# Patient Record
Sex: Female | Born: 1935 | Race: White | Hispanic: No | Marital: Married | Smoking: Never smoker
Health system: Southern US, Community
[De-identification: ages and names within clinical notes are randomized; demographics above are authoritative.]

## PROBLEM LIST (undated history)

## (undated) DIAGNOSIS — F32A Depression, unspecified: Secondary | ICD-10-CM

## (undated) DIAGNOSIS — F329 Major depressive disorder, single episode, unspecified: Secondary | ICD-10-CM

## (undated) DIAGNOSIS — G709 Myoneural disorder, unspecified: Secondary | ICD-10-CM

## (undated) DIAGNOSIS — R0602 Shortness of breath: Secondary | ICD-10-CM

## (undated) DIAGNOSIS — J45909 Unspecified asthma, uncomplicated: Secondary | ICD-10-CM

## (undated) DIAGNOSIS — M1990 Unspecified osteoarthritis, unspecified site: Secondary | ICD-10-CM

## (undated) DIAGNOSIS — D649 Anemia, unspecified: Secondary | ICD-10-CM

## (undated) DIAGNOSIS — C801 Malignant (primary) neoplasm, unspecified: Secondary | ICD-10-CM

## (undated) DIAGNOSIS — I10 Essential (primary) hypertension: Secondary | ICD-10-CM

## (undated) DIAGNOSIS — I272 Pulmonary hypertension, unspecified: Secondary | ICD-10-CM

## (undated) DIAGNOSIS — F458 Other somatoform disorders: Secondary | ICD-10-CM

## (undated) DIAGNOSIS — G4730 Sleep apnea, unspecified: Secondary | ICD-10-CM

## (undated) HISTORY — PX: BLEPHAROPLASTY: SUR158

## (undated) HISTORY — PX: JOINT REPLACEMENT: SHX530

## (undated) HISTORY — PX: ABDOMINAL HYSTERECTOMY: SHX81

## (undated) HISTORY — PX: TONSILLECTOMY: SUR1361

---

## 2001-11-11 HISTORY — PX: BACK SURGERY: SHX140

## 2002-01-20 ENCOUNTER — Encounter: Admission: RE | Admit: 2002-01-20 | Discharge: 2002-01-20 | Payer: Self-pay | Admitting: Neurosurgery

## 2002-01-20 ENCOUNTER — Encounter: Payer: Self-pay | Admitting: Neurosurgery

## 2002-02-01 ENCOUNTER — Encounter: Payer: Self-pay | Admitting: Neurosurgery

## 2002-02-01 ENCOUNTER — Encounter: Admission: RE | Admit: 2002-02-01 | Discharge: 2002-02-01 | Payer: Self-pay | Admitting: Neurosurgery

## 2002-02-23 ENCOUNTER — Encounter: Admission: RE | Admit: 2002-02-23 | Discharge: 2002-02-23 | Payer: Self-pay | Admitting: Neurosurgery

## 2002-02-23 ENCOUNTER — Encounter: Payer: Self-pay | Admitting: Neurosurgery

## 2002-04-20 ENCOUNTER — Encounter: Payer: Self-pay | Admitting: Neurosurgery

## 2002-04-26 ENCOUNTER — Encounter: Payer: Self-pay | Admitting: Neurosurgery

## 2002-04-26 ENCOUNTER — Inpatient Hospital Stay (HOSPITAL_COMMUNITY): Admission: RE | Admit: 2002-04-26 | Discharge: 2002-04-27 | Payer: Self-pay | Admitting: Neurosurgery

## 2002-07-30 ENCOUNTER — Encounter: Admission: RE | Admit: 2002-07-30 | Discharge: 2002-07-30 | Payer: Self-pay | Admitting: Neurosurgery

## 2002-07-30 ENCOUNTER — Encounter: Admission: RE | Admit: 2002-07-30 | Discharge: 2002-07-30 | Payer: Self-pay | Admitting: Orthopedic Surgery

## 2002-07-30 ENCOUNTER — Encounter: Payer: Self-pay | Admitting: Neurosurgery

## 2002-08-12 ENCOUNTER — Encounter: Admission: RE | Admit: 2002-08-12 | Discharge: 2002-08-12 | Payer: Self-pay | Admitting: Neurosurgery

## 2002-08-12 ENCOUNTER — Encounter: Payer: Self-pay | Admitting: Neurosurgery

## 2002-11-22 ENCOUNTER — Encounter: Payer: Self-pay | Admitting: Neurosurgery

## 2002-11-22 ENCOUNTER — Encounter: Admission: RE | Admit: 2002-11-22 | Discharge: 2002-11-22 | Payer: Self-pay | Admitting: Neurosurgery

## 2004-11-11 HISTORY — PX: KNEE SURGERY: SHX244

## 2005-03-12 ENCOUNTER — Ambulatory Visit: Payer: Self-pay | Admitting: Internal Medicine

## 2005-08-26 ENCOUNTER — Inpatient Hospital Stay: Payer: Self-pay | Admitting: General Practice

## 2005-10-16 ENCOUNTER — Ambulatory Visit: Payer: Self-pay | Admitting: General Practice

## 2006-03-24 ENCOUNTER — Ambulatory Visit: Payer: Self-pay | Admitting: Internal Medicine

## 2006-07-29 ENCOUNTER — Ambulatory Visit: Payer: Self-pay

## 2006-09-04 ENCOUNTER — Ambulatory Visit: Payer: Self-pay | Admitting: Pain Medicine

## 2006-09-10 ENCOUNTER — Ambulatory Visit: Payer: Self-pay | Admitting: Pain Medicine

## 2006-10-16 ENCOUNTER — Ambulatory Visit: Payer: Self-pay | Admitting: Pain Medicine

## 2006-10-22 ENCOUNTER — Ambulatory Visit: Payer: Self-pay | Admitting: Pain Medicine

## 2007-01-08 ENCOUNTER — Ambulatory Visit: Payer: Self-pay | Admitting: Pain Medicine

## 2007-05-07 ENCOUNTER — Ambulatory Visit: Payer: Self-pay | Admitting: Internal Medicine

## 2008-05-10 ENCOUNTER — Ambulatory Visit: Payer: Self-pay | Admitting: Internal Medicine

## 2008-05-23 ENCOUNTER — Ambulatory Visit: Payer: Self-pay | Admitting: Internal Medicine

## 2008-11-11 DIAGNOSIS — C801 Malignant (primary) neoplasm, unspecified: Secondary | ICD-10-CM

## 2008-11-11 HISTORY — DX: Malignant (primary) neoplasm, unspecified: C80.1

## 2008-11-25 ENCOUNTER — Ambulatory Visit: Payer: Self-pay | Admitting: Surgery

## 2009-01-30 ENCOUNTER — Ambulatory Visit: Payer: Self-pay | Admitting: Internal Medicine

## 2009-01-31 ENCOUNTER — Ambulatory Visit: Payer: Self-pay | Admitting: Internal Medicine

## 2009-02-09 ENCOUNTER — Ambulatory Visit: Payer: Self-pay | Admitting: Gynecologic Oncology

## 2009-02-14 ENCOUNTER — Ambulatory Visit: Payer: Self-pay | Admitting: Gynecologic Oncology

## 2009-02-20 ENCOUNTER — Inpatient Hospital Stay: Payer: Self-pay | Admitting: Internal Medicine

## 2009-03-11 ENCOUNTER — Ambulatory Visit: Payer: Self-pay | Admitting: Gynecologic Oncology

## 2009-03-13 ENCOUNTER — Ambulatory Visit: Payer: Self-pay | Admitting: Surgery

## 2009-03-15 ENCOUNTER — Ambulatory Visit: Payer: Self-pay | Admitting: Oncology

## 2009-04-11 ENCOUNTER — Ambulatory Visit: Payer: Self-pay | Admitting: Oncology

## 2009-04-11 ENCOUNTER — Ambulatory Visit: Payer: Self-pay | Admitting: Gynecologic Oncology

## 2009-05-11 ENCOUNTER — Ambulatory Visit: Payer: Self-pay | Admitting: Gynecologic Oncology

## 2009-05-11 ENCOUNTER — Ambulatory Visit: Payer: Self-pay | Admitting: Oncology

## 2009-05-22 ENCOUNTER — Ambulatory Visit: Payer: Self-pay | Admitting: Internal Medicine

## 2009-06-11 ENCOUNTER — Ambulatory Visit: Payer: Self-pay | Admitting: Oncology

## 2009-06-11 ENCOUNTER — Ambulatory Visit: Payer: Self-pay | Admitting: Gynecologic Oncology

## 2009-07-12 ENCOUNTER — Ambulatory Visit: Payer: Self-pay | Admitting: Oncology

## 2009-07-12 ENCOUNTER — Ambulatory Visit: Payer: Self-pay | Admitting: Gynecologic Oncology

## 2009-08-11 ENCOUNTER — Ambulatory Visit: Payer: Self-pay | Admitting: Gynecologic Oncology

## 2009-08-11 ENCOUNTER — Ambulatory Visit: Payer: Self-pay | Admitting: Oncology

## 2009-09-11 ENCOUNTER — Ambulatory Visit: Payer: Self-pay | Admitting: Gynecologic Oncology

## 2009-09-11 ENCOUNTER — Ambulatory Visit: Payer: Self-pay | Admitting: Oncology

## 2009-10-11 ENCOUNTER — Ambulatory Visit: Payer: Self-pay | Admitting: Oncology

## 2009-11-11 ENCOUNTER — Ambulatory Visit: Payer: Self-pay | Admitting: Oncology

## 2009-12-12 ENCOUNTER — Ambulatory Visit: Payer: Self-pay | Admitting: Oncology

## 2009-12-25 ENCOUNTER — Ambulatory Visit: Payer: Self-pay | Admitting: Oncology

## 2010-01-09 ENCOUNTER — Ambulatory Visit: Payer: Self-pay | Admitting: Oncology

## 2010-02-09 ENCOUNTER — Ambulatory Visit: Payer: Self-pay | Admitting: Oncology

## 2010-03-11 ENCOUNTER — Ambulatory Visit: Payer: Self-pay | Admitting: Oncology

## 2010-03-19 ENCOUNTER — Ambulatory Visit: Payer: Self-pay | Admitting: Oncology

## 2010-03-26 ENCOUNTER — Ambulatory Visit: Payer: Self-pay | Admitting: Oncology

## 2010-04-11 ENCOUNTER — Ambulatory Visit: Payer: Self-pay | Admitting: Oncology

## 2010-04-25 ENCOUNTER — Ambulatory Visit: Payer: Self-pay | Admitting: Internal Medicine

## 2010-05-16 ENCOUNTER — Ambulatory Visit: Payer: Self-pay | Admitting: Pain Medicine

## 2010-05-29 ENCOUNTER — Ambulatory Visit: Payer: Self-pay | Admitting: Pain Medicine

## 2010-06-05 ENCOUNTER — Ambulatory Visit: Payer: Self-pay | Admitting: Internal Medicine

## 2010-06-11 ENCOUNTER — Ambulatory Visit: Payer: Self-pay | Admitting: Pain Medicine

## 2010-06-26 ENCOUNTER — Ambulatory Visit: Payer: Self-pay | Admitting: Oncology

## 2010-06-26 ENCOUNTER — Ambulatory Visit: Payer: Self-pay | Admitting: Gynecologic Oncology

## 2010-06-28 LAB — CA 125: CA 125: 13.5 U/mL (ref 0.0–34.0)

## 2010-07-10 ENCOUNTER — Ambulatory Visit: Payer: Self-pay | Admitting: Pain Medicine

## 2010-07-12 ENCOUNTER — Ambulatory Visit: Payer: Self-pay | Admitting: Gynecologic Oncology

## 2010-07-24 ENCOUNTER — Ambulatory Visit: Payer: Self-pay | Admitting: Pain Medicine

## 2010-08-06 ENCOUNTER — Ambulatory Visit: Payer: Self-pay | Admitting: Pain Medicine

## 2010-09-27 ENCOUNTER — Ambulatory Visit: Payer: Self-pay | Admitting: Gynecologic Oncology

## 2010-10-02 ENCOUNTER — Ambulatory Visit: Payer: Self-pay | Admitting: Oncology

## 2010-10-07 ENCOUNTER — Observation Stay: Payer: Self-pay | Admitting: General Surgery

## 2010-10-11 ENCOUNTER — Ambulatory Visit: Payer: Self-pay | Admitting: Oncology

## 2010-12-21 ENCOUNTER — Inpatient Hospital Stay: Payer: Self-pay | Admitting: Internal Medicine

## 2011-01-03 ENCOUNTER — Ambulatory Visit: Payer: Self-pay | Admitting: Oncology

## 2011-01-10 ENCOUNTER — Ambulatory Visit: Payer: Self-pay | Admitting: Oncology

## 2011-03-19 ENCOUNTER — Ambulatory Visit: Payer: Self-pay | Admitting: Gastroenterology

## 2011-03-21 ENCOUNTER — Ambulatory Visit: Payer: Self-pay | Admitting: Pain Medicine

## 2011-04-01 ENCOUNTER — Other Ambulatory Visit: Payer: Self-pay | Admitting: Pain Medicine

## 2011-04-02 ENCOUNTER — Ambulatory Visit: Payer: Self-pay | Admitting: Oncology

## 2011-04-10 ENCOUNTER — Ambulatory Visit: Payer: Self-pay | Admitting: Pain Medicine

## 2011-04-12 ENCOUNTER — Ambulatory Visit: Payer: Self-pay | Admitting: Oncology

## 2011-05-13 ENCOUNTER — Encounter: Payer: Self-pay | Admitting: Internal Medicine

## 2011-06-05 ENCOUNTER — Other Ambulatory Visit: Payer: Self-pay | Admitting: Pain Medicine

## 2011-06-10 ENCOUNTER — Ambulatory Visit: Payer: Self-pay | Admitting: Pain Medicine

## 2011-06-19 ENCOUNTER — Ambulatory Visit: Payer: Self-pay | Admitting: Pain Medicine

## 2011-06-20 ENCOUNTER — Ambulatory Visit: Payer: Self-pay | Admitting: Pain Medicine

## 2011-07-04 ENCOUNTER — Ambulatory Visit: Payer: Self-pay | Admitting: Oncology

## 2011-07-05 LAB — CA 125: CA 125: 13.6 U/mL (ref 0.0–34.0)

## 2011-07-08 ENCOUNTER — Ambulatory Visit: Payer: Self-pay | Admitting: Pain Medicine

## 2011-07-09 ENCOUNTER — Ambulatory Visit: Payer: Self-pay | Admitting: Pain Medicine

## 2011-07-13 ENCOUNTER — Ambulatory Visit: Payer: Self-pay | Admitting: Oncology

## 2011-07-24 ENCOUNTER — Ambulatory Visit: Payer: Self-pay | Admitting: Pain Medicine

## 2011-09-10 ENCOUNTER — Ambulatory Visit: Payer: Self-pay | Admitting: Internal Medicine

## 2011-10-08 ENCOUNTER — Ambulatory Visit: Payer: Self-pay | Admitting: Oncology

## 2011-10-12 ENCOUNTER — Ambulatory Visit: Payer: Self-pay | Admitting: Oncology

## 2011-11-12 ENCOUNTER — Ambulatory Visit: Payer: Self-pay | Admitting: Oncology

## 2011-11-12 HISTORY — PX: TOTAL HIP ARTHROPLASTY: SHX124

## 2011-11-12 HISTORY — PX: LAPAROTOMY: SHX154

## 2011-12-12 ENCOUNTER — Ambulatory Visit: Payer: Self-pay | Admitting: Pain Medicine

## 2012-01-01 ENCOUNTER — Ambulatory Visit: Payer: Self-pay | Admitting: Pain Medicine

## 2012-01-02 ENCOUNTER — Ambulatory Visit: Payer: Self-pay | Admitting: Pain Medicine

## 2012-01-06 ENCOUNTER — Ambulatory Visit: Payer: Self-pay | Admitting: Oncology

## 2012-01-06 LAB — CBC CANCER CENTER
Basophil #: 0 x10 3/mm (ref 0.0–0.1)
Basophil %: 0.1 %
HCT: 37.9 % (ref 35.0–47.0)
HGB: 12.9 g/dL (ref 12.0–16.0)
Lymphocyte %: 12.6 %
MCHC: 33.9 g/dL (ref 32.0–36.0)
Monocyte %: 5.8 %
Neutrophil #: 7.1 x10 3/mm — ABNORMAL HIGH (ref 1.4–6.5)
Neutrophil %: 80.4 %
RDW: 13.8 % (ref 11.5–14.5)
WBC: 8.8 x10 3/mm (ref 3.6–11.0)

## 2012-01-06 LAB — COMPREHENSIVE METABOLIC PANEL
Albumin: 3.9 g/dL (ref 3.4–5.0)
BUN: 37 mg/dL — ABNORMAL HIGH (ref 7–18)
Bilirubin,Total: 0.6 mg/dL (ref 0.2–1.0)
Calcium, Total: 9.1 mg/dL (ref 8.5–10.1)
Chloride: 98 mmol/L (ref 98–107)
Co2: 33 mmol/L — ABNORMAL HIGH (ref 21–32)
EGFR (African American): 59 — ABNORMAL LOW
Glucose: 113 mg/dL — ABNORMAL HIGH (ref 65–99)
SGOT(AST): 19 U/L (ref 15–37)
SGPT (ALT): 32 U/L
Total Protein: 8.1 g/dL (ref 6.4–8.2)

## 2012-01-07 LAB — CA 125: CA 125: 15.3 U/mL (ref 0.0–34.0)

## 2012-01-10 ENCOUNTER — Ambulatory Visit: Payer: Self-pay | Admitting: Oncology

## 2012-02-20 ENCOUNTER — Ambulatory Visit: Payer: Self-pay | Admitting: Pain Medicine

## 2012-04-07 ENCOUNTER — Ambulatory Visit: Payer: Self-pay | Admitting: Oncology

## 2012-04-08 LAB — CA 125: CA 125: 14.9 U/mL (ref 0.0–34.0)

## 2012-04-11 ENCOUNTER — Ambulatory Visit: Payer: Self-pay | Admitting: Oncology

## 2012-04-20 ENCOUNTER — Ambulatory Visit: Payer: Self-pay | Admitting: General Practice

## 2012-04-20 LAB — BASIC METABOLIC PANEL
BUN: 27 mg/dL — ABNORMAL HIGH (ref 7–18)
Calcium, Total: 9.5 mg/dL (ref 8.5–10.1)
Chloride: 97 mmol/L — ABNORMAL LOW (ref 98–107)
Co2: 34 mmol/L — ABNORMAL HIGH (ref 21–32)
Creatinine: 1.05 mg/dL (ref 0.60–1.30)
EGFR (Non-African Amer.): 52 — ABNORMAL LOW
Glucose: 117 mg/dL — ABNORMAL HIGH (ref 65–99)
Osmolality: 282 (ref 275–301)
Potassium: 3.3 mmol/L — ABNORMAL LOW (ref 3.5–5.1)

## 2012-04-20 LAB — URINALYSIS, COMPLETE
Bilirubin,UR: NEGATIVE
Blood: NEGATIVE
Glucose,UR: NEGATIVE mg/dL (ref 0–75)
Leukocyte Esterase: NEGATIVE
Nitrite: NEGATIVE
Ph: 5 (ref 4.5–8.0)
Protein: NEGATIVE
Squamous Epithelial: <1
WBC UR: 1 /HPF (ref 0–5)

## 2012-04-20 LAB — CBC
HCT: 38.8 % (ref 35.0–47.0)
MCH: 29.9 pg (ref 26.0–34.0)
MCV: 91 fL (ref 80–100)
Platelet: 228 10*3/uL (ref 150–440)
RDW: 13.3 % (ref 11.5–14.5)

## 2012-04-20 LAB — MRSA PCR SCREENING

## 2012-04-20 LAB — APTT: Activated PTT: 34.6 secs (ref 23.6–35.9)

## 2012-05-04 ENCOUNTER — Inpatient Hospital Stay: Payer: Self-pay | Admitting: General Practice

## 2012-05-04 LAB — POTASSIUM: Potassium: 3.7 mmol/L (ref 3.5–5.1)

## 2012-05-05 LAB — HEMOGLOBIN: HGB: 9.3 g/dL — ABNORMAL LOW (ref 12.0–16.0)

## 2012-05-05 LAB — BASIC METABOLIC PANEL
Calcium, Total: 8.3 mg/dL — ABNORMAL LOW (ref 8.5–10.1)
Creatinine: 1.14 mg/dL (ref 0.60–1.30)
EGFR (African American): 54 — ABNORMAL LOW
EGFR (Non-African Amer.): 47 — ABNORMAL LOW
Osmolality: 275 (ref 275–301)
Potassium: 4.1 mmol/L (ref 3.5–5.1)
Sodium: 136 mmol/L (ref 136–145)

## 2012-05-06 LAB — BASIC METABOLIC PANEL
BUN: 21 mg/dL — ABNORMAL HIGH (ref 7–18)
Calcium, Total: 7.8 mg/dL — ABNORMAL LOW (ref 8.5–10.1)
Chloride: 100 mmol/L (ref 98–107)
Creatinine: 0.99 mg/dL (ref 0.60–1.30)
EGFR (Non-African Amer.): 56 — ABNORMAL LOW
Glucose: 135 mg/dL — ABNORMAL HIGH (ref 65–99)

## 2012-05-06 LAB — HEMOGLOBIN: HGB: 8.7 g/dL — ABNORMAL LOW (ref 12.0–16.0)

## 2012-05-06 LAB — PLATELET COUNT: Platelet: 174 10*3/uL (ref 150–440)

## 2012-05-06 LAB — PATHOLOGY REPORT

## 2012-05-07 LAB — URINALYSIS, COMPLETE
Glucose,UR: NEGATIVE mg/dL (ref 0–75)
Ketone: NEGATIVE
Nitrite: NEGATIVE
Ph: 7 (ref 4.5–8.0)
Protein: NEGATIVE
RBC,UR: 1 /HPF (ref 0–5)
Specific Gravity: 1.012 (ref 1.003–1.030)
Transitional Epi: 1
WBC UR: 9 /HPF (ref 0–5)

## 2012-05-08 ENCOUNTER — Encounter: Payer: Self-pay | Admitting: Internal Medicine

## 2012-05-11 ENCOUNTER — Encounter: Payer: Self-pay | Admitting: Internal Medicine

## 2012-05-12 LAB — BASIC METABOLIC PANEL
Anion Gap: 6 — ABNORMAL LOW (ref 7–16)
Calcium, Total: 8.6 mg/dL (ref 8.5–10.1)
Chloride: 96 mmol/L — ABNORMAL LOW (ref 98–107)
Co2: 36 mmol/L — ABNORMAL HIGH (ref 21–32)
Creatinine: 1.18 mg/dL (ref 0.60–1.30)
EGFR (African American): 52 — ABNORMAL LOW
Potassium: 3.7 mmol/L (ref 3.5–5.1)
Sodium: 138 mmol/L (ref 136–145)

## 2012-07-03 ENCOUNTER — Ambulatory Visit: Payer: Self-pay | Admitting: Oncology

## 2012-07-03 LAB — COMPREHENSIVE METABOLIC PANEL
Bilirubin,Total: 0.2 mg/dL (ref 0.2–1.0)
Chloride: 102 mmol/L (ref 98–107)
Co2: 34 mmol/L — ABNORMAL HIGH (ref 21–32)
Creatinine: 1.07 mg/dL (ref 0.60–1.30)
EGFR (African American): 58 — ABNORMAL LOW
EGFR (Non-African Amer.): 50 — ABNORMAL LOW
Glucose: 87 mg/dL (ref 65–99)
Osmolality: 283 (ref 275–301)
Potassium: 3.7 mmol/L (ref 3.5–5.1)
Sodium: 140 mmol/L (ref 136–145)

## 2012-07-03 LAB — CBC CANCER CENTER
Basophil %: 0.7 %
HCT: 34.4 % — ABNORMAL LOW (ref 35.0–47.0)
HGB: 11.2 g/dL — ABNORMAL LOW (ref 12.0–16.0)
Lymphocyte %: 21.9 %
MCV: 89 fL (ref 80–100)
Monocyte %: 8.1 %
Neutrophil #: 3.4 x10 3/mm (ref 1.4–6.5)
WBC: 5.4 x10 3/mm (ref 3.6–11.0)

## 2012-07-12 ENCOUNTER — Ambulatory Visit: Payer: Self-pay | Admitting: Oncology

## 2012-07-14 LAB — CBC CANCER CENTER
Basophil #: 0 x10 3/mm (ref 0.0–0.1)
Basophil %: 0.5 %
Eosinophil #: 0.3 x10 3/mm (ref 0.0–0.7)
Eosinophil %: 5.4 %
HGB: 10.8 g/dL — ABNORMAL LOW (ref 12.0–16.0)
Lymphocyte %: 19.4 %
MCHC: 31.8 g/dL — ABNORMAL LOW (ref 32.0–36.0)
Monocyte %: 6.5 %
Neutrophil #: 4.4 x10 3/mm (ref 1.4–6.5)
Neutrophil %: 68.2 %
RDW: 14.4 % (ref 11.5–14.5)
WBC: 6.4 x10 3/mm (ref 3.6–11.0)

## 2012-07-14 LAB — COMPREHENSIVE METABOLIC PANEL
Albumin: 3.2 g/dL — ABNORMAL LOW (ref 3.4–5.0)
Alkaline Phosphatase: 82 U/L (ref 50–136)
Anion Gap: 6 — ABNORMAL LOW (ref 7–16)
BUN: 23 mg/dL — ABNORMAL HIGH (ref 7–18)
Bilirubin,Total: 0.2 mg/dL (ref 0.2–1.0)
Creatinine: 0.94 mg/dL (ref 0.60–1.30)
EGFR (African American): >60
Glucose: 103 mg/dL — ABNORMAL HIGH (ref 65–99)
Potassium: 4.1 mmol/L (ref 3.5–5.1)
SGOT(AST): 17 U/L (ref 15–37)
SGPT (ALT): 21 U/L (ref 12–78)
Total Protein: 7.3 g/dL (ref 6.4–8.2)

## 2012-07-14 LAB — PROTIME-INR: INR: 1

## 2012-07-17 LAB — CA 125: CA 125: 15.4 U/mL (ref 0.0–34.0)

## 2012-07-20 ENCOUNTER — Other Ambulatory Visit: Payer: Self-pay

## 2012-07-20 ENCOUNTER — Telehealth: Payer: Self-pay

## 2012-07-20 DIAGNOSIS — R590 Localized enlarged lymph nodes: Secondary | ICD-10-CM

## 2012-07-20 NOTE — Telephone Encounter (Signed)
Pt has been scheduled for 08/06/12 1030 am need to review meds and instruct pt

## 2012-07-20 NOTE — Telephone Encounter (Signed)
Pt has been instructed and meds reviewed.  She will call with any questions or concerns 

## 2012-07-29 ENCOUNTER — Telehealth: Payer: Self-pay

## 2012-07-29 NOTE — Telephone Encounter (Signed)
eus appt change to 08/20/12 1130 am

## 2012-07-29 NOTE — Telephone Encounter (Signed)
Pt has been re instructed with new appt date and time.

## 2012-07-30 ENCOUNTER — Telehealth: Payer: Self-pay | Admitting: Gastroenterology

## 2012-07-30 NOTE — Telephone Encounter (Signed)
Pt had questions about her procedure at the hospital all questions were answered and she will call with any concerns

## 2012-08-11 ENCOUNTER — Ambulatory Visit: Payer: Self-pay | Admitting: Oncology

## 2012-08-20 ENCOUNTER — Encounter (HOSPITAL_COMMUNITY): Payer: Self-pay | Admitting: Anesthesiology

## 2012-08-20 ENCOUNTER — Ambulatory Visit (HOSPITAL_COMMUNITY)
Admission: RE | Admit: 2012-08-20 | Discharge: 2012-08-20 | Disposition: A | Discharge status: Home / Self Care | Payer: Medicare Other | Source: Ambulatory Visit | Attending: Gastroenterology | Admitting: Gastroenterology

## 2012-08-20 ENCOUNTER — Encounter (HOSPITAL_COMMUNITY): Payer: Self-pay

## 2012-08-20 ENCOUNTER — Ambulatory Visit (HOSPITAL_COMMUNITY): Payer: Medicare Other | Admitting: Anesthesiology

## 2012-08-20 ENCOUNTER — Encounter (HOSPITAL_COMMUNITY)
Admission: RE | Disposition: A | Discharge status: Home / Self Care | Payer: Self-pay | Source: Ambulatory Visit | Attending: Gastroenterology

## 2012-08-20 DIAGNOSIS — R599 Enlarged lymph nodes, unspecified: Secondary | ICD-10-CM

## 2012-08-20 DIAGNOSIS — R590 Localized enlarged lymph nodes: Secondary | ICD-10-CM

## 2012-08-20 DIAGNOSIS — C569 Malignant neoplasm of unspecified ovary: Secondary | ICD-10-CM | POA: Insufficient documentation

## 2012-08-20 DIAGNOSIS — D649 Anemia, unspecified: Secondary | ICD-10-CM | POA: Insufficient documentation

## 2012-08-20 DIAGNOSIS — I10 Essential (primary) hypertension: Secondary | ICD-10-CM | POA: Insufficient documentation

## 2012-08-20 HISTORY — DX: Essential (primary) hypertension: I10

## 2012-08-20 HISTORY — DX: Anemia, unspecified: D64.9

## 2012-08-20 HISTORY — DX: Malignant (primary) neoplasm, unspecified: C80.1

## 2012-08-20 HISTORY — PX: EUS: SHX5427

## 2012-08-20 SURGERY — UPPER ENDOSCOPIC ULTRASOUND (EUS) LINEAR
Anesthesia: Monitor Anesthesia Care

## 2012-08-20 MED ORDER — LACTATED RINGERS IV SOLN
INTRAVENOUS | Status: DC | PRN
  Administered 2012-08-20: 12:00:00 via INTRAVENOUS

## 2012-08-20 MED ORDER — SODIUM CHLORIDE 0.9 % IV SOLN: INTRAVENOUS | Status: DC

## 2012-08-20 MED ORDER — PROPOFOL INFUSION 10 MG/ML OPTIME
INTRAVENOUS | Status: DC | PRN
  Administered 2012-08-20: 75 ug/kg/min via INTRAVENOUS

## 2012-08-20 MED ORDER — MIDAZOLAM HCL 5 MG/5ML IJ SOLN
INTRAMUSCULAR | Status: DC | PRN
  Administered 2012-08-20 (×2): 1 mg via INTRAVENOUS

## 2012-08-20 MED ORDER — KETAMINE HCL 10 MG/ML IJ SOLN
INTRAMUSCULAR | Status: DC | PRN
  Administered 2012-08-20: 20 mg via INTRAVENOUS

## 2012-08-20 MED ORDER — ONDANSETRON HCL 4 MG/2ML IJ SOLN
INTRAMUSCULAR | Status: DC | PRN
  Administered 2012-08-20: 4 mg via INTRAVENOUS

## 2012-08-20 MED ORDER — FENTANYL CITRATE 0.05 MG/ML IJ SOLN
INTRAMUSCULAR | Status: DC | PRN
  Administered 2012-08-20 (×2): 50 ug via INTRAVENOUS

## 2012-08-20 MED ORDER — BUTAMBEN-TETRACAINE-BENZOCAINE 2-2-14 % EX AERO
INHALATION_SPRAY | CUTANEOUS | Status: DC | PRN
  Administered 2012-08-20: 2 via TOPICAL

## 2012-08-20 MED ORDER — LACTATED RINGERS IV SOLN: INTRAVENOUS | Status: DC

## 2012-08-20 NOTE — Anesthesia Preprocedure Evaluation (Addendum)
Anesthesia Evaluation  Patient identified by MRN, date of birth, ID band Patient awake    Reviewed: Allergy & Precautions, H&P , NPO status , Patient's Chart, lab work & pertinent test results  Airway       Dental  (+) Dental Advisory Given   Pulmonary neg pulmonary ROS,          Cardiovascular hypertension, Pt. on medications     Neuro/Psych negative neurological ROS  negative psych ROS   GI/Hepatic Neg liver ROS,   Endo/Other  negative endocrine ROS  Renal/GU negative Renal ROS     Musculoskeletal negative musculoskeletal ROS (+)   Abdominal   Peds  Hematology  (+) Blood dyscrasia, anemia ,   Anesthesia Other Findings   Reproductive/Obstetrics                          Anesthesia Physical Anesthesia Plan  ASA: II  Anesthesia Plan: MAC   Post-op Pain Management:    Induction: Intravenous  Airway Management Planned: Simple Face Mask  Additional Equipment:   Intra-op Plan:   Post-operative Plan:   Informed Consent: I have reviewed the patients History and Physical, chart, labs and discussed the procedure including the risks, benefits and alternatives for the proposed anesthesia with the patient or authorized representative who has indicated his/her understanding and acceptance.   Dental advisory given  Plan Discussed with: CRNA  Anesthesia Plan Comments:        Anesthesia Quick Evaluation

## 2012-08-20 NOTE — Op Note (Signed)
[facility] [address] [place], [address]   ENDOSCOPIC ULTRASOUND PROCEDURE REPORT  PATIENT: Lisa Becker, Lisa Becker  MR#: [id number] BIRTHDATE: 1934-09-28  GENDER: Female ENDOSCOPIST: Rachael Fee, MD REFERRED BY:  Johney Maine, MD  [facility] PROCEDURE DATE:  08/20/2012 PROCEDURE:   Upper EUS ASA CLASS:      Class III INDICATIONS:   ovarian cancer diagnosed 2010; recent imaging suggesting increasing size of retroperitoneal lymphnodes (AortoCaval node). MEDICATIONS: MAC sedation, administered by CRNA  DESCRIPTION OF PROCEDURE:   After the risks benefits and alternatives of the procedure were  explained, informed consent was obtained. The patient was then placed in the left, lateral, decubitus postion and IV sedation was administered. Throughout the procedure, the patients blood pressure, pulse and oxygen saturations were monitored continuously.  Under direct visualization, the Pentax Radial EUS [id number]  endoscope was introduced through the mouth  and advanced to the second portion of the duodenum .  Water was used as necessary to provide an acoustic interface.  Upon completion of the imaging, water was removed and the patient was sent to the recovery room in satisfactory condition.   Endoscopic findings (with radial and linear echoendoscopes): 1. Normal UGI tract  EUS findings: 1. I could not visualize the enlarged Aorto Caval lymphnodes with either radial or linear echoendoscopes. 2. Limited views of liver, spleen, pancreas were all normal   Impression: The Aorto Caval lymphnodes noted to be increasing in size on recent imaging were not visible on this examination.  They are likely too deep relative to the UGI tract lumen.  I will communicate this with Dr. Doylene Canning.    _______________________________ eSigned:  Rachael Fee, MD 08/20/2012 12:41 PM

## 2012-08-20 NOTE — Transfer of Care (Signed)
.  Immediate Anesthesia Transfer of Care Note  Patient: Lisa Becker  Procedure(s) Performed: Procedure(s) (LRB): UPPER ENDOSCOPIC ULTRASOUND (EUS) LINEAR (N/A)  Patient Location: PACU  Anesthesia Type: MAC  Level of Consciousness: sedated, patient cooperative and responds to stimulaton  Airway & Oxygen Therapy: Patient Spontanous Breathing and Patient connected to face mask oxgen  Post-op Assessment: Report given to PACU RN and Post -op Vital signs reviewed and stable  Post vital signs: Reviewed and stable  Complications: No apparent anesthesia complications

## 2012-08-20 NOTE — Anesthesia Postprocedure Evaluation (Signed)
Anesthesia Post Note  Patient: Lisa Becker  Procedure(s) Performed: Procedure(s) (LRB): UPPER ENDOSCOPIC ULTRASOUND (EUS) LINEAR (N/A)  Anesthesia type: MAC  Patient location: PACU  Post pain: Pain level controlled  Post assessment: Post-op Vital signs reviewed  Last Vitals: BP 137/63  Pulse 70  Temp 37.1 C (Oral)  Resp 25  Ht 5' 1.5" (1.562 m)  Wt 200 lb (90.719 kg)  BMI 37.18 kg/m2  SpO2 98%  Post vital signs: Reviewed  Level of consciousness: awake  Complications: No apparent anesthesia complications

## 2012-08-20 NOTE — H&P (Signed)
  HPI: This is a woman with known metastatic ovarian cancer, enlarging retorperitoneal LNs, sent by oncologist for biopsy of LNs    No past medical history on file.  No past surgical history on file.  Current Facility-Administered Medications  Medication Dose Route Frequency Provider Last Rate Last Dose  . 0.9 %  sodium chloride infusion   Intravenous Continuous Rachael Fee, MD        Allergies as of 07/20/2012  . (No Known Allergies)    No family history on file.  History   Social History  . Marital Status: Married    Spouse Name: N/A    Number of Children: N/A  . Years of Education: N/A   Occupational History  . Not on file.   Social History Main Topics  . Smoking status: Not on file  . Smokeless tobacco: Not on file  . Alcohol Use: Not on file  . Drug Use: Not on file  . Sexually Active: Not on file   Other Topics Concern  . Not on file   Social History Narrative  . No narrative on file      Physical Exam: BP 133/64  Pulse 66  Temp 98.7 F (37.1 C) (Oral)  Resp 11  Ht 5' 1.5" (1.562 m)  Wt 200 lb (90.719 kg)  BMI 37.18 kg/m2  SpO2 99% Constitutional: generally well-appearing Psychiatric: alert and oriented x3 Abdomen: soft, nontender, nondistended, no obvious ascites, no peritoneal signs, normal bowel sounds     Assessment and plan: 78 y.o. female with ovarian cancer, metastatic  For EUS today, attempt retorperitoneal LN FNA

## 2012-08-24 ENCOUNTER — Encounter (HOSPITAL_COMMUNITY): Payer: Self-pay | Admitting: Gastroenterology

## 2012-09-02 LAB — APTT: Activated PTT: 30.3 secs (ref 23.6–35.9)

## 2012-09-03 ENCOUNTER — Ambulatory Visit: Payer: Self-pay | Admitting: Oncology

## 2012-09-11 ENCOUNTER — Ambulatory Visit: Payer: Self-pay | Admitting: Oncology

## 2012-09-17 ENCOUNTER — Ambulatory Visit: Payer: Self-pay | Admitting: Surgery

## 2012-09-24 ENCOUNTER — Inpatient Hospital Stay: Payer: Self-pay | Admitting: Surgery

## 2012-09-25 LAB — PATHOLOGY REPORT

## 2012-09-26 LAB — PLATELET COUNT: Platelet: 177 10*3/uL (ref 150–440)

## 2012-10-03 ENCOUNTER — Emergency Department: Payer: Self-pay | Admitting: Emergency Medicine

## 2012-10-03 LAB — CBC WITH DIFFERENTIAL/PLATELET
Eosinophil #: 0.3 10*3/uL (ref 0.0–0.7)
Eosinophil %: 3 %
HCT: 31.5 % — ABNORMAL LOW (ref 35.0–47.0)
HGB: 10.1 g/dL — ABNORMAL LOW (ref 12.0–16.0)
Lymphocyte #: 1 10*3/uL (ref 1.0–3.6)
MCHC: 32 g/dL (ref 32.0–36.0)
MCV: 87 fL (ref 80–100)
Monocyte #: 0.4 x10 3/mm (ref 0.2–0.9)
Monocyte %: 4.8 %
Platelet: 317 10*3/uL (ref 150–440)
RBC: 3.62 10*6/uL — ABNORMAL LOW (ref 3.80–5.20)
WBC: 8.6 10*3/uL (ref 3.6–11.0)

## 2012-10-03 LAB — URINALYSIS, COMPLETE
Ketone: NEGATIVE
Nitrite: NEGATIVE
Ph: 7 (ref 4.5–8.0)
Protein: NEGATIVE
Specific Gravity: 1.017 (ref 1.003–1.030)
Squamous Epithelial: 10
WBC UR: 34 /HPF (ref 0–5)

## 2012-10-03 LAB — COMPREHENSIVE METABOLIC PANEL
Albumin: 3.2 g/dL — ABNORMAL LOW (ref 3.4–5.0)
Anion Gap: 5 — ABNORMAL LOW (ref 7–16)
BUN: 22 mg/dL — ABNORMAL HIGH (ref 7–18)
Calcium, Total: 8.8 mg/dL (ref 8.5–10.1)
Co2: 32 mmol/L (ref 21–32)
EGFR (African American): >60
EGFR (Non-African Amer.): 57 — ABNORMAL LOW
Glucose: 117 mg/dL — ABNORMAL HIGH (ref 65–99)
Osmolality: 282 (ref 275–301)
Potassium: 4 mmol/L (ref 3.5–5.1)
SGOT(AST): 20 U/L (ref 15–37)

## 2012-10-11 ENCOUNTER — Ambulatory Visit: Payer: Self-pay | Admitting: Gynecologic Oncology

## 2012-10-12 ENCOUNTER — Ambulatory Visit: Payer: Self-pay | Admitting: Gynecologic Oncology

## 2012-10-15 ENCOUNTER — Ambulatory Visit: Payer: Self-pay | Admitting: Surgery

## 2012-10-19 LAB — CBC CANCER CENTER
Basophil #: 0 x10 3/mm (ref 0.0–0.1)
Basophil %: 0.5 %
Eosinophil #: 0.4 x10 3/mm (ref 0.0–0.7)
Eosinophil %: 8 %
HCT: 31.5 % — ABNORMAL LOW (ref 35.0–47.0)
HGB: 10.7 g/dL — ABNORMAL LOW (ref 12.0–16.0)
MCHC: 33.9 g/dL (ref 32.0–36.0)
Monocyte #: 0.4 x10 3/mm (ref 0.2–0.9)
Monocyte %: 8.7 %
Neutrophil #: 2.8 x10 3/mm (ref 1.4–6.5)
Platelet: 213 x10 3/mm (ref 150–440)
RBC: 3.68 10*6/uL — ABNORMAL LOW (ref 3.80–5.20)
RDW: 13.2 % (ref 11.5–14.5)

## 2012-10-19 LAB — COMPREHENSIVE METABOLIC PANEL
Albumin: 3.2 g/dL — ABNORMAL LOW (ref 3.4–5.0)
Alkaline Phosphatase: 74 U/L (ref 50–136)
BUN: 23 mg/dL — ABNORMAL HIGH (ref 7–18)
Bilirubin,Total: 0.2 mg/dL (ref 0.2–1.0)
Co2: 32 mmol/L (ref 21–32)
Creatinine: 1.14 mg/dL (ref 0.60–1.30)
EGFR (African American): 54 — ABNORMAL LOW
EGFR (Non-African Amer.): 47 — ABNORMAL LOW
Glucose: 99 mg/dL (ref 65–99)
SGOT(AST): 17 U/L (ref 15–37)
SGPT (ALT): 20 U/L (ref 12–78)
Sodium: 143 mmol/L (ref 136–145)
Total Protein: 6.9 g/dL (ref 6.4–8.2)

## 2012-10-26 LAB — CBC CANCER CENTER
Eosinophil %: 1.2 %
Lymphocyte #: 1.6 x10 3/mm (ref 1.0–3.6)
Lymphocyte %: 46.2 %
MCH: 28.6 pg (ref 26.0–34.0)
MCV: 85 fL (ref 80–100)
Monocyte #: 0.2 x10 3/mm (ref 0.2–0.9)
Neutrophil %: 46.6 %
Platelet: 149 x10 3/mm — ABNORMAL LOW (ref 150–440)
RBC: 4.15 10*6/uL (ref 3.80–5.20)

## 2012-11-02 LAB — CBC CANCER CENTER
Basophil #: 0 x10 3/mm (ref 0.0–0.1)
Basophil %: 0.7 %
Eosinophil %: 2 %
HCT: 32.5 % — ABNORMAL LOW (ref 35.0–47.0)
HGB: 10.9 g/dL — ABNORMAL LOW (ref 12.0–16.0)
Lymphocyte #: 1.3 x10 3/mm (ref 1.0–3.6)
Lymphocyte %: 69.2 %
MCH: 28.4 pg (ref 26.0–34.0)
MCV: 85 fL (ref 80–100)
Monocyte %: 1.1 %
Neutrophil #: 0.5 x10 3/mm — ABNORMAL LOW (ref 1.4–6.5)
Platelet: 40 x10 3/mm — ABNORMAL LOW (ref 150–440)
RBC: 3.85 10*6/uL (ref 3.80–5.20)
WBC: 1.8 x10 3/mm — CL (ref 3.6–11.0)

## 2012-11-09 LAB — CBC CANCER CENTER
Basophil #: 0 x10 3/mm (ref 0.0–0.1)
Eosinophil #: 0.1 x10 3/mm (ref 0.0–0.7)
HCT: 31 % — ABNORMAL LOW (ref 35.0–47.0)
MCV: 86 fL (ref 80–100)
Monocyte #: 0.3 x10 3/mm (ref 0.2–0.9)
Neutrophil %: 19.6 %
RDW: 13.7 % (ref 11.5–14.5)
WBC: 2.4 x10 3/mm — ABNORMAL LOW (ref 3.6–11.0)

## 2012-11-11 ENCOUNTER — Ambulatory Visit: Payer: Self-pay | Admitting: Gynecologic Oncology

## 2012-11-16 LAB — CBC CANCER CENTER
Basophil %: 0.7 %
HCT: 30.7 % — ABNORMAL LOW (ref 35.0–47.0)
HGB: 10.3 g/dL — ABNORMAL LOW (ref 12.0–16.0)
Lymphocyte #: 1.4 x10 3/mm (ref 1.0–3.6)
MCHC: 33.7 g/dL (ref 32.0–36.0)
Monocyte #: 0.6 x10 3/mm (ref 0.2–0.9)
Monocyte %: 16.8 %
Neutrophil #: 1.3 x10 3/mm — ABNORMAL LOW (ref 1.4–6.5)
Neutrophil %: 39.8 %
RBC: 3.51 10*6/uL — ABNORMAL LOW (ref 3.80–5.20)
RDW: 15.2 % — ABNORMAL HIGH (ref 11.5–14.5)
WBC: 3.3 x10 3/mm — ABNORMAL LOW (ref 3.6–11.0)

## 2012-11-16 LAB — COMPREHENSIVE METABOLIC PANEL
BUN: 19 mg/dL — ABNORMAL HIGH (ref 7–18)
Bilirubin,Total: 0.2 mg/dL (ref 0.2–1.0)
Calcium, Total: 8.4 mg/dL — ABNORMAL LOW (ref 8.5–10.1)
Chloride: 99 mmol/L (ref 98–107)
Co2: 33 mmol/L — ABNORMAL HIGH (ref 21–32)
Creatinine: 0.99 mg/dL (ref 0.60–1.30)
EGFR (African American): >60
EGFR (Non-African Amer.): 55 — ABNORMAL LOW
Osmolality: 285 (ref 275–301)
Potassium: 3.3 mmol/L — ABNORMAL LOW (ref 3.5–5.1)
SGOT(AST): 20 U/L (ref 15–37)
SGPT (ALT): 23 U/L (ref 12–78)
Total Protein: 6.9 g/dL (ref 6.4–8.2)

## 2012-11-23 LAB — CBC CANCER CENTER
Basophil #: 0 x10 3/mm (ref 0.0–0.1)
Eosinophil #: 0 x10 3/mm (ref 0.0–0.7)
HCT: 32.6 % — ABNORMAL LOW (ref 35.0–47.0)
HGB: 11.2 g/dL — ABNORMAL LOW (ref 12.0–16.0)
Lymphocyte #: 1.2 x10 3/mm (ref 1.0–3.6)
Lymphocyte %: 35 %
MCHC: 34.4 g/dL (ref 32.0–36.0)
MCV: 86 fL (ref 80–100)
Monocyte #: 0.2 x10 3/mm (ref 0.2–0.9)
Neutrophil #: 2 x10 3/mm (ref 1.4–6.5)
Neutrophil %: 57.7 %
Platelet: 161 x10 3/mm (ref 150–440)
RDW: 15.8 % — ABNORMAL HIGH (ref 11.5–14.5)
WBC: 3.5 x10 3/mm — ABNORMAL LOW (ref 3.6–11.0)

## 2012-11-30 LAB — CBC CANCER CENTER
Basophil #: 0 x10 3/mm (ref 0.0–0.1)
Eosinophil %: 2.7 %
HCT: 31.5 % — ABNORMAL LOW (ref 35.0–47.0)
HGB: 10.7 g/dL — ABNORMAL LOW (ref 12.0–16.0)
MCH: 29.7 pg (ref 26.0–34.0)
Monocyte #: 0.1 x10 3/mm — ABNORMAL LOW (ref 0.2–0.9)
Monocyte %: 3.5 %
Neutrophil %: 57.7 %
Platelet: 55 x10 3/mm — ABNORMAL LOW (ref 150–440)
RBC: 3.61 10*6/uL — ABNORMAL LOW (ref 3.80–5.20)
WBC: 2.9 x10 3/mm — ABNORMAL LOW (ref 3.6–11.0)

## 2012-12-07 LAB — CBC CANCER CENTER
Basophil #: 0 x10 3/mm (ref 0.0–0.1)
Basophil %: 0.4 %
Eosinophil %: 4.9 %
HCT: 30.1 % — ABNORMAL LOW (ref 35.0–47.0)
HGB: 10.2 g/dL — ABNORMAL LOW (ref 12.0–16.0)
Lymphocyte %: 34.7 %
MCH: 29.9 pg (ref 26.0–34.0)
MCV: 89 fL (ref 80–100)
Monocyte #: 0.5 x10 3/mm (ref 0.2–0.9)
Monocyte %: 13.7 %
Platelet: 236 x10 3/mm (ref 150–440)
RDW: 19.2 % — ABNORMAL HIGH (ref 11.5–14.5)

## 2012-12-12 ENCOUNTER — Ambulatory Visit: Payer: Self-pay | Admitting: Gynecologic Oncology

## 2012-12-14 LAB — CBC CANCER CENTER
Basophil #: 0 x10 3/mm (ref 0.0–0.1)
Basophil %: 0.7 %
Eosinophil #: 0.2 x10 3/mm (ref 0.0–0.7)
Eosinophil %: 3.1 %
HCT: 30.9 % — ABNORMAL LOW (ref 35.0–47.0)
Lymphocyte #: 1.5 x10 3/mm (ref 1.0–3.6)
Lymphocyte %: 30.7 %
MCH: 30.7 pg (ref 26.0–34.0)
MCHC: 33.9 g/dL (ref 32.0–36.0)
MCV: 91 fL (ref 80–100)
Monocyte #: 0.8 x10 3/mm (ref 0.2–0.9)
Neutrophil #: 2.4 x10 3/mm (ref 1.4–6.5)
Neutrophil %: 49.4 %
Platelet: 275 x10 3/mm (ref 150–440)
RDW: 21.3 % — ABNORMAL HIGH (ref 11.5–14.5)
WBC: 4.9 x10 3/mm (ref 3.6–11.0)

## 2012-12-14 LAB — COMPREHENSIVE METABOLIC PANEL
Albumin: 3.1 g/dL — ABNORMAL LOW (ref 3.4–5.0)
Alkaline Phosphatase: 67 U/L (ref 50–136)
BUN: 14 mg/dL (ref 7–18)
Chloride: 98 mmol/L (ref 98–107)
Creatinine: 0.91 mg/dL (ref 0.60–1.30)
EGFR (African American): >60
EGFR (Non-African Amer.): >60
Osmolality: 281 (ref 275–301)
Potassium: 3.6 mmol/L (ref 3.5–5.1)
SGPT (ALT): 23 U/L (ref 12–78)
Sodium: 140 mmol/L (ref 136–145)
Total Protein: 7.1 g/dL (ref 6.4–8.2)

## 2012-12-21 LAB — CBC CANCER CENTER
Basophil #: 0 x10 3/mm (ref 0.0–0.1)
Basophil %: 0.6 %
Eosinophil %: 1.1 %
HCT: 31.8 % — ABNORMAL LOW (ref 35.0–47.0)
HGB: 11 g/dL — ABNORMAL LOW (ref 12.0–16.0)
Lymphocyte #: 1.3 x10 3/mm (ref 1.0–3.6)
Lymphocyte %: 46.5 %
MCHC: 34.5 g/dL (ref 32.0–36.0)
MCV: 89 fL (ref 80–100)
Monocyte #: 0.2 x10 3/mm (ref 0.2–0.9)
Neutrophil #: 1.3 x10 3/mm — ABNORMAL LOW (ref 1.4–6.5)
Neutrophil %: 45 %

## 2012-12-21 LAB — COMPREHENSIVE METABOLIC PANEL
BUN: 25 mg/dL — ABNORMAL HIGH (ref 7–18)
Bilirubin,Total: 0.3 mg/dL (ref 0.2–1.0)
Calcium, Total: 8.3 mg/dL — ABNORMAL LOW (ref 8.5–10.1)
Chloride: 94 mmol/L — ABNORMAL LOW (ref 98–107)
Co2: 35 mmol/L — ABNORMAL HIGH (ref 21–32)
Creatinine: 1.29 mg/dL (ref 0.60–1.30)
EGFR (Non-African Amer.): 40 — ABNORMAL LOW
Glucose: 144 mg/dL — ABNORMAL HIGH (ref 65–99)
Osmolality: 283 (ref 275–301)
Potassium: 3.2 mmol/L — ABNORMAL LOW (ref 3.5–5.1)
SGOT(AST): 19 U/L (ref 15–37)
Sodium: 138 mmol/L (ref 136–145)
Total Protein: 7.4 g/dL (ref 6.4–8.2)

## 2012-12-28 LAB — CBC CANCER CENTER
Basophil #: 0 x10 3/mm (ref 0.0–0.1)
Eosinophil #: 0.1 x10 3/mm (ref 0.0–0.7)
HCT: 31.5 % — ABNORMAL LOW (ref 35.0–47.0)
Lymphocyte %: 29.6 %
MCH: 31.1 pg (ref 26.0–34.0)
MCHC: 33.8 g/dL (ref 32.0–36.0)
Monocyte #: 0.4 x10 3/mm (ref 0.2–0.9)
Monocyte %: 9 %
RBC: 3.42 10*6/uL — ABNORMAL LOW (ref 3.80–5.20)
RDW: 22.7 % — ABNORMAL HIGH (ref 11.5–14.5)

## 2013-01-04 LAB — CBC CANCER CENTER
Basophil #: 0 x10 3/mm (ref 0.0–0.1)
Eosinophil #: 0 x10 3/mm (ref 0.0–0.7)
Lymphocyte %: 42.5 %
MCHC: 33.8 g/dL (ref 32.0–36.0)
Monocyte #: 0.2 x10 3/mm (ref 0.2–0.9)
Monocyte %: 6.8 %
Neutrophil #: 1.4 x10 3/mm (ref 1.4–6.5)
Neutrophil %: 49.2 %
Platelet: 177 x10 3/mm (ref 150–440)
RBC: 3.24 10*6/uL — ABNORMAL LOW (ref 3.80–5.20)
RDW: 22.7 % — ABNORMAL HIGH (ref 11.5–14.5)

## 2013-01-09 ENCOUNTER — Ambulatory Visit: Payer: Self-pay | Admitting: Gynecologic Oncology

## 2013-01-11 LAB — CBC CANCER CENTER
Basophil %: 0.3 %
Eosinophil #: 0.1 x10 3/mm (ref 0.0–0.7)
Eosinophil %: 1.6 %
HCT: 29.7 % — ABNORMAL LOW (ref 35.0–47.0)
Lymphocyte #: 1 x10 3/mm (ref 1.0–3.6)
Lymphocyte %: 20.2 %
MCHC: 33.8 g/dL (ref 32.0–36.0)
Monocyte %: 8.6 %
Neutrophil %: 69.3 %
RBC: 3.12 10*6/uL — ABNORMAL LOW (ref 3.80–5.20)
WBC: 5.1 x10 3/mm (ref 3.6–11.0)

## 2013-01-11 LAB — BASIC METABOLIC PANEL
Anion Gap: 16 (ref 7–16)
Calcium, Total: 8.3 mg/dL — ABNORMAL LOW (ref 8.5–10.1)
Creatinine: 1.15 mg/dL (ref 0.60–1.30)
EGFR (African American): 54 — ABNORMAL LOW
EGFR (Non-African Amer.): 46 — ABNORMAL LOW
Glucose: 158 mg/dL — ABNORMAL HIGH (ref 65–99)
Osmolality: 292 (ref 275–301)
Potassium: 3.7 mmol/L (ref 3.5–5.1)
Sodium: 144 mmol/L (ref 136–145)

## 2013-01-11 LAB — MAGNESIUM: Magnesium: 1.4 mg/dL — ABNORMAL LOW

## 2013-01-18 LAB — CBC CANCER CENTER
Basophil #: 0 x10 3/mm (ref 0.0–0.1)
Basophil %: 1 %
Eosinophil #: 0 x10 3/mm (ref 0.0–0.7)
Eosinophil %: 0.8 %
HGB: 9.3 g/dL — ABNORMAL LOW (ref 12.0–16.0)
Lymphocyte #: 0.9 x10 3/mm — ABNORMAL LOW (ref 1.0–3.6)
Lymphocyte %: 52.1 %
Platelet: 104 x10 3/mm — ABNORMAL LOW (ref 150–440)
RBC: 2.89 10*6/uL — ABNORMAL LOW (ref 3.80–5.20)
WBC: 1.8 x10 3/mm — CL (ref 3.6–11.0)

## 2013-01-25 LAB — CBC CANCER CENTER
Basophil #: 0 x10 3/mm (ref 0.0–0.1)
Basophil %: 0.3 %
Eosinophil #: 0.1 x10 3/mm (ref 0.0–0.7)
HCT: 28 % — ABNORMAL LOW (ref 35.0–47.0)
HGB: 9.5 g/dL — ABNORMAL LOW (ref 12.0–16.0)
Lymphocyte %: 25.2 %
MCH: 33.5 pg (ref 26.0–34.0)
MCHC: 34 g/dL (ref 32.0–36.0)
Monocyte #: 0.4 x10 3/mm (ref 0.2–0.9)
Neutrophil %: 63.4 %
Platelet: 50 x10 3/mm — ABNORMAL LOW (ref 150–440)
RBC: 2.84 10*6/uL — ABNORMAL LOW (ref 3.80–5.20)
WBC: 4.1 x10 3/mm (ref 3.6–11.0)

## 2013-01-25 LAB — COMPREHENSIVE METABOLIC PANEL
Albumin: 3.3 g/dL — ABNORMAL LOW (ref 3.4–5.0)
Alkaline Phosphatase: 69 U/L (ref 50–136)
Anion Gap: 10 (ref 7–16)
BUN: 17 mg/dL (ref 7–18)
Calcium, Total: 7.9 mg/dL — ABNORMAL LOW (ref 8.5–10.1)
Co2: 32 mmol/L (ref 21–32)
Creatinine: 1.24 mg/dL (ref 0.60–1.30)
EGFR (African American): 49 — ABNORMAL LOW
Glucose: 129 mg/dL — ABNORMAL HIGH (ref 65–99)
Osmolality: 284 (ref 275–301)
Potassium: 3.3 mmol/L — ABNORMAL LOW (ref 3.5–5.1)
Sodium: 141 mmol/L (ref 136–145)
Total Protein: 7 g/dL (ref 6.4–8.2)

## 2013-02-01 LAB — CBC CANCER CENTER
Basophil #: 0 x10 3/mm (ref 0.0–0.1)
Basophil %: 0.5 %
Eosinophil %: 2.8 %
HCT: 29.6 % — ABNORMAL LOW (ref 35.0–47.0)
HGB: 10.1 g/dL — ABNORMAL LOW (ref 12.0–16.0)
MCH: 34.1 pg — ABNORMAL HIGH (ref 26.0–34.0)
MCHC: 34.2 g/dL (ref 32.0–36.0)
Monocyte #: 0.5 x10 3/mm (ref 0.2–0.9)
Neutrophil #: 1.4 x10 3/mm (ref 1.4–6.5)
Neutrophil %: 45.6 %

## 2013-02-01 LAB — COMPREHENSIVE METABOLIC PANEL
Albumin: 3.3 g/dL — ABNORMAL LOW (ref 3.4–5.0)
Alkaline Phosphatase: 73 U/L (ref 50–136)
Anion Gap: 8 (ref 7–16)
Bilirubin,Total: 0.3 mg/dL (ref 0.2–1.0)
Chloride: 99 mmol/L (ref 98–107)
Creatinine: 1.11 mg/dL (ref 0.60–1.30)
EGFR (African American): 56 — ABNORMAL LOW
EGFR (Non-African Amer.): 48 — ABNORMAL LOW
Glucose: 131 mg/dL — ABNORMAL HIGH (ref 65–99)
Potassium: 3.7 mmol/L (ref 3.5–5.1)
SGOT(AST): 15 U/L (ref 15–37)
SGPT (ALT): 20 U/L (ref 12–78)
Sodium: 139 mmol/L (ref 136–145)
Total Protein: 6.9 g/dL (ref 6.4–8.2)

## 2013-02-08 LAB — CBC CANCER CENTER
Basophil %: 0.5 %
Eosinophil #: 0 x10 3/mm (ref 0.0–0.7)
Eosinophil %: 1 %
HCT: 28.3 % — ABNORMAL LOW (ref 35.0–47.0)
HGB: 9.7 g/dL — ABNORMAL LOW (ref 12.0–16.0)
Lymphocyte %: 36.8 %
MCHC: 34.2 g/dL (ref 32.0–36.0)
MCV: 101 fL — ABNORMAL HIGH (ref 80–100)
Monocyte %: 8.2 %
Neutrophil %: 53.5 %
RDW: 19.2 % — ABNORMAL HIGH (ref 11.5–14.5)
WBC: 3.3 x10 3/mm — ABNORMAL LOW (ref 3.6–11.0)

## 2013-02-08 LAB — BASIC METABOLIC PANEL
Anion Gap: 12 (ref 7–16)
Calcium, Total: 8.5 mg/dL (ref 8.5–10.1)
Chloride: 97 mmol/L — ABNORMAL LOW (ref 98–107)
Co2: 33 mmol/L — ABNORMAL HIGH (ref 21–32)
EGFR (African American): 52 — ABNORMAL LOW
EGFR (Non-African Amer.): 45 — ABNORMAL LOW
Glucose: 148 mg/dL — ABNORMAL HIGH (ref 65–99)
Osmolality: 289 (ref 275–301)
Potassium: 4.1 mmol/L (ref 3.5–5.1)
Sodium: 142 mmol/L (ref 136–145)

## 2013-02-08 LAB — MAGNESIUM: Magnesium: 1.6 mg/dL — ABNORMAL LOW

## 2013-02-09 ENCOUNTER — Ambulatory Visit: Payer: Self-pay | Admitting: Gynecologic Oncology

## 2013-02-17 LAB — COMPREHENSIVE METABOLIC PANEL
Albumin: 3.3 g/dL — ABNORMAL LOW (ref 3.4–5.0)
Alkaline Phosphatase: 71 U/L (ref 50–136)
BUN: 23 mg/dL — ABNORMAL HIGH (ref 7–18)
Calcium, Total: 8.1 mg/dL — ABNORMAL LOW (ref 8.5–10.1)
Co2: 35 mmol/L — ABNORMAL HIGH (ref 21–32)
EGFR (African American): 36 — ABNORMAL LOW
Glucose: 142 mg/dL — ABNORMAL HIGH (ref 65–99)
Osmolality: 284 (ref 275–301)
Potassium: 3.5 mmol/L (ref 3.5–5.1)
SGOT(AST): 15 U/L (ref 15–37)
SGPT (ALT): 30 U/L (ref 12–78)
Total Protein: 7 g/dL (ref 6.4–8.2)

## 2013-02-17 LAB — CBC CANCER CENTER
Basophil #: 0 x10 3/mm (ref 0.0–0.1)
Eosinophil #: 0 x10 3/mm (ref 0.0–0.7)
HGB: 9.5 g/dL — ABNORMAL LOW (ref 12.0–16.0)
Lymphocyte #: 1.1 x10 3/mm (ref 1.0–3.6)
MCH: 34.1 pg — ABNORMAL HIGH (ref 26.0–34.0)
MCHC: 34.1 g/dL (ref 32.0–36.0)
MCV: 100 fL (ref 80–100)
Monocyte %: 2.5 %
Neutrophil #: 1.1 x10 3/mm — ABNORMAL LOW (ref 1.4–6.5)
Platelet: 18 x10 3/mm — CL (ref 150–440)
RBC: 2.79 10*6/uL — ABNORMAL LOW (ref 3.80–5.20)
RDW: 17.1 % — ABNORMAL HIGH (ref 11.5–14.5)
WBC: 2.2 x10 3/mm — ABNORMAL LOW (ref 3.6–11.0)

## 2013-02-22 LAB — COMPREHENSIVE METABOLIC PANEL
Alkaline Phosphatase: 70 U/L (ref 50–136)
Anion Gap: 9 (ref 7–16)
BUN: 16 mg/dL (ref 7–18)
EGFR (African American): 47 — ABNORMAL LOW
EGFR (Non-African Amer.): 40 — ABNORMAL LOW
Glucose: 97 mg/dL (ref 65–99)
Osmolality: 277 (ref 275–301)
SGPT (ALT): 25 U/L (ref 12–78)
Sodium: 138 mmol/L (ref 136–145)

## 2013-02-22 LAB — CBC CANCER CENTER
Basophil #: 0 x10 3/mm (ref 0.0–0.1)
Basophil %: 0.2 %
Eosinophil #: 0 x10 3/mm (ref 0.0–0.7)
Eosinophil %: 2 %
HCT: 25.4 % — ABNORMAL LOW (ref 35.0–47.0)
MCV: 101 fL — ABNORMAL HIGH (ref 80–100)
Neutrophil #: 0.6 x10 3/mm — ABNORMAL LOW (ref 1.4–6.5)
Neutrophil %: 34.7 %
Platelet: 36 x10 3/mm — ABNORMAL LOW (ref 150–440)
WBC: 1.6 x10 3/mm — CL (ref 3.6–11.0)

## 2013-03-01 LAB — COMPREHENSIVE METABOLIC PANEL
Albumin: 3.3 g/dL — ABNORMAL LOW (ref 3.4–5.0)
Alkaline Phosphatase: 67 U/L (ref 50–136)
BUN: 20 mg/dL — ABNORMAL HIGH (ref 7–18)
Bilirubin,Total: 0.2 mg/dL (ref 0.2–1.0)
Chloride: 101 mmol/L (ref 98–107)
Co2: 33 mmol/L — ABNORMAL HIGH (ref 21–32)
Creatinine: 1.15 mg/dL (ref 0.60–1.30)
EGFR (African American): 54 — ABNORMAL LOW
EGFR (Non-African Amer.): 46 — ABNORMAL LOW
Glucose: 112 mg/dL — ABNORMAL HIGH (ref 65–99)
SGOT(AST): 17 U/L (ref 15–37)
SGPT (ALT): 19 U/L (ref 12–78)
Total Protein: 6.9 g/dL (ref 6.4–8.2)

## 2013-03-01 LAB — CBC CANCER CENTER
Eosinophil %: 3.6 %
HCT: 28.1 % — ABNORMAL LOW (ref 35.0–47.0)
MCH: 34.5 pg — ABNORMAL HIGH (ref 26.0–34.0)
MCHC: 33.3 g/dL (ref 32.0–36.0)
MCV: 104 fL — ABNORMAL HIGH (ref 80–100)
Monocyte #: 0.7 x10 3/mm (ref 0.2–0.9)
Monocyte %: 27.1 %
Neutrophil %: 21.7 %
Platelet: 143 x10 3/mm — ABNORMAL LOW (ref 150–440)
RBC: 2.71 10*6/uL — ABNORMAL LOW (ref 3.80–5.20)

## 2013-03-11 ENCOUNTER — Ambulatory Visit: Payer: Self-pay | Admitting: Gynecologic Oncology

## 2013-03-29 ENCOUNTER — Ambulatory Visit: Payer: Self-pay | Admitting: Gynecologic Oncology

## 2013-03-31 ENCOUNTER — Ambulatory Visit: Payer: Self-pay | Admitting: Pain Medicine

## 2013-04-08 ENCOUNTER — Ambulatory Visit: Payer: Self-pay | Admitting: Pain Medicine

## 2013-04-11 ENCOUNTER — Ambulatory Visit: Payer: Self-pay | Admitting: Gynecologic Oncology

## 2013-04-12 LAB — COMPREHENSIVE METABOLIC PANEL
Albumin: 4 g/dL (ref 3.4–5.0)
Alkaline Phosphatase: 70 U/L (ref 50–136)
Anion Gap: 8 (ref 7–16)
Calcium, Total: 9.4 mg/dL (ref 8.5–10.1)
Chloride: 99 mmol/L (ref 98–107)
Co2: 34 mmol/L — ABNORMAL HIGH (ref 21–32)
EGFR (African American): 50 — ABNORMAL LOW
EGFR (Non-African Amer.): 43 — ABNORMAL LOW
Glucose: 108 mg/dL — ABNORMAL HIGH (ref 65–99)
Potassium: 4.9 mmol/L (ref 3.5–5.1)
SGOT(AST): 15 U/L (ref 15–37)
SGPT (ALT): 23 U/L (ref 12–78)
Sodium: 141 mmol/L (ref 136–145)
Total Protein: 7.9 g/dL (ref 6.4–8.2)

## 2013-04-12 LAB — CBC CANCER CENTER
Basophil #: 0.1 x10 3/mm (ref 0.0–0.1)
Basophil %: 0.4 %
Lymphocyte %: 13.3 %
MCH: 32.6 pg (ref 26.0–34.0)
MCHC: 32.8 g/dL (ref 32.0–36.0)
MCV: 99 fL (ref 80–100)
Monocyte #: 0.8 x10 3/mm (ref 0.2–0.9)
Neutrophil %: 77.1 %
WBC: 11.8 x10 3/mm — ABNORMAL HIGH (ref 3.6–11.0)

## 2013-05-11 ENCOUNTER — Ambulatory Visit: Payer: Self-pay | Admitting: Gynecologic Oncology

## 2013-06-08 ENCOUNTER — Ambulatory Visit: Payer: Self-pay | Admitting: General Practice

## 2013-06-08 LAB — BASIC METABOLIC PANEL
Anion Gap: 7 (ref 7–16)
Calcium, Total: 9.1 mg/dL (ref 8.5–10.1)
Co2: 32 mmol/L (ref 21–32)
Creatinine: 1.35 mg/dL — ABNORMAL HIGH (ref 0.60–1.30)
Glucose: 96 mg/dL (ref 65–99)
Potassium: 3.8 mmol/L (ref 3.5–5.1)

## 2013-06-08 LAB — URINALYSIS, COMPLETE
Bacteria: NONE SEEN
Bilirubin,UR: NEGATIVE
Blood: NEGATIVE
Glucose,UR: NEGATIVE mg/dL (ref 0–75)
Hyaline Cast: 8
Ketone: NEGATIVE
Nitrite: NEGATIVE
Ph: 5 (ref 4.5–8.0)
Protein: NEGATIVE
RBC,UR: 1 /HPF (ref 0–5)
Specific Gravity: 1.011 (ref 1.003–1.030)
Squamous Epithelial: 2

## 2013-06-08 LAB — CBC
HGB: 10.8 g/dL — ABNORMAL LOW (ref 12.0–16.0)
MCH: 30.5 pg (ref 26.0–34.0)
MCV: 92 fL (ref 80–100)
RBC: 3.54 10*6/uL — ABNORMAL LOW (ref 3.80–5.20)
RDW: 14.5 % (ref 11.5–14.5)
WBC: 8 10*3/uL (ref 3.6–11.0)

## 2013-06-08 LAB — MRSA PCR SCREENING

## 2013-06-09 ENCOUNTER — Ambulatory Visit: Payer: Self-pay | Admitting: Gynecologic Oncology

## 2013-06-10 LAB — COMPREHENSIVE METABOLIC PANEL
Anion Gap: 8 (ref 7–16)
Bilirubin,Total: 0.3 mg/dL (ref 0.2–1.0)
Chloride: 98 mmol/L (ref 98–107)
Creatinine: 1.23 mg/dL (ref 0.60–1.30)
EGFR (African American): 49 — ABNORMAL LOW
EGFR (Non-African Amer.): 43 — ABNORMAL LOW
Potassium: 4.1 mmol/L (ref 3.5–5.1)
SGOT(AST): 19 U/L (ref 15–37)
SGPT (ALT): 23 U/L (ref 12–78)
Sodium: 137 mmol/L (ref 136–145)

## 2013-06-10 LAB — CBC CANCER CENTER
Basophil #: 0 x10 3/mm (ref 0.0–0.1)
Basophil %: 0.5 %
Eosinophil #: 0.3 x10 3/mm (ref 0.0–0.7)
Eosinophil %: 4.5 %
HGB: 10.6 g/dL — ABNORMAL LOW (ref 12.0–16.0)
Lymphocyte %: 18.6 %
MCH: 31 pg (ref 26.0–34.0)
MCHC: 33.9 g/dL (ref 32.0–36.0)
MCV: 92 fL (ref 80–100)
Monocyte #: 0.4 x10 3/mm (ref 0.2–0.9)
Monocyte %: 7.4 %
Neutrophil #: 3.9 x10 3/mm (ref 1.4–6.5)
Platelet: 187 x10 3/mm (ref 150–440)
RBC: 3.43 10*6/uL — ABNORMAL LOW (ref 3.80–5.20)
RDW: 14.4 % (ref 11.5–14.5)
WBC: 5.6 x10 3/mm (ref 3.6–11.0)

## 2013-06-10 LAB — URINE CULTURE

## 2013-06-11 ENCOUNTER — Ambulatory Visit: Payer: Self-pay | Admitting: Gynecologic Oncology

## 2013-06-11 LAB — CA 125: CA 125: 11.2 U/mL (ref 0.0–34.0)

## 2013-06-21 ENCOUNTER — Inpatient Hospital Stay: Payer: Self-pay | Admitting: General Practice

## 2013-06-22 LAB — BASIC METABOLIC PANEL
BUN: 15 mg/dL (ref 7–18)
Calcium, Total: 8.1 mg/dL — ABNORMAL LOW (ref 8.5–10.1)
Chloride: 102 mmol/L (ref 98–107)
Co2: 30 mmol/L (ref 21–32)
Glucose: 153 mg/dL — ABNORMAL HIGH (ref 65–99)
Osmolality: 280 (ref 275–301)
Potassium: 4 mmol/L (ref 3.5–5.1)
Sodium: 138 mmol/L (ref 136–145)

## 2013-06-22 LAB — PLATELET COUNT: Platelet: 148 10*3/uL — ABNORMAL LOW (ref 150–440)

## 2013-06-23 LAB — BASIC METABOLIC PANEL
Anion Gap: 5 — ABNORMAL LOW (ref 7–16)
BUN: 13 mg/dL (ref 7–18)
Chloride: 99 mmol/L (ref 98–107)
Co2: 29 mmol/L (ref 21–32)
EGFR (African American): >60
EGFR (Non-African Amer.): >60
Osmolality: 269 (ref 275–301)
Sodium: 133 mmol/L — ABNORMAL LOW (ref 136–145)

## 2013-07-03 ENCOUNTER — Emergency Department: Payer: Self-pay | Admitting: Emergency Medicine

## 2013-07-05 ENCOUNTER — Encounter: Payer: Self-pay | Admitting: Internal Medicine

## 2013-07-06 LAB — COMPREHENSIVE METABOLIC PANEL WITH GFR
Albumin: 3 g/dL — ABNORMAL LOW
Alkaline Phosphatase: 67 U/L
Anion Gap: 7
BUN: 23 mg/dL — ABNORMAL HIGH
Bilirubin,Total: 0.4 mg/dL
Calcium, Total: 9 mg/dL
Chloride: 94 mmol/L — ABNORMAL LOW
Co2: 32 mmol/L
Creatinine: 1.04 mg/dL
EGFR (African American): >60
EGFR (Non-African Amer.): 52 — ABNORMAL LOW
Glucose: 102 mg/dL — ABNORMAL HIGH
Osmolality: 270
Potassium: 3.5 mmol/L
SGOT(AST): 26 U/L
SGPT (ALT): 24 U/L
Sodium: 133 mmol/L — ABNORMAL LOW
Total Protein: 6.7 g/dL

## 2013-07-06 LAB — CBC WITH DIFFERENTIAL/PLATELET
Basophil #: 0 x10 3/mm 3
Basophil %: 0.4 %
Eosinophil #: 0.3 x10 3/mm 3
Eosinophil %: 5 %
HCT: 26.7 % — ABNORMAL LOW
HGB: 8.9 g/dL — ABNORMAL LOW
Lymphocyte %: 18.4 %
Lymphs Abs: 1.1 x10 3/mm 3
MCH: 29.7 pg
MCHC: 33.4 g/dL
MCV: 89 fL
Monocyte #: 0.5 "x10 3/mm "
Monocyte %: 7.8 %
Neutrophil #: 4.1 x10 3/mm 3
Neutrophil %: 68.4 %
Platelet: 182 x10 3/mm 3
RBC: 3 X10 6/mm 3 — ABNORMAL LOW
RDW: 14.9 % — ABNORMAL HIGH
WBC: 6 x10 3/mm 3

## 2013-07-12 ENCOUNTER — Encounter: Payer: Self-pay | Admitting: Internal Medicine

## 2013-08-02 ENCOUNTER — Ambulatory Visit: Payer: Self-pay | Admitting: Oncology

## 2013-08-10 ENCOUNTER — Ambulatory Visit: Payer: Self-pay | Admitting: Pain Medicine

## 2013-08-11 ENCOUNTER — Ambulatory Visit: Payer: Self-pay | Admitting: Oncology

## 2013-08-23 ENCOUNTER — Ambulatory Visit: Payer: Self-pay | Admitting: Pain Medicine

## 2013-08-30 ENCOUNTER — Ambulatory Visit: Payer: Self-pay | Admitting: Pain Medicine

## 2013-08-31 ENCOUNTER — Ambulatory Visit: Payer: Self-pay | Admitting: Pain Medicine

## 2013-09-13 ENCOUNTER — Ambulatory Visit: Payer: Self-pay | Admitting: Oncology

## 2013-09-15 ENCOUNTER — Ambulatory Visit: Payer: Self-pay | Admitting: Pain Medicine

## 2013-10-11 ENCOUNTER — Ambulatory Visit: Payer: Self-pay | Admitting: Oncology

## 2013-10-25 ENCOUNTER — Ambulatory Visit: Payer: Self-pay | Admitting: Oncology

## 2013-10-26 ENCOUNTER — Ambulatory Visit: Payer: Self-pay | Admitting: Pain Medicine

## 2013-11-01 ENCOUNTER — Inpatient Hospital Stay: Payer: Self-pay | Admitting: Surgery

## 2013-11-01 LAB — URINALYSIS, COMPLETE
Nitrite: NEGATIVE
RBC,UR: <1 /HPF (ref 0–5)
Specific Gravity: 1.008 (ref 1.003–1.030)
Squamous Epithelial: 1

## 2013-11-01 LAB — COMPREHENSIVE METABOLIC PANEL
Albumin: 3.7 g/dL (ref 3.4–5.0)
Alkaline Phosphatase: 63 U/L
Anion Gap: 8 (ref 7–16)
BUN: 37 mg/dL — ABNORMAL HIGH (ref 7–18)
Chloride: 100 mmol/L (ref 98–107)
Co2: 27 mmol/L (ref 21–32)
EGFR (Non-African Amer.): 49 — ABNORMAL LOW
Osmolality: 281 (ref 275–301)
Potassium: 4.5 mmol/L (ref 3.5–5.1)
SGPT (ALT): 26 U/L (ref 12–78)
Sodium: 135 mmol/L — ABNORMAL LOW (ref 136–145)

## 2013-11-01 LAB — CBC
HCT: 41.2 % (ref 35.0–47.0)
HGB: 12.9 g/dL (ref 12.0–16.0)
MCH: 27.5 pg (ref 26.0–34.0)
MCHC: 31.3 g/dL — ABNORMAL LOW (ref 32.0–36.0)
MCV: 88 fL (ref 80–100)
Platelet: 189 10*3/uL (ref 150–440)
RBC: 4.7 10*6/uL (ref 3.80–5.20)
RDW: 16.6 % — ABNORMAL HIGH (ref 11.5–14.5)
WBC: 18.9 10*3/uL — ABNORMAL HIGH (ref 3.6–11.0)

## 2013-11-01 LAB — LIPASE, BLOOD: Lipase: 115 U/L (ref 73–393)

## 2013-11-02 LAB — CBC WITH DIFFERENTIAL/PLATELET
Basophil #: 0 10*3/uL (ref 0.0–0.1)
Eosinophil #: 0.1 10*3/uL (ref 0.0–0.7)
Eosinophil %: 1.1 %
HCT: 34.1 % — ABNORMAL LOW (ref 35.0–47.0)
HGB: 11.3 g/dL — ABNORMAL LOW (ref 12.0–16.0)
Lymphocyte %: 14.1 %
MCV: 89 fL (ref 80–100)
Monocyte #: 0.6 x10 3/mm (ref 0.2–0.9)
Neutrophil #: 7.1 10*3/uL — ABNORMAL HIGH (ref 1.4–6.5)
WBC: 9.1 10*3/uL (ref 3.6–11.0)

## 2013-11-02 LAB — BASIC METABOLIC PANEL
Anion Gap: 3 — ABNORMAL LOW (ref 7–16)
Chloride: 105 mmol/L (ref 98–107)
EGFR (Non-African Amer.): >60
Glucose: 115 mg/dL — ABNORMAL HIGH (ref 65–99)
Potassium: 5 mmol/L (ref 3.5–5.1)
Sodium: 138 mmol/L (ref 136–145)

## 2013-11-10 ENCOUNTER — Ambulatory Visit: Payer: Self-pay | Admitting: Surgery

## 2013-11-11 ENCOUNTER — Ambulatory Visit: Payer: Self-pay | Admitting: Oncology

## 2013-12-09 ENCOUNTER — Ambulatory Visit: Payer: Self-pay | Admitting: Oncology

## 2013-12-09 LAB — COMPREHENSIVE METABOLIC PANEL
AST: 20 U/L (ref 15–37)
Albumin: 3.5 g/dL (ref 3.4–5.0)
Alkaline Phosphatase: 65 U/L
Anion Gap: 10 (ref 7–16)
BUN: 20 mg/dL — ABNORMAL HIGH (ref 7–18)
Bilirubin,Total: 0.4 mg/dL (ref 0.2–1.0)
CALCIUM: 8.9 mg/dL (ref 8.5–10.1)
CREATININE: 1.08 mg/dL (ref 0.60–1.30)
Chloride: 96 mmol/L — ABNORMAL LOW (ref 98–107)
Co2: 32 mmol/L (ref 21–32)
EGFR (African American): 57 — ABNORMAL LOW
GFR CALC NON AF AMER: 49 — AB
GLUCOSE: 114 mg/dL — AB (ref 65–99)
OSMOLALITY: 279 (ref 275–301)
POTASSIUM: 3 mmol/L — AB (ref 3.5–5.1)
SGPT (ALT): 23 U/L (ref 12–78)
Sodium: 138 mmol/L (ref 136–145)
TOTAL PROTEIN: 7.2 g/dL (ref 6.4–8.2)

## 2013-12-09 LAB — CBC CANCER CENTER
Basophil #: 0 x10 3/mm (ref 0.0–0.1)
Basophil %: 0.3 %
Eosinophil #: 0.2 x10 3/mm (ref 0.0–0.7)
Eosinophil %: 3.3 %
HCT: 36 % (ref 35.0–47.0)
HGB: 11.8 g/dL — AB (ref 12.0–16.0)
LYMPHS ABS: 1.7 x10 3/mm (ref 1.0–3.6)
Lymphocyte %: 23.8 %
MCH: 29.5 pg (ref 26.0–34.0)
MCHC: 32.8 g/dL (ref 32.0–36.0)
MCV: 90 fL (ref 80–100)
MONO ABS: 0.5 x10 3/mm (ref 0.2–0.9)
Monocyte %: 6.6 %
NEUTROS PCT: 66 %
Neutrophil #: 4.7 x10 3/mm (ref 1.4–6.5)
Platelet: 194 x10 3/mm (ref 150–440)
RBC: 4.01 10*6/uL (ref 3.80–5.20)
RDW: 15.1 % — ABNORMAL HIGH (ref 11.5–14.5)
WBC: 7.1 x10 3/mm (ref 3.6–11.0)

## 2013-12-10 LAB — CEA: CEA: 1.8 ng/mL (ref 0.0–4.7)

## 2013-12-12 ENCOUNTER — Ambulatory Visit: Payer: Self-pay | Admitting: Oncology

## 2013-12-30 ENCOUNTER — Ambulatory Visit: Payer: Self-pay | Admitting: Pain Medicine

## 2014-01-17 ENCOUNTER — Ambulatory Visit: Payer: Self-pay | Admitting: Pain Medicine

## 2014-01-20 ENCOUNTER — Ambulatory Visit: Payer: Self-pay | Admitting: Oncology

## 2014-01-25 LAB — CBC CANCER CENTER
BASOS PCT: 0.5 %
Basophil #: 0 x10 3/mm (ref 0.0–0.1)
EOS ABS: 0.4 x10 3/mm (ref 0.0–0.7)
Eosinophil %: 4.7 %
HCT: 38.4 % (ref 35.0–47.0)
HGB: 12.5 g/dL (ref 12.0–16.0)
LYMPHS ABS: 2 x10 3/mm (ref 1.0–3.6)
LYMPHS PCT: 21.9 %
MCH: 29.6 pg (ref 26.0–34.0)
MCHC: 32.7 g/dL (ref 32.0–36.0)
MCV: 91 fL (ref 80–100)
Monocyte #: 0.6 x10 3/mm (ref 0.2–0.9)
Monocyte %: 6.6 %
NEUTROS ABS: 6 x10 3/mm (ref 1.4–6.5)
Neutrophil %: 66.3 %
Platelet: 212 x10 3/mm (ref 150–440)
RBC: 4.24 10*6/uL (ref 3.80–5.20)
RDW: 14.4 % (ref 11.5–14.5)
WBC: 9 x10 3/mm (ref 3.6–11.0)

## 2014-01-27 LAB — CA 125: CA 125: 15.3 U/mL (ref 0.0–34.0)

## 2014-02-08 ENCOUNTER — Ambulatory Visit: Payer: Self-pay | Admitting: Internal Medicine

## 2014-02-09 ENCOUNTER — Ambulatory Visit: Payer: Self-pay | Admitting: Oncology

## 2014-03-08 LAB — COMPREHENSIVE METABOLIC PANEL
ALBUMIN: 3.6 g/dL (ref 3.4–5.0)
ALK PHOS: 65 U/L
AST: 17 U/L (ref 15–37)
Anion Gap: 7 (ref 7–16)
BUN: 27 mg/dL — AB (ref 7–18)
Bilirubin,Total: 0.4 mg/dL (ref 0.2–1.0)
CHLORIDE: 100 mmol/L (ref 98–107)
CREATININE: 1.29 mg/dL (ref 0.60–1.30)
Calcium, Total: 9 mg/dL (ref 8.5–10.1)
Co2: 34 mmol/L — ABNORMAL HIGH (ref 21–32)
EGFR (African American): 46 — ABNORMAL LOW
EGFR (Non-African Amer.): 40 — ABNORMAL LOW
GLUCOSE: 103 mg/dL — AB (ref 65–99)
Osmolality: 287 (ref 275–301)
Potassium: 3.8 mmol/L (ref 3.5–5.1)
SGPT (ALT): 21 U/L (ref 12–78)
SODIUM: 141 mmol/L (ref 136–145)
Total Protein: 7.3 g/dL (ref 6.4–8.2)

## 2014-03-08 LAB — CBC CANCER CENTER
Basophil #: 0 x10 3/mm (ref 0.0–0.1)
Basophil %: 0.4 %
Eosinophil #: 0.8 x10 3/mm — ABNORMAL HIGH (ref 0.0–0.7)
Eosinophil %: 10 %
HCT: 35.8 % (ref 35.0–47.0)
HGB: 11.7 g/dL — ABNORMAL LOW (ref 12.0–16.0)
Lymphocyte #: 2 x10 3/mm (ref 1.0–3.6)
Lymphocyte %: 25.1 %
MCH: 29.4 pg (ref 26.0–34.0)
MCHC: 32.7 g/dL (ref 32.0–36.0)
MCV: 90 fL (ref 80–100)
MONO ABS: 0.6 x10 3/mm (ref 0.2–0.9)
MONOS PCT: 7.9 %
Neutrophil #: 4.4 x10 3/mm (ref 1.4–6.5)
Neutrophil %: 56.6 %
PLATELETS: 197 x10 3/mm (ref 150–440)
RBC: 3.98 10*6/uL (ref 3.80–5.20)
RDW: 14 % (ref 11.5–14.5)
WBC: 7.8 x10 3/mm (ref 3.6–11.0)

## 2014-03-09 LAB — CA 125: CA 125: 14.9 U/mL (ref 0.0–34.0)

## 2014-03-11 ENCOUNTER — Ambulatory Visit: Payer: Self-pay | Admitting: Oncology

## 2014-03-18 DIAGNOSIS — I272 Pulmonary hypertension, unspecified: Secondary | ICD-10-CM | POA: Insufficient documentation

## 2014-03-18 DIAGNOSIS — G4733 Obstructive sleep apnea (adult) (pediatric): Secondary | ICD-10-CM | POA: Insufficient documentation

## 2014-03-21 ENCOUNTER — Ambulatory Visit: Payer: Self-pay | Admitting: Oncology

## 2014-03-21 ENCOUNTER — Ambulatory Visit: Payer: Self-pay | Admitting: Pain Medicine

## 2014-03-25 ENCOUNTER — Other Ambulatory Visit: Payer: Self-pay | Admitting: Neurosurgery

## 2014-03-25 DIAGNOSIS — M549 Dorsalgia, unspecified: Secondary | ICD-10-CM

## 2014-04-05 ENCOUNTER — Ambulatory Visit
Admission: RE | Admit: 2014-04-05 | Discharge: 2014-04-05 | Disposition: A | Discharge status: Home / Self Care | Payer: Medicare Other | Source: Ambulatory Visit | Attending: Neurosurgery | Admitting: Neurosurgery

## 2014-04-05 VITALS — BP 107/44 | HR 74

## 2014-04-05 DIAGNOSIS — M549 Dorsalgia, unspecified: Secondary | ICD-10-CM

## 2014-04-05 MED ORDER — DIAZEPAM 5 MG PO TABS
5.0000 mg | ORAL_TABLET | Freq: Once | ORAL | Status: AC
  Administered 2014-04-05: 5 mg via ORAL

## 2014-04-05 MED ORDER — MEPERIDINE HCL 100 MG/ML IJ SOLN
75.0000 mg | Freq: Once | INTRAMUSCULAR | Status: AC
  Administered 2014-04-05: 75 mg via INTRAMUSCULAR

## 2014-04-05 MED ORDER — ONDANSETRON HCL 4 MG/2ML IJ SOLN
4.0000 mg | Freq: Once | INTRAMUSCULAR | Status: AC
  Administered 2014-04-05: 4 mg via INTRAMUSCULAR

## 2014-04-05 MED ORDER — IOHEXOL 180 MG/ML  SOLN
15.0000 mL | Freq: Once | INTRAMUSCULAR | Status: AC | PRN
  Administered 2014-04-05: 15 mL via INTRATHECAL

## 2014-04-05 NOTE — Discharge Instructions (Signed)
Myelogram Discharge Instructions  1. Go home and rest quietly for the next 24 hours.  It is important to lie flat for the next 24 hours.  Get up only to go to the restroom.  You may lie in the bed or on a couch on your back, your stomach, your left side or your right side.  You may have one pillow under your head.  You may have pillows between your knees while you are on your side or under your knees while you are on your back.  2. DO NOT drive today.  Recline the seat as far back as it will go, while still wearing your seat belt, on the way home.  3. You may get up to go to the bathroom as needed.  You may sit up for 10 minutes to eat.  You may resume your normal diet and medications unless otherwise indicated.  Drink plenty of extra fluids today and tomorrow.  4. The incidence of a spinal headache with nausea and/or vomiting is about 5% (one in 20 patients).  If you develop a headache, lie flat and drink plenty of fluids until the headache goes away.  Caffeinated beverages may be helpful.  If you develop severe nausea and vomiting or a headache that does not go away with flat bed rest, call [phone].  5. You may resume normal activities after your 24 hours of bed rest is over; however, do not exert yourself strongly or do any heavy lifting tomorrow.  6. Call your physician for a follow-up appointment.   You may resume Citalopram on Wednesday, Apr 06, 2014 after 9:30a.m.

## 2014-04-11 ENCOUNTER — Ambulatory Visit: Payer: Self-pay | Admitting: Oncology

## 2014-04-26 ENCOUNTER — Other Ambulatory Visit: Payer: Self-pay | Admitting: Neurosurgery

## 2014-04-26 ENCOUNTER — Ambulatory Visit: Payer: Self-pay | Admitting: Oncology

## 2014-05-02 ENCOUNTER — Encounter (HOSPITAL_COMMUNITY): Payer: Self-pay | Admitting: Pharmacy Technician

## 2014-05-03 ENCOUNTER — Encounter (HOSPITAL_COMMUNITY)
Admission: RE | Admit: 2014-05-03 | Discharge: 2014-05-03 | Disposition: A | Discharge status: Home / Self Care | Payer: Medicare Other | Source: Ambulatory Visit | Attending: Neurosurgery | Admitting: Neurosurgery

## 2014-05-03 ENCOUNTER — Encounter (HOSPITAL_COMMUNITY)
Admission: RE | Admit: 2014-05-03 | Discharge: 2014-05-03 | Disposition: A | Discharge status: Home / Self Care | Payer: Medicare Other | Source: Ambulatory Visit | Attending: Anesthesiology | Admitting: Anesthesiology

## 2014-05-03 ENCOUNTER — Encounter (HOSPITAL_COMMUNITY): Payer: Self-pay

## 2014-05-03 HISTORY — DX: Major depressive disorder, single episode, unspecified: F32.9

## 2014-05-03 HISTORY — DX: Shortness of breath: R06.02

## 2014-05-03 HISTORY — DX: Myoneural disorder, unspecified: G70.9

## 2014-05-03 HISTORY — DX: Unspecified osteoarthritis, unspecified site: M19.90

## 2014-05-03 HISTORY — DX: Depression, unspecified: F32.A

## 2014-05-03 HISTORY — DX: Other somatoform disorders: F45.8

## 2014-05-03 HISTORY — DX: Sleep apnea, unspecified: G47.30

## 2014-05-03 HISTORY — DX: Pulmonary hypertension, unspecified: I27.20

## 2014-05-03 LAB — CBC
HCT: 35.2 % — ABNORMAL LOW (ref 36.0–46.0)
HEMOGLOBIN: 11.3 g/dL — AB (ref 12.0–15.0)
MCH: 29.4 pg (ref 26.0–34.0)
MCHC: 32.1 g/dL (ref 30.0–36.0)
MCV: 91.7 fL (ref 78.0–100.0)
PLATELETS: 121 10*3/uL — AB (ref 150–400)
RBC: 3.84 MIL/uL — AB (ref 3.87–5.11)
RDW: 14 % (ref 11.5–15.5)
WBC: 8 10*3/uL (ref 4.0–10.5)

## 2014-05-03 LAB — BASIC METABOLIC PANEL
BUN: 26 mg/dL — ABNORMAL HIGH (ref 6–23)
CALCIUM: 9.3 mg/dL (ref 8.4–10.5)
CO2: 31 meq/L (ref 19–32)
Chloride: 100 mEq/L (ref 96–112)
Creatinine, Ser: 1.15 mg/dL — ABNORMAL HIGH (ref 0.50–1.10)
GFR calc Af Amer: 52 mL/min — ABNORMAL LOW (ref >90)
GFR calc non Af Amer: 45 mL/min — ABNORMAL LOW (ref >90)
GLUCOSE: 107 mg/dL — AB (ref 70–99)
Potassium: 4.4 mEq/L (ref 3.7–5.3)
Sodium: 142 mEq/L (ref 137–147)

## 2014-05-03 LAB — TYPE AND SCREEN
ABO/RH(D): B POS
ANTIBODY SCREEN: NEGATIVE

## 2014-05-03 LAB — SURGICAL PCR SCREEN
MRSA, PCR: NEGATIVE
STAPHYLOCOCCUS AUREUS: NEGATIVE

## 2014-05-03 LAB — ABO/RH: ABO/RH(D): B POS

## 2014-05-03 NOTE — Progress Notes (Signed)
Call to [facility] [phone], requested sleep study results be faxed to [phone]

## 2014-05-03 NOTE — Progress Notes (Signed)
Call to [facility] clinic, spoke with Lelan Pons in cardiology, requesting name of sleep center .  Call to Dr. Gust Brooms office, , spoke with Sharyn Lull & requested PFT's & last OV note.

## 2014-05-03 NOTE — Pre-Procedure Instructions (Signed)
JAMECA CHUMLEY  05/03/2014   Your procedure is scheduled on:  Thursday, June 25.  Report to [facility] Admitting at 5:30AM.  Call this number if you have problems the morning of surgery: [phone]   Remember:   Do not eat food or drink liquids after midnight Wednesday.   Take these medicines the morning of surgery with A SIP OF WATER: citalopram (CELEXA), gabapentin (NEURONTIN).  Use inhalers if needed, bring  Albuterol inhaler to the hospital with you.              Take if needed:HYDROcodone-acetaminophen (NORCO/VICODIN).                Stop taking all vitamins and herbal medications.     Do not wear jewelry, make-up or nail polish.  Do not wear lotions, powders, or perfumes.   Do not shave 48 hours prior to surgery.   Do not bring valuables to the hospital.              [facility] is not responsible for any belongings or valuables.               Contacts, dentures or bridgework may not be worn into surgery.  Leave suitcase in the car. After surgery it may be brought to your room.  For patients admitted to the hospital, discharge time is determined by your  treatment team.           Special Instructions: Bring Mask for CPAP.     Please read over the following fact sheets that you were given: Pain Booklet, Coughing and Deep Breathing, Blood Transfusion Information and Surgical Site Infection Prevention

## 2014-05-03 NOTE — Progress Notes (Signed)
Call to A. Zelenak, PA-C for pt. Review due to pt. Consults with pulmonary & cardiology

## 2014-05-04 MED ORDER — CEFAZOLIN SODIUM-DEXTROSE 2-3 GM-% IV SOLR
2.0000 g | INTRAVENOUS | Status: AC
  Administered 2014-05-05: 2 g via INTRAVENOUS
  Filled 2014-05-04: qty 50

## 2014-05-04 MED ORDER — DEXAMETHASONE SODIUM PHOSPHATE 10 MG/ML IJ SOLN
10.0000 mg | INTRAMUSCULAR | Status: AC
  Administered 2014-05-05: 10 mg via INTRAVENOUS
  Filled 2014-05-04: qty 1

## 2014-05-04 NOTE — Progress Notes (Signed)
Anesthesia PAT Evaluation:  Patient is a 80 year old female scheduled for L3-4, L4-5 PLIF on 05/05/14 by Dr. Hal Neer.  History includes non-smoker, HTN, pulmonary HTN, OSA with CPAP use, depression, ovarian cancer with recurrence in 2013, Port-a-cath, neuropahty, right THA, bilateral TKA, hysterectomy. BMI is 39.97 consistent with obesity/borderline morbid obesity.  PCP is Dr. Emily Filbert.  Pain MD is Dr. Dossie Arbour who recently referred her to pulmonology and cardiology due to patient reporting increased SOB and LE edema. Pulmonologist is Dr. Wallene Huh who cleared patient from his standpoint.  Cardiologist is Dr. Nehemiah Massed who cleared patient from his standpoint as well.  Oncologist is Dr. Oliva Bustard.    EKG on 01/31/14 [facility]) showed: Sinus rhythm, septal infarct, age undetermined, low QRS voltage in precordial leads, minor high lateral repolarization disturbance, consider ischemia (I think these changes appear non-specific).  Echocardiogram on 02/09/14 [facility] Gourd) showed normal LV systolic function. EF 60%. Moderate to severe mitral insufficiency (one place in echo report mentions only moderate MR, another moderate to severe MR). Mild tricuspid insufficiency. Moderate to severe pulmonary hypertension. RVSP 57 mmHg.   Nuclear stress test on 02/09/14 [facility]) showed: Normal Regadenoson ECG without evidence of ischemia or dysrhythmia. Normal left ventricular function with EF 68%. Normal myocardial perfusion images the rest and peak stress without evidence of ischemia.  Spirometry on 03/18/14 ([facility]) showed: FVC 12.5 (pre 52%, post 69%), FEV1 0.82 (pre 45%, post 56%), FEF 25-75% 0.49 (pre 30%, post 28%).  CXR on 05/03/14 showed: Left chest wall Port-a-cath. Stable enlarged cardiac and mediastinal contours. No consolidative pulmonary opacities, pleural effusion, or pneumothorax. No acute cardiopulmonary process.  Preoperative labs noted.   Patient denies prior complications with anesthesia.   She denies known CAD/MI history, no history of DVT, no DM. Her breathing has overall improved since she has had her cardiac and pulmonary evaluations and is now on Advair, CPAP, and diuretic therapy. Her heart is RRR on exam, lung clear.  No carotid bruits noted.  She does have large BLE, 1+ edema.  Her ASA is on hold for surgery.  If no acute changes then I would anticipate that she could proceed as planned.  [facility] Stay [facility]/Anesthesiology Phone [phone] 05/04/2014 10:02 AM

## 2014-05-05 ENCOUNTER — Encounter (HOSPITAL_COMMUNITY)
Admission: RE | Disposition: A | Discharge status: Home Health | Payer: Self-pay | Source: Ambulatory Visit | Attending: Neurosurgery

## 2014-05-05 ENCOUNTER — Encounter (HOSPITAL_COMMUNITY): Payer: Medicare Other | Admitting: Vascular Surgery

## 2014-05-05 ENCOUNTER — Inpatient Hospital Stay (HOSPITAL_COMMUNITY): Payer: Medicare Other | Admitting: Certified Registered"

## 2014-05-05 ENCOUNTER — Encounter (HOSPITAL_COMMUNITY): Payer: Self-pay | Admitting: *Deleted

## 2014-05-05 ENCOUNTER — Inpatient Hospital Stay (HOSPITAL_COMMUNITY): Payer: Medicare Other

## 2014-05-05 ENCOUNTER — Inpatient Hospital Stay (HOSPITAL_COMMUNITY)
Admission: RE | Admit: 2014-05-05 | Discharge: 2014-05-07 | DRG: 460 | Disposition: A | Discharge status: Home Health | Payer: Medicare Other | Source: Ambulatory Visit | Attending: Neurosurgery | Admitting: Neurosurgery

## 2014-05-05 DIAGNOSIS — M4316 Spondylolisthesis, lumbar region: Secondary | ICD-10-CM | POA: Diagnosis present

## 2014-05-05 DIAGNOSIS — I10 Essential (primary) hypertension: Secondary | ICD-10-CM | POA: Diagnosis present

## 2014-05-05 DIAGNOSIS — Z7982 Long term (current) use of aspirin: Secondary | ICD-10-CM

## 2014-05-05 DIAGNOSIS — Q762 Congenital spondylolisthesis: Secondary | ICD-10-CM

## 2014-05-05 DIAGNOSIS — M48061 Spinal stenosis, lumbar region without neurogenic claudication: Principal | ICD-10-CM | POA: Diagnosis present

## 2014-05-05 DIAGNOSIS — I2789 Other specified pulmonary heart diseases: Secondary | ICD-10-CM | POA: Diagnosis present

## 2014-05-05 DIAGNOSIS — Z888 Allergy status to other drugs, medicaments and biological substances status: Secondary | ICD-10-CM

## 2014-05-05 DIAGNOSIS — Z96649 Presence of unspecified artificial hip joint: Secondary | ICD-10-CM

## 2014-05-05 DIAGNOSIS — G4730 Sleep apnea, unspecified: Secondary | ICD-10-CM | POA: Diagnosis present

## 2014-05-05 SURGERY — POSTERIOR LUMBAR FUSION 2 LEVEL
Anesthesia: General | Site: Back

## 2014-05-05 MED ORDER — ARTIFICIAL TEARS OP OINT
TOPICAL_OINTMENT | OPHTHALMIC | Status: DC | PRN
  Administered 2014-05-05: 1 via OPHTHALMIC

## 2014-05-05 MED ORDER — VECURONIUM BROMIDE 10 MG IV SOLR
INTRAVENOUS | Status: AC
  Filled 2014-05-05: qty 10

## 2014-05-05 MED ORDER — PHENOL 1.4 % MT LIQD: 1.0000 | OROMUCOSAL | Status: DC | PRN

## 2014-05-05 MED ORDER — MOMETASONE FURO-FORMOTEROL FUM 100-5 MCG/ACT IN AERO
2.0000 | INHALATION_SPRAY | Freq: Two times a day (BID) | RESPIRATORY_TRACT | Status: DC
  Administered 2014-05-06 – 2014-05-07 (×3): 2 via RESPIRATORY_TRACT
  Filled 2014-05-05: qty 8.8

## 2014-05-05 MED ORDER — HYDROMORPHONE HCL PF 1 MG/ML IJ SOLN
0.2500 mg | INTRAMUSCULAR | Status: DC | PRN
  Administered 2014-05-05 (×4): 0.5 mg via INTRAVENOUS

## 2014-05-05 MED ORDER — BUPIVACAINE HCL (PF) 0.5 % IJ SOLN
INTRAMUSCULAR | Status: DC | PRN
  Administered 2014-05-05: 20 mL

## 2014-05-05 MED ORDER — ONDANSETRON HCL 4 MG/2ML IJ SOLN: 4.0000 mg | INTRAMUSCULAR | Status: DC | PRN

## 2014-05-05 MED ORDER — PROMETHAZINE HCL 25 MG/ML IJ SOLN: 6.2500 mg | INTRAMUSCULAR | Status: DC | PRN

## 2014-05-05 MED ORDER — ONDANSETRON HCL 4 MG/2ML IJ SOLN
INTRAMUSCULAR | Status: DC | PRN
Start: 2014-05-05 — End: 2014-05-05
  Administered 2014-05-05: 4 mg via INTRAVENOUS

## 2014-05-05 MED ORDER — ROCURONIUM BROMIDE 50 MG/5ML IV SOLN
INTRAVENOUS | Status: AC
  Filled 2014-05-05: qty 1

## 2014-05-05 MED ORDER — LIDOCAINE HCL (CARDIAC) 20 MG/ML IV SOLN
INTRAVENOUS | Status: AC
  Filled 2014-05-05: qty 5

## 2014-05-05 MED ORDER — PROPOFOL 10 MG/ML IV BOLUS
INTRAVENOUS | Status: DC | PRN
  Administered 2014-05-05: 120 mg via INTRAVENOUS

## 2014-05-05 MED ORDER — MENTHOL 3 MG MT LOZG: 1.0000 | LOZENGE | OROMUCOSAL | Status: DC | PRN

## 2014-05-05 MED ORDER — KCL IN DEXTROSE-NACL 20-5-0.45 MEQ/L-%-% IV SOLN
80.0000 mL/h | INTRAVENOUS | Status: DC
  Administered 2014-05-05: 80 mL/h via INTRAVENOUS
  Filled 2014-05-05: qty 1000

## 2014-05-05 MED ORDER — TORSEMIDE 20 MG PO TABS
20.0000 mg | ORAL_TABLET | Freq: Every day | ORAL | Status: DC
  Administered 2014-05-06 – 2014-05-07 (×2): 20 mg via ORAL
  Filled 2014-05-05 (×2): qty 1

## 2014-05-05 MED ORDER — OXYCODONE HCL 5 MG/5ML PO SOLN: 5.0000 mg | Freq: Once | ORAL | Status: AC | PRN

## 2014-05-05 MED ORDER — ALBUTEROL SULFATE HFA 108 (90 BASE) MCG/ACT IN AERS: 2.0000 | INHALATION_SPRAY | Freq: Four times a day (QID) | RESPIRATORY_TRACT | Status: DC | PRN

## 2014-05-05 MED ORDER — PROPOFOL 10 MG/ML IV BOLUS
INTRAVENOUS | Status: AC
  Filled 2014-05-05: qty 20

## 2014-05-05 MED ORDER — GABAPENTIN 100 MG PO CAPS
200.0000 mg | ORAL_CAPSULE | Freq: Three times a day (TID) | ORAL | Status: DC
  Administered 2014-05-05 – 2014-05-07 (×6): 200 mg via ORAL
  Filled 2014-05-05 (×6): qty 2

## 2014-05-05 MED ORDER — PHENYLEPHRINE 40 MCG/ML (10ML) SYRINGE FOR IV PUSH (FOR BLOOD PRESSURE SUPPORT)
PREFILLED_SYRINGE | INTRAVENOUS | Status: AC
  Filled 2014-05-05: qty 10

## 2014-05-05 MED ORDER — STERILE WATER FOR INJECTION IJ SOLN
INTRAMUSCULAR | Status: AC
  Filled 2014-05-05: qty 10

## 2014-05-05 MED ORDER — LACTATED RINGERS IV SOLN
INTRAVENOUS | Status: DC | PRN
  Administered 2014-05-05 (×4): via INTRAVENOUS

## 2014-05-05 MED ORDER — PRAMIPEXOLE DIHYDROCHLORIDE 0.125 MG PO TABS
0.2500 mg | ORAL_TABLET | Freq: Every evening | ORAL | Status: DC | PRN
  Administered 2014-05-06 – 2014-05-07 (×3): 0.25 mg via ORAL
  Filled 2014-05-05 (×3): qty 2

## 2014-05-05 MED ORDER — 0.9 % SODIUM CHLORIDE (POUR BTL) OPTIME
TOPICAL | Status: DC | PRN
  Administered 2014-05-05: 1000 mL

## 2014-05-05 MED ORDER — CITALOPRAM HYDROBROMIDE 20 MG PO TABS
20.0000 mg | ORAL_TABLET | Freq: Every day | ORAL | Status: DC
  Administered 2014-05-06 – 2014-05-07 (×2): 20 mg via ORAL
  Filled 2014-05-05 (×2): qty 1

## 2014-05-05 MED ORDER — MIDAZOLAM HCL 2 MG/2ML IJ SOLN: 0.5000 mg | Freq: Once | INTRAMUSCULAR | Status: DC | PRN

## 2014-05-05 MED ORDER — LIDOCAINE HCL (CARDIAC) 20 MG/ML IV SOLN
INTRAVENOUS | Status: DC | PRN
  Administered 2014-05-05: 40 mg via INTRAVENOUS

## 2014-05-05 MED ORDER — FENTANYL CITRATE 0.05 MG/ML IJ SOLN
INTRAMUSCULAR | Status: AC
  Filled 2014-05-05: qty 5

## 2014-05-05 MED ORDER — SODIUM CHLORIDE 0.9 % IJ SOLN: 3.0000 mL | INTRAMUSCULAR | Status: DC | PRN

## 2014-05-05 MED ORDER — DOCUSATE SODIUM 100 MG PO CAPS
100.0000 mg | ORAL_CAPSULE | Freq: Two times a day (BID) | ORAL | Status: DC
  Administered 2014-05-05 – 2014-05-07 (×4): 100 mg via ORAL
  Filled 2014-05-05 (×4): qty 1

## 2014-05-05 MED ORDER — DEXAMETHASONE SODIUM PHOSPHATE 4 MG/ML IJ SOLN: 4.0000 mg | Freq: Four times a day (QID) | INTRAMUSCULAR | Status: AC

## 2014-05-05 MED ORDER — VECURONIUM BROMIDE 10 MG IV SOLR
INTRAVENOUS | Status: DC | PRN
  Administered 2014-05-05 (×3): 2 mg via INTRAVENOUS

## 2014-05-05 MED ORDER — HYDROMORPHONE HCL PF 1 MG/ML IJ SOLN
1.0000 mg | INTRAMUSCULAR | Status: DC | PRN
  Administered 2014-05-05 – 2014-05-06 (×3): 1 mg via INTRAMUSCULAR
  Filled 2014-05-05 (×3): qty 1

## 2014-05-05 MED ORDER — METHOCARBAMOL 1000 MG/10ML IJ SOLN
500.0000 mg | Freq: Four times a day (QID) | INTRAMUSCULAR | Status: DC | PRN
  Filled 2014-05-05: qty 5

## 2014-05-05 MED ORDER — METHOCARBAMOL 500 MG PO TABS
500.0000 mg | ORAL_TABLET | Freq: Four times a day (QID) | ORAL | Status: DC | PRN
  Administered 2014-05-05 – 2014-05-07 (×3): 500 mg via ORAL
  Filled 2014-05-05 (×2): qty 1

## 2014-05-05 MED ORDER — BACITRACIN 50000 UNITS IM SOLR
INTRAMUSCULAR | Status: DC | PRN
  Administered 2014-05-05 (×2)

## 2014-05-05 MED ORDER — SUCCINYLCHOLINE CHLORIDE 20 MG/ML IJ SOLN
INTRAMUSCULAR | Status: DC | PRN
  Administered 2014-05-05: 100 mg via INTRAVENOUS

## 2014-05-05 MED ORDER — FENTANYL CITRATE 0.05 MG/ML IJ SOLN
INTRAMUSCULAR | Status: DC | PRN
  Administered 2014-05-05 (×2): 50 ug via INTRAVENOUS
  Administered 2014-05-05: 100 ug via INTRAVENOUS
  Administered 2014-05-05 (×2): 50 ug via INTRAVENOUS
  Administered 2014-05-05: 200 ug via INTRAVENOUS
  Administered 2014-05-05 (×2): 50 ug via INTRAVENOUS

## 2014-05-05 MED ORDER — SIMVASTATIN 20 MG PO TABS
20.0000 mg | ORAL_TABLET | Freq: Every evening | ORAL | Status: DC
  Administered 2014-05-05 – 2014-05-06 (×2): 20 mg via ORAL
  Filled 2014-05-05 (×2): qty 1

## 2014-05-05 MED ORDER — PHENYLEPHRINE HCL 10 MG/ML IJ SOLN
INTRAMUSCULAR | Status: DC | PRN
  Administered 2014-05-05: 80 ug via INTRAVENOUS

## 2014-05-05 MED ORDER — THROMBIN 20000 UNITS EX SOLR
CUTANEOUS | Status: DC | PRN
  Administered 2014-05-05 (×3): via TOPICAL

## 2014-05-05 MED ORDER — HYDROCODONE-ACETAMINOPHEN 5-325 MG PO TABS
1.0000 | ORAL_TABLET | ORAL | Status: DC | PRN
  Administered 2014-05-05 – 2014-05-07 (×10): 2 via ORAL
  Filled 2014-05-05 (×9): qty 2

## 2014-05-05 MED ORDER — METHOCARBAMOL 500 MG PO TABS
ORAL_TABLET | ORAL | Status: AC
  Filled 2014-05-05: qty 1

## 2014-05-05 MED ORDER — SODIUM CHLORIDE 0.9 % IJ SOLN
INTRAMUSCULAR | Status: AC
  Filled 2014-05-05: qty 10

## 2014-05-05 MED ORDER — SODIUM CHLORIDE 0.9 % IV SOLN: 250.0000 mL | INTRAVENOUS | Status: DC

## 2014-05-05 MED ORDER — EPHEDRINE SULFATE 50 MG/ML IJ SOLN
INTRAMUSCULAR | Status: DC | PRN
  Administered 2014-05-05: 15 mg via INTRAVENOUS
  Administered 2014-05-05: 10 mg via INTRAVENOUS

## 2014-05-05 MED ORDER — OXYCODONE HCL 5 MG PO TABS
5.0000 mg | ORAL_TABLET | Freq: Once | ORAL | Status: AC | PRN
  Administered 2014-05-05: 5 mg via ORAL

## 2014-05-05 MED ORDER — SODIUM CHLORIDE 0.9 % IJ SOLN
3.0000 mL | Freq: Two times a day (BID) | INTRAMUSCULAR | Status: DC
  Administered 2014-05-05 – 2014-05-07 (×4): 3 mL via INTRAVENOUS

## 2014-05-05 MED ORDER — OXYCODONE HCL 5 MG PO TABS
ORAL_TABLET | ORAL | Status: AC
  Filled 2014-05-05: qty 1

## 2014-05-05 MED ORDER — DEXAMETHASONE 4 MG PO TABS
4.0000 mg | ORAL_TABLET | Freq: Four times a day (QID) | ORAL | Status: AC
  Administered 2014-05-05 – 2014-05-06 (×2): 4 mg via ORAL
  Filled 2014-05-05 (×2): qty 1

## 2014-05-05 MED ORDER — MEPERIDINE HCL 25 MG/ML IJ SOLN: 6.2500 mg | INTRAMUSCULAR | Status: DC | PRN

## 2014-05-05 MED ORDER — HYDROMORPHONE HCL PF 1 MG/ML IJ SOLN
INTRAMUSCULAR | Status: AC
  Filled 2014-05-05: qty 1

## 2014-05-05 MED ORDER — ALBUTEROL SULFATE (2.5 MG/3ML) 0.083% IN NEBU: 2.5000 mg | INHALATION_SOLUTION | Freq: Four times a day (QID) | RESPIRATORY_TRACT | Status: DC | PRN

## 2014-05-05 MED ORDER — SUCCINYLCHOLINE CHLORIDE 20 MG/ML IJ SOLN
INTRAMUSCULAR | Status: AC
  Filled 2014-05-05: qty 1

## 2014-05-05 MED ORDER — ONDANSETRON HCL 4 MG/2ML IJ SOLN
INTRAMUSCULAR | Status: AC
  Filled 2014-05-05: qty 2

## 2014-05-05 MED ORDER — EPHEDRINE SULFATE 50 MG/ML IJ SOLN
INTRAMUSCULAR | Status: AC
  Filled 2014-05-05: qty 1

## 2014-05-05 MED ORDER — ACETAMINOPHEN 650 MG RE SUPP: 650.0000 mg | RECTAL | Status: DC | PRN

## 2014-05-05 MED ORDER — ACETAMINOPHEN 325 MG PO TABS: 650.0000 mg | ORAL_TABLET | ORAL | Status: DC | PRN

## 2014-05-05 MED ORDER — ARTIFICIAL TEARS OP OINT
TOPICAL_OINTMENT | OPHTHALMIC | Status: AC
  Filled 2014-05-05: qty 3.5

## 2014-05-05 MED ORDER — ROCURONIUM BROMIDE 100 MG/10ML IV SOLN
INTRAVENOUS | Status: DC | PRN
  Administered 2014-05-05: 10 mg via INTRAVENOUS
  Administered 2014-05-05: 40 mg via INTRAVENOUS

## 2014-05-05 SURGICAL SUPPLY — 73 items
ADH SKN CLS APL DERMABOND .7 (GAUZE/BANDAGES/DRESSINGS)
APL SKNCLS STERI-STRIP NONHPOA (GAUZE/BANDAGES/DRESSINGS) ×2
BAG DECANTER FOR FLEXI CONT (MISCELLANEOUS) ×3 IMPLANT
BENZOIN TINCTURE PRP APPL 2/3 (GAUZE/BANDAGES/DRESSINGS) ×6 IMPLANT
BLADE 10 SAFETY STRL DISP (BLADE) ×3 IMPLANT
BLADE SURG ROTATE 9660 (MISCELLANEOUS) ×3 IMPLANT
BONE EQUIVA 10CC (Bone Implant) ×2 IMPLANT
BRUSH SCRUB EZ PLAIN DRY (MISCELLANEOUS) ×3 IMPLANT
BUR CUTTER 7.0 ROUND (BURR) ×3 IMPLANT
BUR MATCHSTICK NEURO 3.0 LAGG (BURR) ×3 IMPLANT
CANISTER SUCT 3000ML (MISCELLANEOUS) ×3 IMPLANT
CLOSURE WOUND 1/2 X4 (GAUZE/BANDAGES/DRESSINGS) ×2
CONT SPEC 4OZ CLIKSEAL STRL BL (MISCELLANEOUS) ×6 IMPLANT
COVER BACK TABLE 24X17X13 BIG (DRAPES) IMPLANT
COVER TABLE BACK 60X90 (DRAPES) ×3 IMPLANT
DERMABOND ADVANCED (GAUZE/BANDAGES/DRESSINGS)
DERMABOND ADVANCED .7 DNX12 (GAUZE/BANDAGES/DRESSINGS) IMPLANT
DRAPE C-ARM 42X72 X-RAY (DRAPES) ×6 IMPLANT
DRAPE LAPAROTOMY 100X72X124 (DRAPES) ×3 IMPLANT
DRAPE SURG 17X23 STRL (DRAPES) ×6 IMPLANT
DRSG OPSITE POSTOP 4X6 (GAUZE/BANDAGES/DRESSINGS) ×3 IMPLANT
DRSG TELFA 3X8 NADH (GAUZE/BANDAGES/DRESSINGS) ×3 IMPLANT
DURAPREP 26ML APPLICATOR (WOUND CARE) ×3 IMPLANT
ELECT REM PT RETURN 9FT ADLT (ELECTROSURGICAL) ×3
ELECTRODE REM PT RTRN 9FT ADLT (ELECTROSURGICAL) ×1 IMPLANT
EVACUATOR 1/8 PVC DRAIN (DRAIN) ×5 IMPLANT
GAUZE SPONGE 4X4 16PLY XRAY LF (GAUZE/BANDAGES/DRESSINGS) ×4 IMPLANT
GLOVE BIOGEL PI IND STRL 7.5 (GLOVE) IMPLANT
GLOVE BIOGEL PI IND STRL 8 (GLOVE) IMPLANT
GLOVE BIOGEL PI INDICATOR 7.5 (GLOVE) ×4
GLOVE BIOGEL PI INDICATOR 8 (GLOVE) ×2
GLOVE ECLIPSE 7.5 STRL STRAW (GLOVE) ×4 IMPLANT
GLOVE ECLIPSE 8.0 STRL XLNG CF (GLOVE) ×6 IMPLANT
GLOVE SURG SS PI 7.0 STRL IVOR (GLOVE) ×12 IMPLANT
GOWN STRL REUS W/ TWL LRG LVL3 (GOWN DISPOSABLE) IMPLANT
GOWN STRL REUS W/ TWL XL LVL3 (GOWN DISPOSABLE) ×2 IMPLANT
GOWN STRL REUS W/TWL 2XL LVL3 (GOWN DISPOSABLE) IMPLANT
GOWN STRL REUS W/TWL LRG LVL3 (GOWN DISPOSABLE) ×9
GOWN STRL REUS W/TWL XL LVL3 (GOWN DISPOSABLE) ×6
IMPLANT PEEK ARDIS 8X11X30MM (Orthopedic Implant) ×2 IMPLANT
K-WIRE NITHNOL TROCAR TIP (WIRE) ×12 IMPLANT
KIT BASIN OR (CUSTOM PROCEDURE TRAY) ×3 IMPLANT
KIT ROOM TURNOVER OR (KITS) ×3 IMPLANT
NEEDLE HYPO 22GX1.5 SAFETY (NEEDLE) ×3 IMPLANT
NEEDLE TARGETING (NEEDLE) ×12 IMPLANT
NS IRRIG 1000ML POUR BTL (IV SOLUTION) ×3 IMPLANT
PACK LAMINECTOMY NEURO (CUSTOM PROCEDURE TRAY) ×3 IMPLANT
PAD ARMBOARD 7.5X6 YLW CONV (MISCELLANEOUS) ×9 IMPLANT
PAD DRESSING TELFA 3X8 NADH (GAUZE/BANDAGES/DRESSINGS) ×1 IMPLANT
PATTIES SURGICAL .75X.75 (GAUZE/BANDAGES/DRESSINGS) ×3 IMPLANT
PEDICLE ACCESS TOOL ×1 IMPLANT
PEEK OPTIMA 9X9X26MM (Peek) ×4 IMPLANT
ROD PRE BENT PERC 60MM (Rod) ×2 IMPLANT
ROD PREBENT PREC 65MM (Rod) ×2 IMPLANT
SCREW MIN INVASIVE 6.5X45 (Screw) ×8 IMPLANT
SCREW POLYAXIA MIS 6.5X40MM (Screw) ×4 IMPLANT
SHEATH PAT (SHEATH) ×2 IMPLANT
SPONGE GAUZE 4X4 12PLY (GAUZE/BANDAGES/DRESSINGS) ×3 IMPLANT
SPONGE LAP 4X18 X RAY DECT (DISPOSABLE) IMPLANT
SPONGE SURGIFOAM ABS GEL 100 (HEMOSTASIS) ×3 IMPLANT
STRIP CLOSURE SKIN 1/2X4 (GAUZE/BANDAGES/DRESSINGS) ×4 IMPLANT
SUT PROLENE 0 CT 1 30 (SUTURE) IMPLANT
SUT VIC AB 0 CT1 18XCR BRD8 (SUTURE) ×1 IMPLANT
SUT VIC AB 0 CT1 8-18 (SUTURE) ×6
SUT VIC AB 2-0 OS6 18 (SUTURE) ×9 IMPLANT
SUT VIC AB 3-0 CP2 18 (SUTURE) ×5 IMPLANT
SYR 20ML ECCENTRIC (SYRINGE) ×3 IMPLANT
TOP CLSR SEQUOIA (Orthopedic Implant) ×12 IMPLANT
TOWEL OR 17X24 6PK STRL BLUE (TOWEL DISPOSABLE) ×3 IMPLANT
TOWEL OR 17X26 10 PK STRL BLUE (TOWEL DISPOSABLE) ×3 IMPLANT
TRAP SPECIMEN MUCOUS 40CC (MISCELLANEOUS) ×2 IMPLANT
TRAY FOLEY CATH 14FRSI W/METER (CATHETERS) ×3 IMPLANT
WATER STERILE IRR 1000ML POUR (IV SOLUTION) ×3 IMPLANT

## 2014-05-05 NOTE — Op Note (Signed)
Preop diagnosis: Spinal stenosis L3-4 L4-5 with complete myelographic block and L3-4 spondylolisthesis Postop diagnosis: Same Procedure: L3-4 L4-5 bilateral decompressive laminectomy with relief of the central and lateral recess stenosis L3-4 L4-5 bilateral microdiscectomy L3-4 L4-5 posterior lumbar interbody fusion with peek interbody spacer Segmental instrumentation with Pathfinder percutaneous pedicle screw system Surgeon: Kritzer Assistant: Sherwood Gambler  After being placed the prone position the patient's back was prepped and draped in usual sterile fashion. Localizing processes used prior to incision to identify the appropriate level. Midline incision was made above the spinous processes of L3-L4 and L5. Incision was carried down to the dorsolumbar fascia. The plane between the fascia and subcutaneous fat was dissected free. We then did a subperiosteal dissection along the spinous processes of L3-L4 and L5. We chose the spinous processes lamina facet joints bilaterally at both levels. Self-retaining retractor was placed for exposure and x-ray showed approach the appropriate levels. Using the [place] rongeur spinous processes of L3-L4 and L5 were removed. Starting the patient's left side generous laminotomies were performed removing the inferior two thirds of the L3 lamina the medial two thirds the facet joint the superior one half of the L4 lamina. We then removed the superior one half of the L4 lamina the medial three quarters of the facet joint L4-5 and the superior one half of the L5 lamina. Residual bone and hypertrophic ligamentum flavum removed in a piecemeal fashion. Similar decompression was carried on the opposite side and residual midline structures were removed to complete the bilateral decompression. We did generous lateral recess stenosis as well as resolving the severe central stenosis. At this time we identified the disc spaces bilaterally thoroughly cleaned out with pituitary rongeurs and  curettes. Thorough displaced cleanout was carried on the same time great care was taken to injury to the garments this was successfully done. We then prepared the disc for interbody fusion. We distracted the L3-4 disc up to a 9 mm size. We dissected the L4-5 disc up to an 8 mm size. On the left side at L4-5 the nerve was extremely scarred and we felt we could not safely placed a cage we displaced a wider cage on the right side at L4-5. We prepared the disc for interbody fusion at L3-4. His right of instruments to clean off the endplates. We placed a 9 x 9 x 26 mm cage that level in excellent position. Removed the distractor in place a cage on the opposite side. Prior to placing the second cage we placed a mixture of autologous bone morselized allograft it within the interspace to help with interbody fusion. At L4-5 we placed a single cage from the right side which was an 8 x 11 I 30 mm cage. We cannot get to the lateral position to the marked collapse on the opposite side of the right, symmetric side was a markedly opened and nerve were decompressed well on the left side we could not put a cage. We irrigated copiously this time controlled any bleeding with upper coagulation Gelfoam. We closed the midline dorsal fascia with 0 Vicryl and then placed percutaneous pedicle screws in standard fashion. We placed Jamshidi needles to the pedicles from lateral to medial direction placed guidewire through them and then removed the needles. We connected the fascial openings and then tapped with the 6.0 mm tap. We placed 6.5 x 45 mm screws at L4 and L5 and 6.5 x 40 Miller screws at L3. We then chose appropriate length rods custom down through the [place]iner secured them  to the top of the screws with the top loading nuts. We then did tightening and final tightening with torque and counter torque. Fluoroscopy in AP lateral direction showed excellent position of the screws rods and interbody spacers was removed the [place]. We irrigated  this wound once more copiously metabolic irrigation. Left drain superficial to the lumbar dorsal fascia brought out through separate stab incision. We then closed the wound in multiple layers of Vicryl and a running locking Prolene on the skin. Shortness was then applied and the patient was extubated and taken to recovery in stable condition.

## 2014-05-05 NOTE — Anesthesia Preprocedure Evaluation (Addendum)
Anesthesia Evaluation  Patient identified by MRN, date of birth, ID band Patient awake    Reviewed: Allergy & Precautions, H&P , NPO status , Patient's Chart, lab work & pertinent test results  History of Anesthesia Complications Negative for: history of anesthetic complications  Airway Mallampati: III TM Distance: <3 FB Neck ROM: Full    Dental  (+) Teeth Intact, Dental Advisory Given   Pulmonary asthma , sleep apnea and Continuous Positive Airway Pressure Ventilation , COPD COPD inhaler,  breath sounds clear to auscultation  Pulmonary exam normal       Cardiovascular hypertension, Rhythm:Regular Rate:Normal  4/15 Stress test: no ischemia, normal perfusion, EF 68% 4/15 ECHO: normal LVF, EF 60%, mod MR   Neuro/Psych Chronic back pain: narcotics    GI/Hepatic negative GI ROS, Neg liver ROS,   Endo/Other  Morbid obesity  Renal/GU negative Renal ROS     Musculoskeletal   Abdominal (+) + obese,   Peds  Hematology  (+) Blood dyscrasia (Hb 11.3), anemia ,   Anesthesia Other Findings Ovarian cancer: chemo  Reproductive/Obstetrics                        Anesthesia Physical Anesthesia Plan  ASA: III  Anesthesia Plan: General   Post-op Pain Management:    Induction: Intravenous  Airway Management Planned: Oral ETT  Additional Equipment:   Intra-op Plan:   Post-operative Plan: Extubation in OR  Informed Consent: I have reviewed the patients History and Physical, chart, labs and discussed the procedure including the risks, benefits and alternatives for the proposed anesthesia with the patient or authorized representative who has indicated his/her understanding and acceptance.   Dental advisory given  Plan Discussed with: Surgeon and CRNA  Anesthesia Plan Comments: (Plan routine monitors, GETA)        Anesthesia Quick Evaluation

## 2014-05-05 NOTE — Transfer of Care (Signed)
Immediate Anesthesia Transfer of Care Note  Patient: Lisa Becker  Procedure(s) Performed: Procedure(s) with comments: POSTERIOR LUMBAR FUSION LUMBAR THREE-FOUR, FOUR-FIVE (N/A) - POSTERIOR LUMBAR FUSION LUMBAR THREE-FOUR, FOUR-FIVE  Patient Location: PACU  Anesthesia Type:General  Level of Consciousness: awake and oriented  Airway & Oxygen Therapy: Patient Spontanous Breathing and Patient connected to nasal cannula oxygen  Post-op Assessment: Report given to PACU RN and Patient moving all extremities X 4  Post vital signs: Reviewed and stable  Complications: No apparent anesthesia complications

## 2014-05-05 NOTE — Progress Notes (Signed)
SCD's on Cori Razor, RN 05/05/2014

## 2014-05-05 NOTE — Progress Notes (Signed)
Utilization review completed.  

## 2014-05-05 NOTE — H&P (Signed)
Lisa Becker is an 80 y.o. female.   Chief Complaint: Back and right leg pain HPI: The patient is a 80 year old female with a history of back surgery in 2003. She did well at that time but now complains of right leg pain which has been going on for about a year. She is in pain management and had injections without relief. An MRI scan was done many months ago she came for evaluation. When seen in the office the patient denied and left leg pain. The pain associated injection had use a walker. Her MRI scan was reviewed but because was a number of months old she underwent myelography. There was severe stenosis at L3-4 and L4-5 with a near complete block. There was also grade 1 spondylolisthesis. After discussing the options the patient requested surgery due to her marked debilitation and lack of improvement with conservative therapy. She now comes for a two-level posterior lumbar interbody fusion with pedicle screw fixation. I've had a long discussion with her regarding the risks and benefits of surgical intervention. The risks discussed include but are not limited to bleeding infection weakness numbness paralysis trouble with instrumentation nonunion coma and death. We have discussed alternative methods of therapy offered risks and benefits of nonintervention. She's had the opportunity to ask numerous questions. With this information in a well-appearing understand she has requested we proceed with surgery.  Past Medical History  Diagnosis Date  . Hypertension   . Anemia   . Sleep apnea     CPAP in use q night   . Pulmonary hypertension     followed by Dr. Raul Del, Jef[facility] Bryant, PFT's done at that last visit- mid April- 2015  . Shortness of breath   . Depression   . Nervous indigestion     uses one TUM, if this occurs (maybe once per month)    . Arthritis     lumbar stenosis  . Neuromuscular disorder     neuropathy hands, feet, legs   . Cancer 2010    ovarian, reoccurence - 2013, treatment  completed through porta cath. 2014     Past Surgical History  Procedure Laterality Date  . Total hip arthroplasty  2013  . Knee surgery Left 2006  . Eus  08/20/2012    Procedure: UPPER ENDOSCOPIC ULTRASOUND (EUS) LINEAR;  Surgeon: Milus Banister, MD;  Location: [facility];  Service: Endoscopy;  Laterality: N/A;  radial/ linear patti/ebp   . Joint replacement Right     both knees have been replaced    . Tonsillectomy    . Abdominal hysterectomy    . Back surgery  2003    prior to 2003 episode, had back surgery (lumbar)- 1989  . Laparotomy  2013    for mass in abdomen     History reviewed. No pertinent family history. Social History:  reports that she has never smoked. She does not have any smokeless tobacco history on file. She reports that she does not drink alcohol or use illicit drugs.  Allergies:  Allergies  Allergen Reactions  . Amlodipine Swelling    "leg swelling"  . Metoclopramide Other (See Comments)    "interferes with my pramipexole"  . Metronidazole Diarrhea    "upset stomach"  . Tramadol Hcl Er Other (See Comments)    "hold fluid"    Medications Prior to Admission  Medication Sig Dispense Refill  . albuterol (PROVENTIL HFA;VENTOLIN HFA) 108 (90 BASE) MCG/ACT inhaler Inhale 2 puffs into the lungs every 6 (six) hours as needed  for wheezing or shortness of breath.      . Alpha-Lipoic Acid (LIPOIC ACID PO) Take 300 mg by mouth every evening.       Marland Kitchen ascorbic acid (VITAMIN C) 1000 MG tablet Take 1,000 mg by mouth daily with breakfast.       . aspirin 81 MG tablet Take 81 mg by mouth every evening.       . Calcium-Magnesium 500-250 MG TABS Take 1 tablet by mouth every evening.       . Cholecalciferol (VITAMIN D3) 2000 UNITS TABS Take 2,000 Units by mouth daily with breakfast.       . citalopram (CELEXA) 20 MG tablet Take 20 mg by mouth daily with breakfast.      . Coenzyme Q10-Fish Oil-Vit E (CO-Q 10 OMEGA-3 FISH OIL) CAPS Take 1 capsule by mouth 2 (two) times  daily.       . fluticasone-salmeterol (ADVAIR HFA) 115-21 MCG/ACT inhaler Inhale 2 puffs into the lungs 2 (two) times daily.      Marland Kitchen gabapentin (NEURONTIN) 100 MG capsule Take 200 mg by mouth 3 (three) times daily.       Marland Kitchen HYDROcodone-acetaminophen (NORCO/VICODIN) 5-325 MG per tablet Take 1 tablet by mouth every 6 (six) hours as needed (pain).       . Iron-Vitamin C (VITRON-C PO) Take 1 tablet by mouth daily with breakfast.       . OVER THE COUNTER MEDICATION Place 1-2 drops into both eyes 2 (two) times daily. [place] Eye Drops      . potassium chloride (MICRO-K) 10 MEQ CR capsule Take 20 mEq by mouth daily with breakfast.       . pramipexole (MIRAPEX) 0.25 MG tablet Take 0.25 mg by mouth at bedtime as needed and may repeat dose one time if needed (restless leg).       . simvastatin (ZOCOR) 20 MG tablet Take 20 mg by mouth every evening.      . torsemide (DEMADEX) 20 MG tablet Take 20 mg by mouth daily with breakfast.       . vitamin B-12 (CYANOCOBALAMIN) 1000 MCG tablet Take 1,000 mcg by mouth daily with breakfast.         Results for orders placed during the hospital encounter of 05/05/14 (from the past 48 hour(s))  ABO/RH     Status: None   Collection Time    05/03/14  2:09 PM      Result Value Ref Range   ABO/RH(D) B POS     Dg Chest 2 View  05/03/2014   CLINICAL DATA:  Preoperative evaluation.  Shortness of breath.  EXAM: CHEST  2 VIEW  COMPARISON:  Chest radiograph 10/15/2012  FINDINGS: Left chest wall Port-A-Cath is present with tip projecting over the superior vena cava, stable. Stable enlarged cardiac and mediastinal contours. No consolidative pulmonary opacities. No pleural effusion or pneumothorax. Regional skeleton is unremarkable.  IMPRESSION: No acute cardiopulmonary process.   Electronically Signed   By: Lovey Newcomer M.D.   On: 05/03/2014 15:43    Positive for asthma shortness of breath she denies a problem status post chest pain gastrointestinal or genitourinary problem  Blood  pressure 140/52, pulse 72, temperature 98.2 F (36.8 C), temperature source Oral, resp. rate 18, weight 98.884 kg (218 lb), SpO2 98.00%.  The patient is awake alert and oriented. Her gait is normal. Strength is 5 over 5. Sensation is intact. Reflexes are decreased but equal. Assessment/Plan Impression is that of two-level lumbar stenosis with listhesis. The  plan is for a two-level decompression interbody fusion with pedicle screw fixation.  Faythe Ghee, MD 05/05/2014, 7:24 AM

## 2014-05-05 NOTE — Anesthesia Procedure Notes (Signed)
Procedure Name: Intubation Date/Time: 05/05/2014 7:42 AM Performed by: Barrington Ellison Pre-anesthesia Checklist: Patient identified, Emergency Drugs available, Suction available, Patient being monitored and Timeout performed Patient Re-evaluated:Patient Re-evaluated prior to inductionOxygen Delivery Method: Circle system utilized Preoxygenation: Pre-oxygenation with 100% oxygen Intubation Type: IV induction and Rapid sequence Ventilation: Mask ventilation with difficulty, Two handed mask ventilation required and Oral airway inserted - appropriate to patient size Laryngoscope Size: Mac Grade View: Grade I Tube type: Oral Tube size: 7.5 mm Number of attempts: 1 Airway Equipment and Method: Stylet and Video-laryngoscopy Placement Confirmation: ETT inserted through vocal cords under direct vision,  positive ETCO2 and breath sounds checked- equal and bilateral Secured at: 22 cm Tube secured with: Tape Dental Injury: Teeth and Oropharynx as per pre-operative assessment and Injury to lip  Difficulty Due To: Difficulty was anticipated and Difficult Airway- due to limited oral opening Future Recommendations: Recommend- induction with short-acting agent, and alternative techniques readily available Comments: Patient has large neck circumference and small oral opening. Difficult to mask even with oral airway. RSI recommended.

## 2014-05-05 NOTE — Progress Notes (Signed)
Pt arrived to unit from PACU.  Welcomed and oriented to unit.  Pt settled into bed.  Family at bedside.   Cori Razor, RN 05/05/2014

## 2014-05-05 NOTE — Anesthesia Postprocedure Evaluation (Signed)
  Anesthesia Post-op Note  Patient: Lisa Becker  Procedure(s) Performed: Procedure(s) with comments: POSTERIOR LUMBAR FUSION LUMBAR THREE-FOUR, FOUR-FIVE (N/A) - POSTERIOR LUMBAR FUSION LUMBAR THREE-FOUR, FOUR-FIVE  Patient Location: PACU  Anesthesia Type:General  Level of Consciousness: awake, alert , oriented and patient cooperative  Airway and Oxygen Therapy: Patient Spontanous Breathing and Patient connected to nasal cannula oxygen  Post-op Pain: 3 /10, mild  Post-op Assessment: Post-op Vital signs reviewed, Patient's Cardiovascular Status Stable, Respiratory Function Stable, Patent Airway, No signs of Nausea or vomiting and Pain level controlled  Post-op Vital Signs: Reviewed and stable  Last Vitals:  Filed Vitals:   05/05/14 1500  BP: 132/51  Pulse: 86  Temp:   Resp: 18    Complications: No apparent anesthesia complications

## 2014-05-06 NOTE — Progress Notes (Signed)
Patient ID: Lisa Becker, female   DOB: 01-15-1934, 80 y.o.   MRN: [id number] Afeb, vss No new neuro issues. Feels much better than before surgery. Walked a few times today. Will walk more tonight and in AM and then probable d/c tomorrow.

## 2014-05-06 NOTE — Evaluation (Signed)
Physical Therapy Evaluation Patient Details Name: Lisa Becker MRN: [id number] DOB: 10-10-1934 Today's Date: 05/06/2014   History of Present Illness  s/p posterior lumbar fusion L3-L5  Clinical Impression  Patient is s/p surgery listed above resulting in the deficits listed below (see PT Problem List).  Patient will benefit from skilled PT to increase their independence and safety with mobility (while adhering to their precautions) to allow discharge home. Pt has husband at home but he cannot provide physical (A), will have daughter for weekend and after work during weekdays.      Follow Up Recommendations Supervision/Assistance - 24 hour;No PT follow up    Equipment Recommendations  3in1 (PT)    Recommendations for Other Services OT consult     Precautions / Restrictions Precautions Precautions: Back;Fall Precaution Booklet Issued: Yes (comment) Precaution Comments: pt given handout and educated throughout session on precautions  Required Braces or Orthoses: Spinal Brace Spinal Brace: Applied in sitting position Restrictions Weight Bearing Restrictions: No      Mobility  Bed Mobility               General bed mobility comments: pt up in recliner; returned to recliner   Transfers Overall transfer level: Needs assistance Equipment used: Rolling walker (2 wheeled) Transfers: Sit to/from Stand Sit to Stand: Mod assist         General transfer comment: (A) to achieve standing position due to increased difficulty secondary to pain; cues for hand placement and safety with RW  Ambulation/Gait Ambulation/Gait assistance: Min guard Ambulation Distance (Feet): 60 Feet Assistive device: Rolling walker (2 wheeled) Gait Pattern/deviations: Step-through pattern;Decreased stride length;Wide base of support Gait velocity: decreaed; guarded due to pain  Gait velocity interpretation: Below normal speed for age/gender General Gait Details: min guard to steady with gt; pt  limited due to pain and generalized fatigue; no LOB; min cues for back precautions   Stairs            Wheelchair Mobility    Modified Rankin (Stroke Patients Only)       Balance Overall balance assessment: Needs assistance         Standing balance support: During functional activity;Bilateral upper extremity supported Standing balance-Leahy Scale: Poor Standing balance comment: relies heavily on RW for UE support                             Pertinent Vitals/Pain 4/10; premedicated     Home Living Family/patient expects to be discharged to:: Private residence Living Arrangements: Spouse/significant other Available Help at Discharge: Family;Available 24 hours/day Type of Home: House Home Access: Stairs to enter Entrance Stairs-Rails: Left Entrance Stairs-Number of Steps: 3 Home Layout: One level Home Equipment: Walker - 2 wheels;Walker - 4 wheels;Cane - single point;Shower seat;Hand held shower head Additional Comments: husband cannot provide physical (A)     Prior Function Level of Independence: Independent with assistive device(s)         Comments: ambulated with SPC in community and rollator in house      Hand Dominance        Extremity/Trunk Assessment   Upper Extremity Assessment: Defer to OT evaluation           Lower Extremity Assessment: Generalized weakness (from surgery)      Cervical / Trunk Assessment: Normal  Communication   Communication: No difficulties  Cognition Arousal/Alertness: Awake/alert Behavior During Therapy: WFL for tasks assessed/performed Overall Cognitive Status: Within Functional Limits for  tasks assessed                      General Comments      Exercises        Assessment/Plan    PT Assessment Patient needs continued PT services  PT Diagnosis Difficulty walking;Acute pain;Generalized weakness   PT Problem List Decreased strength;Decreased activity tolerance;Decreased  balance;Decreased mobility;Decreased knowledge of precautions;Decreased knowledge of use of DME;Pain  PT Treatment Interventions DME instruction;Gait training;Stair training;Therapeutic activities;Therapeutic exercise;Functional mobility training;Balance training;Neuromuscular re-education;Patient/family education   PT Goals (Current goals can be found in the Care Plan section) Acute Rehab PT Goals Patient Stated Goal: to be safe to go home PT Goal Formulation: With patient Time For Goal Achievement: 05/09/14 Potential to Achieve Goals: Good    Frequency Min 5X/week   Barriers to discharge Decreased caregiver support husband cannot provide physical (A); daughter does live next door but works full time during week     Co-evaluation               End of Session Equipment Utilized During Treatment: Gait belt;Back brace Activity Tolerance: Patient limited by fatigue;Patient limited by pain Patient left: in chair;with call bell/phone within reach;with family/visitor present Nurse Communication: Mobility status;Weight bearing status;Precautions         Time: 0750-0808 PT Time Calculation (min): 18 min   Charges:   PT Evaluation $Initial PT Evaluation Tier I: 1 Procedure PT Treatments $Gait Training: 8-22 mins   PT G CodesGustavus Bryant, [place] 05/06/2014, 8:19 AM

## 2014-05-06 NOTE — Progress Notes (Signed)
Found patient to be laying in bed with hemovac pulled out at bedside. Patient unaware of removal of hemovac. Measured 50 ml of drainage before discarding. Dressing appears clean, dry, and intact with old drainage. Will continue to monitor.

## 2014-05-06 NOTE — Progress Notes (Signed)
Patient ambulated in hall. Educated on use of back brace. Foley removed at [phone]. Waiting on patient to void later this AM or today.

## 2014-05-06 NOTE — Progress Notes (Signed)
Occupational Therapy Evaluation Patient Details Name: Lisa Becker MRN: [id number] DOB: May 22, 1934 Today's Date: 05/06/2014    History of Present Illness Pt s/p PLIF L3-L5   Clinical Impression   PTA pt was independent with ADLs and used [facility] and rollator for functional mobility. Pt reports that her husband will be home 24/7 however he is unable to provide physical assistance. Pt's daughter lives close, however she works during the day. Educated pt on use of AE with return demonstration by pt for LB ADLs. Pt would benefit from skilled OT to progress to Mod Independent level prior to d/c home.     Follow Up Recommendations  No OT follow up;Supervision - Intermittent    Equipment Recommendations  Other (comment) (AE hip kit- purchased by daughter during OT session)       Precautions / Restrictions Precautions Precautions: Back;Fall Precaution Booklet Issued: Yes (comment) Precaution Comments: Educated pt on 3/3 back precautions and incorporating into ADLs.  Required Braces or Orthoses: Spinal Brace Spinal Brace: Applied in sitting position;Lumbar corset Restrictions Weight Bearing Restrictions: No      Mobility Bed Mobility               General bed mobility comments: pt up in recliner; returned to recliner. Explained sequencing for log roll and bed mobility with pt; to be practiced at a later date  Transfers Overall transfer level: Needs assistance Equipment used: Rolling walker (2 wheeled) Transfers: Sit to/from [facility] Sit to Stand: Min assist Stand pivot transfers: Min assist       General transfer comment: Physical (A) to achieve standing position due to pain and weakness. VC's for sequencing and hand placement and safety with RW.    Balance Overall balance assessment: Needs assistance         Standing balance support: During functional activity;Bilateral upper extremity supported Standing balance-Leahy Scale: Poor Standing balance  comment: relies heavily on RW for UE support                            ADL Overall ADL's : Needs assistance/impaired Eating/Feeding: Independent;Sitting   Grooming: Set up;Sitting   Upper Body Bathing: Set up;Sitting   Lower Body Bathing: Minimal assistance;Sit to/from stand;With adaptive equipment   Upper Body Dressing : Set up;Sitting   Lower Body Dressing: Minimal assistance;Sit to/from stand;With adaptive equipment   Toilet Transfer: Minimal assistance;Stand-pivot;RW;BSC   Toileting- Clothing Manipulation and Hygiene: Maximal assistance;Sit to/from stand Toileting - Clothing Manipulation Details (indicate cue type and reason): pt min (A) to stand, however required max (A) for hygiene and pulling underwear up Tub/ Shower Transfer: Walk-in shower;Ambulation;Rolling walker;Shower seat;Min guard   Functional mobility during ADLs: Rolling walker;Min guard ((A) for RW management) General ADL Comments: Pt presents with generalized weakness following surgery, however has good functional mobility. Pt required min (A) to stand when given VC's for sequencing however pt is apt to rely on as much assistance as therapist will provide. Educated pt on incorporating back precautions into ADLs. Demonstrated use of AE with return demonstration for LB ADLs. Educated pt and daughter on environmental modifications, compensatory techniques, and fall prevention for safety with ADLs. Pt ambulated to [facility] x3 during OT session.      Vision  Pt reports no change from baseline.  No apparent visual deficits.                  Perception Perception Perception Tested?: No   Praxis Praxis Praxis  tested?: Within functional limits    Pertinent Vitals/Pain Pt reports soreness in surgical site.      Hand Dominance Right   Extremity/Trunk Assessment Upper Extremity Assessment Upper Extremity Assessment: Generalized weakness (surgical)   Lower Extremity Assessment Lower Extremity  Assessment: Generalized weakness (surgical)   Cervical / Trunk Assessment Cervical / Trunk Assessment: Normal   Communication Communication Communication: No difficulties   Cognition Arousal/Alertness: Awake/alert Behavior During Therapy: WFL for tasks assessed/performed Overall Cognitive Status: Within Functional Limits for tasks assessed                                Home Living Family/patient expects to be discharged to:: Private residence Living Arrangements: Spouse/significant other Available Help at Discharge: Family;Available 24 hours/day (Husband- but unable to help very much) Type of Home: House Home Access: Stairs to enter [employer] of Steps: 3 Entrance Stairs-Rails: Left Home Layout: One level     Bathroom Shower/Tub: Occupational psychologist: Standard     Home Equipment: Environmental consultant - 2 wheels;Walker - 4 wheels;Cane - single point;Shower seat;Hand held shower head   Additional Comments: husband cannot provide physical (A)       Prior Functioning/Environment Level of Independence: Independent with assistive device(s)        Comments: ambulated with SPC in community and rollator in house     OT Diagnosis: Generalized weakness;Acute pain   OT Problem List: Decreased strength;Decreased range of motion;Impaired balance (sitting and/or standing);Decreased activity tolerance;Decreased safety awareness;Decreased knowledge of use of DME or AE;Decreased knowledge of precautions;Pain   OT Treatment/Interventions: Self-care/ADL training;Energy conservation;DME and/or AE instruction;Therapeutic activities;Patient/family education;Balance training    OT Goals(Current goals can be found in the care plan section) Acute Rehab OT Goals Patient Stated Goal: to be safe to go home OT Goal Formulation: With patient Time For Goal Achievement: 05/13/14 Potential to Achieve Goals: Good ADL Goals Pt Will Perform Grooming: with modified  independence;with adaptive equipment;standing Pt Will Perform Lower Body Bathing: with modified independence;with adaptive equipment;sit to/from stand Pt Will Perform Lower Body Dressing: with modified independence;with adaptive equipment;sit to/from stand Pt Will Transfer to Toilet: with modified independence;ambulating;regular height toilet Pt Will Perform Toileting - Clothing Manipulation and hygiene: with modified independence;sit to/from stand;with adaptive equipment Pt Will Perform Tub/Shower Transfer: Shower transfer;with modified independence;ambulating;shower seat;rolling walker Additional ADL Goal #1: Pt will perform bed mobility using log roll technique at Mod Independent level to prepare for ADLs.   OT Frequency: Min 2X/week   Barriers to D/C: Decreased caregiver support  Pt's husband will be home, but unable to provide physical (A)          End of Session Equipment Utilized During Treatment: Gait belt;Rolling walker;Back brace  Activity Tolerance: Patient tolerated treatment well Patient left: in chair;with call bell/phone within reach;with chair alarm set;with family/visitor present   Time: 3903-0092 OT Time Calculation (min): 72 min Charges:  OT General Charges $OT Visit: 1 Procedure OT Evaluation $Initial OT Evaluation Tier I: 1 Procedure OT Treatments $Self Care/Home Management : 38-52 mins $Therapeutic Activity: 8-22 mins  Juluis Rainier [phone] 05/06/2014, 11:47 AM

## 2014-05-07 MED ORDER — OXYCODONE-ACETAMINOPHEN 10-325 MG PO TABS: 1.0000 | ORAL_TABLET | ORAL | Status: DC | PRN

## 2014-05-07 MED ORDER — DSS 100 MG PO CAPS: 100.0000 mg | ORAL_CAPSULE | Freq: Two times a day (BID) | ORAL | Status: DC

## 2014-05-07 NOTE — Progress Notes (Signed)
Physical Therapy Treatment Patient Details Name: Lisa Becker MRN: [id number] DOB: [birth year] Today's Date: 05/07/2014    History of Present Illness Pt s/p PLIF L3-L5    PT Comments    Pt making slow progress towards physical therapy goals. Pt struggling with stair training at beginning of session, stating its too soon for her to be discharged. Discussed safety at home and recommended HHPT to follow-up at d/c, and pt agreeable to try stair training again. Second attempt pt able to negotiate 3 steps ascending/descending utilizing backwards technique with RW. Afterwards, pt agreeable to home today.   Follow Up Recommendations  Home health PT;Supervision/Assistance - 24 hour     Equipment Recommendations  Rolling walker with 5" wheels;3in1 (PT)    Recommendations for Other Services       Precautions / Restrictions Precautions Precautions: Back;Fall Precaution Comments: Educated pt on 3/3 back precautions and incorporating into ADLs.  Required Braces or Orthoses: Spinal Brace Spinal Brace: Applied in sitting position;Lumbar corset Restrictions Weight Bearing Restrictions: No    Mobility  Bed Mobility Overal bed mobility: Needs Assistance Bed Mobility: Supine to Sit     Supine to sit: Min guard     General bed mobility comments: VC's for sequencing and technique for log roll and maintenance for   Transfers Overall transfer level: Needs assistance Equipment used: Rolling walker (2 wheeled) Transfers: Sit to/from [facility] Sit to Stand: Min assist Stand pivot transfers: Min guard       General transfer comment: Assist to power-up to full standing position. Increased time required due to pain. Pt sit<>stand x5 during session.   Ambulation/Gait Ambulation/Gait assistance: Min guard Ambulation Distance (Feet): 30 Feet Assistive device: Rolling walker (2 wheeled) Gait Pattern/deviations: Step-through pattern;Decreased stride length;Trunk flexed Gait  velocity: Decreased Gait velocity interpretation: Below normal speed for age/gender General Gait Details: VC's for improved posture and to maintain back precautions. Limited distance due to focus on stair training.    Stairs Stairs: Yes Stairs assistance: Min assist Stair Management: Backwards;With walker Number of Stairs: 3 General stair comments: Ascending and descending. VC's for sequencing and overall safety awareness. Steadying assist as pt very anxious and somewhat shaky while performing stair training.   Wheelchair Mobility    Modified Rankin (Stroke Patients Only)       Balance Overall balance assessment: Needs assistance Sitting-balance support: Feet supported;No upper extremity supported Sitting balance-Leahy Scale: Fair     Standing balance support: Bilateral upper extremity supported Standing balance-Leahy Scale: Poor                      Cognition Arousal/Alertness: Awake/alert Behavior During Therapy: WFL for tasks assessed/performed;Anxious Overall Cognitive Status: Within Functional Limits for tasks assessed                      Exercises      General Comments        Pertinent Vitals/Pain Vitals stable during session. BP somewhat low, and RN states this is her baseline.     Home Living                      Prior Function            PT Goals (current goals can now be found in the care plan section) Acute Rehab PT Goals Patient Stated Goal: to be safe to go home PT Goal Formulation: With patient Time For Goal Achievement: 05/09/14 Potential to Achieve Goals:  Good Progress towards PT goals: Progressing toward goals    Frequency  Min 5X/week    PT Plan Current plan remains appropriate    Co-evaluation             End of Session Equipment Utilized During Treatment: Gait belt;Back brace Activity Tolerance: Patient limited by fatigue;Patient limited by pain Patient left: in chair;with call bell/phone within  reach     Time: 1417-1520 PT Time Calculation (min): 63 min  Charges:  $Gait Training: 23-37 mins $Therapeutic Activity: 23-37 mins                    G Codes:      Jolyn Lent May 25, 2014, 3:45 PM  Jolyn Lent, PT, DPT Acute Rehabilitation Services Pager: [phone]

## 2014-05-07 NOTE — Progress Notes (Signed)
Picked up patient at 23:00 last night. Patient was complaining about pain,patient maybe discharged today. She is stating she don't think she is ready to go home today. Patient states that she needs another type of pain pill. Will report off to day shift nurse and continue to monitor.

## 2014-05-07 NOTE — Care Management Note (Signed)
    Page 1 of 1   05/07/2014     4:27:51 PM CARE MANAGEMENT NOTE 05/07/2014  Patient:  JLEIGH, STRIPLIN   Account Number:  [id number]  Date Initiated:  05/07/2014  Documentation initiated by:  [facility]  Subjective/Objective Assessment:   adm: L3-4 and L4-5 spinal stenosis, lumbago, lumbar radiculopathy, spondylolisthesis     Action/Plan:   discharge planning   Anticipated DC Date:  05/07/2014   Anticipated DC Plan:  [place]  CM consult      [facility] Choice  HOME HEALTH   Choice offered to / List presented to:  C-1 Patient        [place] arranged  HH-2 PT      [place]   Status of service:  Completed, signed off Medicare Important Message given?  YES (If response is "NO", the following Medicare IM given date fields will be blank) Date Medicare IM given:   Date Additional Medicare IM given:  05/07/2014  Discharge Disposition:  [place]  Per UR Regulation:    If discussed at Long Length of Stay Meetings, dates discussed:    Comments:  05/07/14 16:15 CM met with pt in room to offer choice for home health agency.  Pt chooses [facility] to render HHPT.  CM called Rolan Bucco with referral however, [facility] cannot accomodate because of staffing issues.  CM explained to pt who chooses [facility] for HHPT.  Address and contact information verified with pt. Referral for HHPT texted [facility] rep, Kristen.  Order request made and F2F request made.  No other CM needs were communicated. Signed IM from Medicare placed on shadow chart and copy given to pt.   Mariane Masters, [phone].

## 2014-05-07 NOTE — Discharge Summary (Signed)
Physician Discharge Summary  Patient ID: Lisa Becker MRN: [id number] DOB/AGE: 1934-01-03 80 y.o.  Admit date: 05/05/2014 Discharge date: 05/07/2014  Admission Diagnoses: L3-4 and L4-5 spinal stenosis, lumbago, lumbar radiculopathy, spondylolisthesis  Discharge Diagnoses: The same Active Problems:   Spondylolisthesis of lumbar region   Discharged Condition: good  Hospital Course: Dr. Hal Neer performed at L3-4 and L4-5 decompression, instrumentation, and fusion on the patient on 05/05/2014.  The patient's postoperative course was unremarkable. On postoperative day #2 she requested discharge to home. She was given oral and written discharge instructions. All her questions were answered. She requested something stronger than hydrocodone for pain.  Consults: None Significant Diagnostic Studies: None Treatments: L3-4 and L4-5 decompression, instrumentation, and fusion. Discharge Exam: Blood pressure 124/54, pulse 80, temperature 98.1 F (36.7 C), temperature source Oral, resp. rate 18, height 5' 1.81" (1.57 m), weight 98.9 kg (218 lb 0.6 oz), SpO2 98.00%. The patient is alert and pleasant. Her strength is grossly normal.  Disposition: Home  Discharge Instructions   Call MD for:  difficulty breathing, headache or visual disturbances    Complete by:  As directed      Call MD for:  extreme fatigue    Complete by:  As directed      Call MD for:  hives    Complete by:  As directed      Call MD for:  persistant dizziness or light-headedness    Complete by:  As directed      Call MD for:  persistant nausea and vomiting    Complete by:  As directed      Call MD for:  redness, tenderness, or signs of infection (pain, swelling, redness, odor or green/yellow discharge around incision site)    Complete by:  As directed      Call MD for:  severe uncontrolled pain    Complete by:  As directed      Call MD for:  temperature >100.4    Complete by:  As directed      Diet - low sodium heart  healthy    Complete by:  As directed      Discharge instructions    Complete by:  As directed   Call [phone] for a followup appointment. Take a stool softener while you are using pain medications.     Driving Restrictions    Complete by:  As directed   Do not drive for 2 weeks.     Increase activity slowly    Complete by:  As directed      Lifting restrictions    Complete by:  As directed   Do not lift more than 5 pounds. No excessive bending or twisting.     May shower / Bathe    Complete by:  As directed   He may shower after the pain she is removed 3 days after surgery. Leave the incision alone.     Remove dressing in 24 hours    Complete by:  As directed             Medication List    STOP taking these medications       HYDROcodone-acetaminophen 5-325 MG per tablet  Commonly known as:  NORCO/VICODIN      TAKE these medications       albuterol 108 (90 BASE) MCG/ACT inhaler  Commonly known as:  PROVENTIL HFA;VENTOLIN HFA  Inhale 2 puffs into the lungs every 6 (six) hours as needed for wheezing or shortness of breath.  ascorbic acid 1000 MG tablet  Commonly known as:  VITAMIN C  Take 1,000 mg by mouth daily with breakfast.     aspirin 81 MG tablet  Take 81 mg by mouth every evening.     Calcium-Magnesium 500-250 MG Tabs  Take 1 tablet by mouth every evening.     citalopram 20 MG tablet  Commonly known as:  CELEXA  Take 20 mg by mouth daily with breakfast.     CO-Q 10 Omega-3 Fish Oil Caps  Take 1 capsule by mouth 2 (two) times daily.     DSS 100 MG Caps  Take 100 mg by mouth 2 (two) times daily.     fluticasone-salmeterol 115-21 MCG/ACT inhaler  Commonly known as:  ADVAIR HFA  Inhale 2 puffs into the lungs 2 (two) times daily.     gabapentin 100 MG capsule  Commonly known as:  NEURONTIN  Take 200 mg by mouth 3 (three) times daily.     LIPOIC ACID PO  Take 300 mg by mouth every evening.     OVER THE COUNTER MEDICATION  Place 1-2 drops into  both eyes 2 (two) times daily. [place] Eye Drops     oxyCODONE-acetaminophen 10-325 MG per tablet  Commonly known as:  PERCOCET  Take 1 tablet by mouth every 4 (four) hours as needed for pain.     potassium chloride 10 MEQ CR capsule  Commonly known as:  MICRO-K  Take 20 mEq by mouth daily with breakfast.     pramipexole 0.25 MG tablet  Commonly known as:  MIRAPEX  Take 0.25 mg by mouth at bedtime as needed and may repeat dose one time if needed (restless leg).     simvastatin 20 MG tablet  Commonly known as:  ZOCOR  Take 20 mg by mouth every evening.     torsemide 20 MG tablet  Commonly known as:  DEMADEX  Take 20 mg by mouth daily with breakfast.     vitamin B-12 1000 MCG tablet  Commonly known as:  CYANOCOBALAMIN  Take 1,000 mcg by mouth daily with breakfast.     Vitamin D3 2000 UNITS Tabs  Take 2,000 Units by mouth daily with breakfast.     VITRON-C PO  Take 1 tablet by mouth daily with breakfast.         Signed: JENKINS,JEFFREY D 05/07/2014, 9:02 AM

## 2014-05-07 NOTE — Progress Notes (Signed)
Patient is d/c home, discharge instruction given and back precautions reviewed with the patient. Patient verbalized understanding.

## 2014-05-08 ENCOUNTER — Inpatient Hospital Stay (HOSPITAL_COMMUNITY)
Admission: EM | Admit: 2014-05-08 | Discharge: 2014-05-12 | DRG: 948 | Disposition: A | Discharge status: Skilled Nursing Facility | Payer: Medicare Other | Attending: Neurosurgery | Admitting: Neurosurgery

## 2014-05-08 ENCOUNTER — Encounter (HOSPITAL_COMMUNITY): Payer: Self-pay | Admitting: Emergency Medicine

## 2014-05-08 DIAGNOSIS — G8918 Other acute postprocedural pain: Principal | ICD-10-CM | POA: Diagnosis present

## 2014-05-08 DIAGNOSIS — M545 Low back pain, unspecified: Secondary | ICD-10-CM | POA: Diagnosis present

## 2014-05-08 DIAGNOSIS — Z981 Arthrodesis status: Secondary | ICD-10-CM

## 2014-05-08 DIAGNOSIS — Z4789 Encounter for other orthopedic aftercare: Secondary | ICD-10-CM

## 2014-05-08 MED ORDER — ACETAMINOPHEN 650 MG RE SUPP: 650.0000 mg | RECTAL | Status: DC | PRN

## 2014-05-08 MED ORDER — LACTATED RINGERS IV SOLN
INTRAVENOUS | Status: DC
  Administered 2014-05-08: 14:00:00 via INTRAVENOUS

## 2014-05-08 MED ORDER — CEFAZOLIN SODIUM-DEXTROSE 2-3 GM-% IV SOLR
2.0000 g | Freq: Three times a day (TID) | INTRAVENOUS | Status: AC
  Administered 2014-05-08 (×2): 2 g via INTRAVENOUS
  Filled 2014-05-08 (×2): qty 50

## 2014-05-08 MED ORDER — DOCUSATE SODIUM 100 MG PO CAPS
100.0000 mg | ORAL_CAPSULE | Freq: Two times a day (BID) | ORAL | Status: DC
  Administered 2014-05-08 – 2014-05-12 (×9): 100 mg via ORAL
  Filled 2014-05-08 (×9): qty 1

## 2014-05-08 MED ORDER — MORPHINE SULFATE 2 MG/ML IJ SOLN
1.0000 mg | INTRAMUSCULAR | Status: DC | PRN
  Administered 2014-05-08 – 2014-05-10 (×7): 2 mg via INTRAVENOUS
  Filled 2014-05-08 (×8): qty 1

## 2014-05-08 MED ORDER — OXYCODONE-ACETAMINOPHEN 5-325 MG PO TABS
1.0000 | ORAL_TABLET | ORAL | Status: DC | PRN
  Administered 2014-05-08 – 2014-05-09 (×5): 2 via ORAL
  Administered 2014-05-09: 1 via ORAL
  Administered 2014-05-10 – 2014-05-12 (×10): 2 via ORAL
  Filled 2014-05-08 (×16): qty 2

## 2014-05-08 MED ORDER — MENTHOL 3 MG MT LOZG
1.0000 | LOZENGE | OROMUCOSAL | Status: DC | PRN
  Filled 2014-05-08: qty 9

## 2014-05-08 MED ORDER — PHENOL 1.4 % MT LIQD
1.0000 | OROMUCOSAL | Status: DC | PRN
  Filled 2014-05-08: qty 177

## 2014-05-08 MED ORDER — DIAZEPAM 5 MG PO TABS
5.0000 mg | ORAL_TABLET | Freq: Four times a day (QID) | ORAL | Status: DC | PRN
  Administered 2014-05-08 – 2014-05-12 (×10): 5 mg via ORAL
  Filled 2014-05-08 (×10): qty 1

## 2014-05-08 MED ORDER — ONDANSETRON HCL 4 MG/2ML IJ SOLN: 4.0000 mg | INTRAMUSCULAR | Status: DC | PRN

## 2014-05-08 MED ORDER — HYDROCODONE-ACETAMINOPHEN 5-325 MG PO TABS
1.0000 | ORAL_TABLET | ORAL | Status: DC | PRN
  Administered 2014-05-08: 2 via ORAL
  Filled 2014-05-08: qty 2

## 2014-05-08 MED ORDER — POLYETHYLENE GLYCOL 3350 17 G PO PACK
17.0000 g | PACK | Freq: Every day | ORAL | Status: DC
  Administered 2014-05-08 – 2014-05-12 (×5): 17 g via ORAL
  Filled 2014-05-08 (×5): qty 1

## 2014-05-08 MED ORDER — ALUM & MAG HYDROXIDE-SIMETH 200-200-20 MG/5ML PO SUSP: 30.0000 mL | Freq: Four times a day (QID) | ORAL | Status: DC | PRN

## 2014-05-08 MED ORDER — ACETAMINOPHEN 325 MG PO TABS
650.0000 mg | ORAL_TABLET | ORAL | Status: DC | PRN
Start: 2014-05-08 — End: 2014-05-12

## 2014-05-08 NOTE — Progress Notes (Addendum)
Weekend CSW received telephone call from daughter [name] with information on SNF policy- [employer] # [id number]. CSW will fax referral to [place] SNF's. Family prefers [facility] or [facility].  Tilden Fossa, MSW, [place] Clinical Social Worker [facility] Emergency Dept. [phone]

## 2014-05-08 NOTE — ED Notes (Signed)
Case management at bedside; she states to hold on admission to see if she can direct admit to the SNF.

## 2014-05-08 NOTE — H&P (Signed)
This is an interim history and physical on Lisa Becker. Lisa Becker is a patient of Dr. Sande Rives. He performed an L3-4 and L4-5 decompression, instrumentation, and fusion on the patient on 05/05/2014. The patient requested discharge to home yesterday and was discharged.  The patient call me 2 times this morning said that she could not get around her house very well because of the back pain. She did not think she can manage at home and she was instructed to come to the emergency room. The patient says she wants to go to a skilled nursing facility in [place] and she does not think she can manage at home.  Physical exam:  General: A pleasant 80 year old white female complains of back pain.  Neurologic exam: The patient is alert and oriented x3. Her strength is grossly normal in her bilateral quadriceps, gastrocnemius, dorsiflexors. Her incision is healing well without signs of infection.  Assessment and plan: Intractable back pain. The patient is to be admitted for further physical therapy and IV pain medications. We will need to start working on skilled nurse facility placement in [place].

## 2014-05-08 NOTE — Progress Notes (Addendum)
Clinical Social Work Department BRIEF PSYCHOSOCIAL ASSESSMENT 05/08/2014  Patient:  Lisa Becker, Lisa Becker     Account Number:  [id number]     Admit date:  05/08/2014  Clinical Social Worker:  Su Monks  Date/Time:  05/08/2014 02:35 PM  Referred by:  Care Management  Date Referred:  05/08/2014 Referred for  SNF Placement   Other Referral:   Interview type:  Patient Other interview type:   Weekend CSW also spoke with patient's daughters Lorenda Ishihara [phone] and Tye [place]  [phone] who were present at the bedside.    PSYCHOSOCIAL DATA Living Status:  SIGNIFICANT OTHER Admitted from facility:   Level of care:   Primary support name:  Quinnie Barcelo [phone] Primary support relationship to patient:  SPOUSE Degree of support available:   Fair- according to family, husband is elderly and not able to offer high level of physical assistance, which she needs following recent surgery.    CURRENT CONCERNS Current Concerns  Post-Acute Placement   Other Concerns:    SOCIAL WORK ASSESSMENT / PLAN Weekend ED CSW informed that patient in ED after being discharged from inpatient stay on 05/07/14. Patient was admitted to floor before CSW could assess in ED. ED CSW and CM met with patient in room to assess. Patient stated that she is not able to manage at home and requires SNF rehab at this time. Patient was discharged from hospital following surgery with home health scheduled to start. Patient reports that she lives at home with her elderly husband who is not able to help lift her. Patient states that she is in a high level of pain and not able to sit or stand by herself at this time. Patient's two daughters were present at the bedside. They report that they are not able to provide 24 hour supervision and assistance as they both work full-time. CM and CSW informed patient that she does not meet 3-night qualifying Medicare stay for SNF and that SNF would be private pay. Patient and family shared  their concerns regarding safety of patient if she were to return home. CSW and CM offered support. Patient states that she has a Cytogeneticist. Daughter states that she will get the information and fax to [place] today 05/08/14. CSW will initiate SNF search in patient's county of preference.   Assessment/plan status:  Information/Referral to [employer] Other assessment/ plan:   Information/referral to community resources:   Weekend CSW will initiate SNF search in patient's county of preference.    PATIENT'S/FAMILY'S RESPONSE TO PLAN OF CARE: Patient and family requesting SNF at this time as they feel that patient is not able to manage at home at this time.     Tilden Fossa, MSW, [place] Clinical Social Worker [facility] Emergency Dept. [phone]

## 2014-05-08 NOTE — ED Notes (Signed)
Case Management called and said to go ahead with admission. Notified Manus Gunning, RN on 4N patient transferring up to her at this time.

## 2014-05-08 NOTE — Progress Notes (Addendum)
Clinical Social Work Department CLINICAL SOCIAL WORK PLACEMENT NOTE 05/08/2014  Patient:  Lisa Becker, Lisa Becker  Account Number:  [id number] Admit date:  05/08/2014  Clinical Social Worker:  Tilden Fossa, Latanya Presser  Date/time:  05/08/2014 04:46 PM  Clinical Social Work is seeking post-discharge placement for this patient at the following level of care:   SKILLED NURSING   (*CSW will update this form in Epic as items are completed)     Patient/family provided with [place] Department of Clinical Social Work's list of facilities offering this level of care within the geographic area requested by the patient (or if unable, by the patient's family).  05/08/2014  Patient/family informed of their freedom to choose among providers that offer the needed level of care, that participate in Medicare, Medicaid or managed care program needed by the patient, have an available bed and are willing to accept the patient.    Patient/family informed of [facility]' ownership interest in [facility], as well as of the fact that they are under no obligation to receive care at this facility.  PASARR submitted to EDS on 05/05/2012 PASARR number received on 05/05/2012  FL2 transmitted to all facilities in geographic area requested by pt/family on  05/08/2014 FL2 transmitted to all facilities within larger geographic area on   Patient informed that his/her managed care company has contracts with or will negotiate with  certain facilities, including the following:     Patient/family informed of bed offers received:  05/11/14 Patient chooses bed at [facility] recommends and patient chooses bed at    Patient to be transferred to [place] [facility] [facility] on  05/12/14 Patient to be transferred to facility by [facility]. Patient and family notified of transfer on 05/12/14 Name of family member notified:  Santiago Bumpers  The following physician request were entered in  Epic:   Additional Comments:  Tilden Fossa, MSW, [place] Emergency Dept. [phone]

## 2014-05-08 NOTE — ED Notes (Signed)
Patient on her side in pain which made bp low will try again

## 2014-05-08 NOTE — ED Notes (Signed)
Attempted report 

## 2014-05-08 NOTE — Progress Notes (Addendum)
  CARE MANAGEMENT ED NOTE 05/08/2014  Patient:  Lisa Becker, Lisa Becker   Account Number:  [id number]  Date Initiated:  05/08/2014  Documentation initiated by:  [facility]  Subjective/Objective Assessment:     Subjective/Objective Assessment Detail:   Pt presented to [facility] ED c/o intractable post op pain. Pt had an L3-4 and L4-5 decompression, instrumentation, and fusion on 6/25 and was discharged home yesterday with [facility] services. Pt is now requesting SNF placement.     Action/Plan:   Action/Plan Detail:   Anticipated DC Date:       Status Recommendation to Physician:   Result of Recommendation:        Choice offered to / List presented to:            Status of service:  Completed, signed off  ED Comments:   ED Comments Detail:  ED CM noted patient for readmission s/p L3-4 and L4-5 decompression, instrumentation, and fusion on 6/25 and was discharged home yesterday with [facility].  ED CM meet with patient at bedside concerning recent discharge and SNF request. Initially spoke with patient regarding SNF placement from ED today, since patient was discharge from an inpatient stay less than 30 days. Patient states she would be agreeable. ED RN Lisa Becker asked to hold transferring patient to floor. Contacted CSW in regards to possible plan for. Contacted Dr. Arnoldo Becker about woriking on the possibility of placement from ED, he declines  he prefers patient to stay for pain control.   After closely reviewing days patient did not meet the 3 day qualifying stay for Medicare. Notified Dr. Reynaldo Becker, Pt not meeting inpatient criteria and does not meet Medicare criteria for 3 day qualifying  SNF placement. ED CM and ED CSW spoke with patient and Daughters Lisa Becker [phone] concerning Lisa Becker placement being a out of pocket charge not covered by Medicare. Pt and family stated that they were unaware but, verbalized understanding. Pt reports having an additional policy for Long term Placement that she believes can help  with SNF payment. Pt instructed daughter Lisa Becker to bring policy from home for review. Provided patient and family with 4N CSW/CM  name and contact information to follow up with in the morning. Patient and family appreciative and agreeable. Patient also made aware of current Obs status, and explained and  Provided patient and family with a copy of CMS pamphlet "Are You a Hospital Inpatient or Outpatient?". Pt and family verbalized appreciation for explanation.

## 2014-05-08 NOTE — ED Notes (Signed)
Dr. Arnoldo Morale from surgery at bedside discussing plan.

## 2014-05-08 NOTE — ED Notes (Signed)
Patient denies wanting any pain medication at this time; states she took it at home before she came at 98 and wants to wait 4 hours.

## 2014-05-08 NOTE — ED Notes (Signed)
Back surgery on 6/25, discharged home yesterday. States she wanted to do inpatient rehab, but they sent her home instead. States she has increased immobility and increased pain. Took oxycodone at 0950. Wants to be admitted to rehab, says she shouldn't be at home.

## 2014-05-09 MED ORDER — PRAMIPEXOLE DIHYDROCHLORIDE 0.125 MG PO TABS
0.2500 mg | ORAL_TABLET | Freq: Every evening | ORAL | Status: DC | PRN
  Administered 2014-05-10 (×2): 0.25 mg via ORAL
  Filled 2014-05-09 (×2): qty 2

## 2014-05-09 NOTE — Care Management Note (Unsigned)
    Page 1 of 2   05/10/2014     9:52:04 AM CARE MANAGEMENT NOTE 05/10/2014  Patient:  Lisa Becker, Lisa Becker   Account Number:  [id number]  Date Initiated:  05/09/2014  Documentation initiated by:  Lorne Skeens  Subjective/Objective Assessment:   Patient was admitted with post-operative lumbar pain. lives at home with husband, who is not able to assist with care.     Action/Plan:   will follow for discharge needs pending PT/OT evals and physician orders.   Anticipated DC Date:     Anticipated DC Plan:  [place] referral  Clinical Social Worker      DC Planning Services  CM consult      Choice offered to / List presented to:             Status of service:   Medicare Important Message given?   (If response is "NO", the following Medicare IM given date fields will be blank) Date Medicare IM given:   Medicare IM given by:   Date Additional Medicare IM given:   Additional Medicare IM given by:    Discharge Disposition:    Per UR Regulation:  Reviewed for med. necessity/level of care/duration of stay  If discussed at [place] of Stay Meetings, dates discussed:    Comments:  05/10/14 [place] RN, MSN, CM- Recieved a call from patient's daughter again requesting an order for a lift chair. CM informed daughter that Dr Hal Neer did not feel there was a medical necessity at this time, so no order will be written. Daughter verbalized understanding.   05/09/14 [place], MSN, CM- Per medical advisor, patient now meets for inpatient status.  Order was changed and CSW was notified of plans to pursue SNF placement.  Patient and daughter Lisa Becker were updated.    05/09/14 [place], MSN, CM- Spoke with patient's daughter Lisa Becker regarding discharge disposition. Daughter is aware that under observation status, SNF placement would be private pay.  Case is currently being reviewed on a daily basis regarding inpatient vs  observation status.  CM discussed plans to speak with Dr Hal Neer regarding home health at discharge.  Daughter is requesting a lift chair and [facility] home health services. A private duty care agency list was faxed to patient's daughter for review.  CM discussed the circumstances with Dr Hal Neer at length.  If deemed appropriate in review by medical advisor, Dr Hal Neer would like patient changed to inpatient status as she is requiring IV pain medications and is receiving IV fluids at this time.  CM will keep Dr Hal Neer and patient's daughter updated on status.

## 2014-05-09 NOTE — Progress Notes (Signed)
UR Completed.  Brown, Sarah Jane [phone] 05/09/2014  

## 2014-05-09 NOTE — Progress Notes (Signed)
Dr Hal Neer: Per medical advisor, patient now meets for inpatient status.  Order was changed and CSW was notified of plans to pursue SNF placement.  Patient's daughter [name] was updated.

## 2014-05-09 NOTE — Evaluation (Signed)
Physical Therapy Evaluation Patient Details Name: Lisa Becker MRN: [id number] DOB: 1933/12/21 Today's Date: 05/09/2014   History of Present Illness  Pt is a 80 yo female who underwent a PLIF L3-L5 on 6/25, was d/c'd home 6/27 and then re-admitted 6/28 from home due to pain and inability to move.  Clinical Impression  Pt admitted with above. Pt unsafe to d/c home due to minimal assist available. Pt mobility mostly limited by pain. Pt unable to dress/bath herself or fix self meals and spouse unable to physically assist pt. Pt to benefit from ST-SNF placement to achieve safe mod I function for safe transition home.    Follow Up Recommendations SNF    Equipment Recommendations  None recommended by PT    Recommendations for Other Services OT consult     Precautions / Restrictions Precautions Precautions: Back;Fall Precaution Booklet Issued: Yes (comment) Precaution Comments: pt able to recall and follow precautions Required Braces or Orthoses: Spinal Brace Spinal Brace: Applied in sitting position;Lumbar corset Restrictions Weight Bearing Restrictions: No      Mobility  Bed Mobility Overal bed mobility: Modified Independent Bed Mobility: Sidelying to Sit   Sidelying to sit: Supervision       General bed mobility comments: pt received in sidelying, pt required use of bed rail, significant increase in time due to pain  Transfers Overall transfer level: Needs assistance Equipment used: Rolling walker (2 wheeled) Transfers: Sit to/from Stand Sit to Stand: Min assist         General transfer comment: increased time to power-up to full standing, minA to provide support during hand transition from bed to walker, increased pain  Ambulation/Gait Ambulation/Gait assistance: Min guard Ambulation Distance (Feet): 50 Feet Assistive device: Rolling walker (2 wheeled) Gait Pattern/deviations: Step-through pattern;Decreased stride length;Wide base of support Gait velocity:  slow Gait velocity interpretation: <1.8 ft/sec, indicative of risk for recurrent falls General Gait Details: labored effort, + groans of pain, easily fatigued  Stairs            Wheelchair Mobility    Modified Rankin (Stroke Patients Only)       Balance Overall balance assessment: Needs assistance     Sitting balance - Comments: pt with onset of back pain limiting sitting tolerance   Standing balance support: Bilateral upper extremity supported Standing balance-Leahy Scale: Poor Standing balance comment: requires use of RW                             Pertinent Vitals/Pain Surgical incision pain, pt did not rate    Home Living Family/patient expects to be discharged to:: Skilled nursing facility                 Additional Comments: pt was d/c home and then was unable to get OOB due to pain, 81 yo spouse is at home but unable to physically assist pt    Prior Function Level of Independence: Needs assistance   Gait / Transfers Assistance Needed: was walking in hospital with PT with RW however unable to at home  ADL's / Homemaking Assistance Needed: pt was dep at home due to pain since surgery        Hand Dominance   Dominant Hand: Right    Extremity/Trunk Assessment   Upper Extremity Assessment: Generalized weakness           Lower Extremity Assessment: Generalized weakness (R LE with sciatica pain per pt report)  Cervical / Trunk Assessment: Normal  Communication   Communication: No difficulties  Cognition Arousal/Alertness: Awake/alert Behavior During Therapy: WFL for tasks assessed/performed;Anxious Overall Cognitive Status: Within Functional Limits for tasks assessed                      General Comments General comments (skin integrity, edema, etc.): pt upset regarding being d/c'd when she "knew" she shouldn't go home    Exercises        Assessment/Plan    PT Assessment Patient needs continued PT  services  PT Diagnosis Difficulty walking;Acute pain;Generalized weakness   PT Problem List Decreased strength;Decreased activity tolerance;Decreased balance;Decreased mobility;Decreased knowledge of precautions;Decreased knowledge of use of DME;Pain  PT Treatment Interventions DME instruction;Gait training;Stair training;Therapeutic activities;Therapeutic exercise;Functional mobility training;Balance training;Neuromuscular re-education;Patient/family education   PT Goals (Current goals can be found in the Care Plan section) Acute Rehab PT Goals PT Goal Formulation: With patient Time For Goal Achievement: 05/16/14 Potential to Achieve Goals: Good    Frequency Min 5X/week   Barriers to discharge Decreased caregiver support husband unable to provide physical assist, dtr works full time job    Co-evaluation               End of Session Equipment Utilized During Treatment: Gait belt;Back brace Activity Tolerance: Patient limited by fatigue;Patient limited by pain Patient left: in chair;with call bell/phone within reach Scientist, forensic present) Nurse Communication: Mobility status;Precautions    Functional Assessment Tool Used: clinical judgement Functional Limitation: Mobility: Walking and moving around Mobility: Walking and Moving Around Current Status [phone]): At least 40 percent but less than 60 percent impaired, limited or restricted Mobility: Walking and Moving Around Goal Status [phone]): At least 1 percent but less than 20 percent impaired, limited or restricted    Time: 0912-0941 PT Time Calculation (min): 29 min   Charges:   PT Evaluation $Initial PT Evaluation Tier I: 1 Procedure PT Treatments $Gait Training: 8-22 mins   PT G Codes:   Functional Assessment Tool Used: clinical judgement Functional Limitation: Mobility: Walking and moving around    [place], [facility] 05/09/2014, 9:55 AM  Kittie Plater, PT, DPT Pager #: [phone] Office #: [phone]

## 2014-05-09 NOTE — Progress Notes (Signed)
Patient ID: Lisa Becker, female   DOB: March 02, 1934, 80 y.o.   MRN: [id number] Afeb, vss No new neuro issues Feeling somewhat better now. But still very slow to get around. She would probably benefit from SNF, but that may not be an option from insurance stand point.  Will wait to hear from case management

## 2014-05-10 MED ORDER — PRAMIPEXOLE DIHYDROCHLORIDE 0.125 MG PO TABS
0.2500 mg | ORAL_TABLET | Freq: Two times a day (BID) | ORAL | Status: DC | PRN
  Administered 2014-05-11 – 2014-05-12 (×2): 0.25 mg via ORAL
  Filled 2014-05-10 (×2): qty 2

## 2014-05-10 NOTE — Progress Notes (Signed)
Patient ID: Lisa Becker, female   DOB: 30-Oct-1934, 80 y.o.   MRN: [id number] Afeb, vss Still in enough pain that she is needing IV pain meds on occasion. Slowly increasing activity. Will hopefully be ready for a SNF placement in next few days. FL2 signed.

## 2014-05-11 ENCOUNTER — Ambulatory Visit: Payer: Self-pay | Admitting: Oncology

## 2014-05-11 ENCOUNTER — Encounter: Payer: Self-pay | Admitting: Internal Medicine

## 2014-05-11 NOTE — Progress Notes (Signed)
Patient ID: Lisa Becker, female   DOB: 01/13/1934, 80 y.o.   MRN: [id number] Afeb, vss No new neuro issues Staring to feel a bit better, and wants to try to do without IV pain meds. If she can do that, she should be ready to go to SNF tomorrow.

## 2014-05-11 NOTE — Progress Notes (Signed)
Social work Theme park manager to [employer], pt family want's to transport pt to SNF upon discharge tomorrow if pt is medically stable. MD called for clarification and MD "OK" for pt to transport to facility with family upon discharge. Social worker informed. Francis Gaines Talena Neira RN.

## 2014-05-11 NOTE — Progress Notes (Addendum)
Physical Therapy Treatment Patient Details Name: Lisa Becker MRN: [id number] DOB: 1933-12-24 Today's Date: 05/11/2014    History of Present Illness Pt is a 80 yo female who underwent a PLIF L3-L5 on 6/25, was d/c'd home 6/27 and then re-admitted 6/28 from home due to pain and inability to move.    PT Comments    Pt admitted with above. Pt currently with functional limitations due to balance and endurance deficits.  Pt will benefit from skilled PT to increase their independence and safety with mobility to allow discharge to the venue listed below.   Follow Up Recommendations  SNF     Equipment Recommendations  None recommended by PT    Recommendations for Other Services OT consult     Precautions / Restrictions Precautions Precautions: Back;Fall Precaution Booklet Issued: Yes (comment) Precaution Comments: pt able to recall and follow precautions Required Braces or Orthoses: Spinal Brace Spinal Brace: Applied in sitting position;Lumbar corset Restrictions Weight Bearing Restrictions: No    Mobility  Bed Mobility                  Transfers Overall transfer level: Needs assistance Equipment used: Rolling walker (2 wheeled) Transfers: Sit to/from Stand Sit to Stand: Mod assist         General transfer comment: increased time to power-up to full standing, mod A to provide support during hand transition from low recliner to walker, increased pain  Ambulation/Gait Ambulation/Gait assistance: Min assist Ambulation Distance (Feet): 100 Feet Assistive device: Rolling walker (2 wheeled) Gait Pattern/deviations: Step-through pattern;Decreased stride length;Wide base of support Gait velocity: slow Gait velocity interpretation: <1.8 ft/sec, indicative of risk for recurrent falls General Gait Details: min assist to steady with gait at times.  pt limited due to pain and fatigues and leans more as she fatigues.  No significant LOB.  Min cues for back precautions.   Pt  ambulated to bathroom and needed assist to wipe self.     Stairs            Wheelchair Mobility    Modified Rankin (Stroke Patients Only)       Balance Overall balance assessment: Needs assistance;History of Falls         Standing balance support: Bilateral upper extremity supported;During functional activity Standing balance-Leahy Scale: Poor Standing balance comment: Heavy reliance on RW for UE support                    Cognition Arousal/Alertness: Awake/alert Behavior During Therapy: WFL for tasks assessed/performed;Anxious Overall Cognitive Status: Within Functional Limits for tasks assessed                      Exercises      General Comments        Pertinent Vitals/Pain VSS, some back pain but not rated.    Home Living                      Prior Function            PT Goals (current goals can now be found in the care plan section) Progress towards PT goals: Progressing toward goals    Frequency  Min 5X/week    PT Plan Current plan remains appropriate    Co-evaluation             End of Session Equipment Utilized During Treatment: Gait belt;Back brace Activity Tolerance: Patient limited by fatigue;Patient limited by pain Patient left:  in chair;with call bell/phone within reach     Time: 1224-1255 PT Time Calculation (min): 31 min  Charges:  $Gait Training: 8-22 mins $Self Care/Home Management: 8-22                    G Codes:      Lisa Becker 05/22/14, 1:48 PM [facility] [phone] (pager)

## 2014-05-11 NOTE — Progress Notes (Signed)
CSW provided bed offers to patient. Patient unhappy with her choices and states she is going to have to call her daughter to see if she should expand her search outside of [facility]. CSW continues to follow.  Jeanette Caprice, MSW, [place]

## 2014-05-11 NOTE — Progress Notes (Signed)
CSW received a call from [facility] who states they can take patient. CSW informed patient of this information and patient states that is wonderful and she would like to go to [facility] at discharge. CSW left voicemail for patient's daughter to inform.  Jeanette Caprice, MSW, [place]

## 2014-05-12 NOTE — Progress Notes (Addendum)
CSW confirmed with Seth Bake with [facility] that pt does have a bed offer for today. CSW also spoke with pt daughter Lanora Manis who confirmed that she will provided transportation for pt after 14:00 today. DC packet placed in shadow chart.  RN informed to give report to [phone], for pt going to room 223 and to provide daughter with gold packet.   No additional needs, CSW signing off.  *CSW informed that [facility] is able to offer placement for pt. CSW notified pt daughter however daughter states she is pleased with [facility] and would like to proceed with dc plans as arranged.  Hunt Oris, MSW, [place]

## 2014-05-12 NOTE — Discharge Instructions (Signed)
Activity Instructions   You must avoid lifting more than *** pounds until your physician instructs you differently. You should avoid {d/c avoid/resume:120111}. You may resume {d/c avoid/resume:120111}.   Personal Items   Please collect all clothing which belongs to you from your nurse. Please collect any valuables you stored during your stay from the front desk, and please remember all of your personal items, such as dentures, canes, and eyeglasses.   Patient Discharge   Lisa Becker / [id number] DOB: May 10, 1934   Admitted 05/08/2014 Discharged: 05/12/2014

## 2014-05-12 NOTE — Discharge Summary (Signed)
Physician Discharge Summary  Patient ID: Lisa Becker MRN: [id number] DOB/AGE: [birth year] 80 y.o.  Admit date: 05/08/2014 Discharge date: 05/12/2014  Admission Diagnoses:  Discharge Diagnoses:  Active Problems:   Lumbago   Discharged Condition: good  Hospital Course: Surgery 1 week ago.Released after a few days, but could not manage at home, and re admitted 1 day later. Has slowly gotten better, but still needing assistance, and felt a snf would be beneficial. Now ready for release and set to go to [facility] . Specific instruxctions given.  Consults: None  Significant Diagnostic Studies: none   Treatments: Pain meds for control  Discharge Exam: Blood pressure 118/54, pulse 77, temperature 98.4 F (36.9 C), temperature source Oral, resp. rate 18, height 5\' 2"  (1.575 m), weight 98.884 kg (218 lb), SpO2 100.00%. Incision/Wound:clean and dry; no new neuro issues  Disposition: 06-Home-Health Care Svc     Medication List    ASK your doctor about these medications       albuterol 108 (90 BASE) MCG/ACT inhaler  Commonly known as:  PROVENTIL HFA;VENTOLIN HFA  Inhale 2 puffs into the lungs every 6 (six) hours as needed for wheezing or shortness of breath.     ascorbic acid 1000 MG tablet  Commonly known as:  VITAMIN C  Take 1,000 mg by mouth daily with breakfast.     aspirin 81 MG tablet  Take 81 mg by mouth every evening.     Calcium-Magnesium 500-250 MG Tabs  Take 1 tablet by mouth every evening.     citalopram 20 MG tablet  Commonly known as:  CELEXA  Take 20 mg by mouth daily with breakfast.     CO-Q 10 Omega-3 Fish Oil Caps  Take 1 capsule by mouth 2 (two) times daily.     DSS 100 MG Caps  Take 100 mg by mouth 2 (two) times daily.     fluticasone-salmeterol 115-21 MCG/ACT inhaler  Commonly known as:  ADVAIR HFA  Inhale 2 puffs into the lungs 2 (two) times daily.     gabapentin 100 MG capsule  Commonly known as:  NEURONTIN  Take 200 mg by mouth 3  (three) times daily.     LIPOIC ACID PO  Take 300 mg by mouth every evening.     OVER THE COUNTER MEDICATION  Place 1-2 drops into both eyes 2 (two) times daily. [place] Eye Drops     oxyCODONE-acetaminophen 10-325 MG per tablet  Commonly known as:  PERCOCET  Take 1 tablet by mouth every 4 (four) hours as needed for pain.     potassium chloride 10 MEQ CR capsule  Commonly known as:  MICRO-K  Take 20 mEq by mouth daily with breakfast.     pramipexole 0.25 MG tablet  Commonly known as:  MIRAPEX  Take 0.25 mg by mouth at bedtime as needed and may repeat dose one time if needed (restless leg).     simvastatin 20 MG tablet  Commonly known as:  ZOCOR  Take 20 mg by mouth every evening.     torsemide 20 MG tablet  Commonly known as:  DEMADEX  Take 20 mg by mouth daily with breakfast.     vitamin B-12 1000 MCG tablet  Commonly known as:  CYANOCOBALAMIN  Take 1,000 mcg by mouth daily with breakfast.     Vitamin D3 2000 UNITS Tabs  Take 2,000 Units by mouth daily with breakfast.     VITRON-C PO  Take 1 tablet by mouth daily with breakfast.  At home rest most of the time. Get up 9 or 10 times each day and take a 15 or 20 minute walk. No riding in the car and to your first postoperative appointment. If you have neck surgery you may shower from the chest down starting on the third postoperative day. If you had back surgery he may start showering on the third postoperative day with saran wrap wrapped around your incisional area 3 times. After the shower remove the saran wrap. Take pain medicine as needed and other medications as instructed. Call my office for an appointment.  SignedFaythe Ghee, MD 05/12/2014, 9:40 AM

## 2014-05-12 NOTE — Progress Notes (Signed)
Pt A&O x4; pt discharge education and instructions completed with pt and daughters at bedside. Report called off to [place] at [facility] at 1435. Pt discharge to SNF with daughters to transport her off to disposition. All lines including IV removed; pt back incision clean, dry, and intact with sutures open to air. Francis Gaines Aliviya Schoeller RN.

## 2014-05-17 DIAGNOSIS — F329 Major depressive disorder, single episode, unspecified: Secondary | ICD-10-CM

## 2014-05-17 DIAGNOSIS — E785 Hyperlipidemia, unspecified: Secondary | ICD-10-CM

## 2014-05-17 DIAGNOSIS — F3289 Other specified depressive episodes: Secondary | ICD-10-CM

## 2014-05-17 DIAGNOSIS — M48061 Spinal stenosis, lumbar region without neurogenic claudication: Secondary | ICD-10-CM

## 2014-05-17 DIAGNOSIS — G2581 Restless legs syndrome: Secondary | ICD-10-CM

## 2014-05-17 DIAGNOSIS — I10 Essential (primary) hypertension: Secondary | ICD-10-CM

## 2014-05-17 DIAGNOSIS — I270 Primary pulmonary hypertension: Secondary | ICD-10-CM

## 2014-05-24 DIAGNOSIS — R609 Edema, unspecified: Secondary | ICD-10-CM

## 2014-06-09 DIAGNOSIS — M519 Unspecified thoracic, thoracolumbar and lumbosacral intervertebral disc disorder: Secondary | ICD-10-CM | POA: Insufficient documentation

## 2014-06-09 DIAGNOSIS — E538 Deficiency of other specified B group vitamins: Secondary | ICD-10-CM | POA: Insufficient documentation

## 2014-06-09 DIAGNOSIS — G5793 Unspecified mononeuropathy of bilateral lower limbs: Secondary | ICD-10-CM | POA: Insufficient documentation

## 2014-06-11 ENCOUNTER — Ambulatory Visit: Payer: Self-pay | Admitting: Oncology

## 2014-07-26 ENCOUNTER — Ambulatory Visit: Payer: Self-pay | Admitting: Cardiology

## 2014-08-24 ENCOUNTER — Ambulatory Visit: Payer: Self-pay | Admitting: Oncology

## 2014-08-24 LAB — CBC CANCER CENTER
BASOS ABS: 0 x10 3/mm (ref 0.0–0.1)
Basophil %: 0.3 %
EOS ABS: 0.3 x10 3/mm (ref 0.0–0.7)
Eosinophil %: 3.4 %
HCT: 37.6 % (ref 35.0–47.0)
HGB: 11.7 g/dL — ABNORMAL LOW (ref 12.0–16.0)
LYMPHS PCT: 29.3 %
Lymphocyte #: 2.2 x10 3/mm (ref 1.0–3.6)
MCH: 27.1 pg (ref 26.0–34.0)
MCHC: 31.2 g/dL — ABNORMAL LOW (ref 32.0–36.0)
MCV: 87 fL (ref 80–100)
MONO ABS: 0.5 x10 3/mm (ref 0.2–0.9)
MONOS PCT: 6.8 %
Neutrophil #: 4.6 x10 3/mm (ref 1.4–6.5)
Neutrophil %: 60.2 %
PLATELETS: 216 x10 3/mm (ref 150–440)
RBC: 4.32 10*6/uL (ref 3.80–5.20)
RDW: 16.3 % — ABNORMAL HIGH (ref 11.5–14.5)
WBC: 7.6 x10 3/mm (ref 3.6–11.0)

## 2014-08-24 LAB — COMPREHENSIVE METABOLIC PANEL
ALK PHOS: 76 U/L
ALT: 27 U/L
Albumin: 3.6 g/dL (ref 3.4–5.0)
Anion Gap: 6 — ABNORMAL LOW (ref 7–16)
BILIRUBIN TOTAL: 0.3 mg/dL (ref 0.2–1.0)
BUN: 34 mg/dL — ABNORMAL HIGH (ref 7–18)
CHLORIDE: 100 mmol/L (ref 98–107)
Calcium, Total: 9.1 mg/dL (ref 8.5–10.1)
Co2: 34 mmol/L — ABNORMAL HIGH (ref 21–32)
Creatinine: 1.23 mg/dL (ref 0.60–1.30)
GFR CALC AF AMER: 54 — AB
GFR CALC NON AF AMER: 45 — AB
GLUCOSE: 98 mg/dL (ref 65–99)
OSMOLALITY: 287 (ref 275–301)
POTASSIUM: 3.9 mmol/L (ref 3.5–5.1)
SGOT(AST): 23 U/L (ref 15–37)
Sodium: 140 mmol/L (ref 136–145)
TOTAL PROTEIN: 7.7 g/dL (ref 6.4–8.2)

## 2014-08-25 LAB — CA 125: CA 125: 12 U/mL (ref 0.0–34.0)

## 2014-08-31 DIAGNOSIS — C44320 Squamous cell carcinoma of skin of unspecified parts of face: Secondary | ICD-10-CM | POA: Insufficient documentation

## 2014-09-14 ENCOUNTER — Ambulatory Visit: Payer: Self-pay | Admitting: Internal Medicine

## 2014-09-15 ENCOUNTER — Ambulatory Visit: Payer: Self-pay | Admitting: Oncology

## 2014-09-21 ENCOUNTER — Ambulatory Visit: Payer: Medicare Other | Admitting: Internal Medicine

## 2014-09-26 ENCOUNTER — Ambulatory Visit: Payer: Self-pay | Admitting: Oncology

## 2014-10-12 ENCOUNTER — Ambulatory Visit: Payer: Self-pay | Admitting: Oncology

## 2014-10-12 LAB — COMPREHENSIVE METABOLIC PANEL
ALK PHOS: 81 U/L
ANION GAP: 9 (ref 7–16)
Albumin: 3.4 g/dL (ref 3.4–5.0)
BUN: 33 mg/dL — AB (ref 7–18)
Bilirubin,Total: 0.4 mg/dL (ref 0.2–1.0)
CALCIUM: 8.6 mg/dL (ref 8.5–10.1)
CO2: 31 mmol/L (ref 21–32)
Chloride: 101 mmol/L (ref 98–107)
Creatinine: 1.23 mg/dL (ref 0.60–1.30)
EGFR (African American): 54 — ABNORMAL LOW
EGFR (Non-African Amer.): 45 — ABNORMAL LOW
Glucose: 110 mg/dL — ABNORMAL HIGH (ref 65–99)
Osmolality: 289 (ref 275–301)
POTASSIUM: 3.6 mmol/L (ref 3.5–5.1)
SGOT(AST): 22 U/L (ref 15–37)
SGPT (ALT): 23 U/L
Sodium: 141 mmol/L (ref 136–145)
TOTAL PROTEIN: 7.3 g/dL (ref 6.4–8.2)

## 2014-10-12 LAB — CBC CANCER CENTER
BASOS PCT: 0.4 %
Basophil #: 0 x10 3/mm (ref 0.0–0.1)
Eosinophil #: 0.3 x10 3/mm (ref 0.0–0.7)
Eosinophil %: 4.7 %
HCT: 35.9 % (ref 35.0–47.0)
HGB: 11.7 g/dL — AB (ref 12.0–16.0)
LYMPHS PCT: 17.5 %
Lymphocyte #: 1.3 x10 3/mm (ref 1.0–3.6)
MCH: 28.5 pg (ref 26.0–34.0)
MCHC: 32.6 g/dL (ref 32.0–36.0)
MCV: 88 fL (ref 80–100)
MONOS PCT: 6.9 %
Monocyte #: 0.5 x10 3/mm (ref 0.2–0.9)
NEUTROS ABS: 5.1 x10 3/mm (ref 1.4–6.5)
NEUTROS PCT: 70.5 %
Platelet: 211 x10 3/mm (ref 150–440)
RBC: 4.1 10*6/uL (ref 3.80–5.20)
RDW: 15.7 % — AB (ref 11.5–14.5)
WBC: 7.3 x10 3/mm (ref 3.6–11.0)

## 2014-10-13 LAB — CA 125: CA 125: 10.4 U/mL (ref 0.0–34.0)

## 2014-10-25 ENCOUNTER — Ambulatory Visit: Payer: Self-pay | Admitting: Ophthalmology

## 2014-11-11 ENCOUNTER — Ambulatory Visit: Payer: Self-pay | Admitting: Oncology

## 2014-11-25 DIAGNOSIS — Z8543 Personal history of malignant neoplasm of ovary: Secondary | ICD-10-CM | POA: Insufficient documentation

## 2014-12-12 ENCOUNTER — Ambulatory Visit: Payer: Self-pay | Admitting: Oncology

## 2015-02-28 NOTE — Op Note (Signed)
PATIENT NAME:  Lisa Becker, Lisa Becker MR#:  [id number] DATE OF BIRTH:  1934/05/22  DATE OF PROCEDURE:  10/15/2012  PREOPERATIVE DIAGNOSIS: Ovarian cancer with retroperitoneal metastasis.  POSTOPERATIVE DIAGNOSIS: Ovarian cancer with retroperitoneal metastasis.  PROCEDURE: Insertion of central venous catheter with subcutaneous infusion port.  SURGEON: Rochel Brome, MD  ANESTHESIA: Local 1% Xylocaine with monitored anesthesia care.   INDICATIONS: This 81 year old female recently had a malignant retroperitoneal mass removed which was metastatic ovarian cancer and chemotherapy is now needed for further treatment and central venous access needed.  DESCRIPTION OF PROCEDURE: The patient was placed on the operating table in the supine position. A rolled blanket was placed behind her shoulder blades. The neck was extended. The head was turned 20 degrees to the right. The anterior aspect of her neck and the subclavian areas were prepared with ChloraPrep and draped in a sterile manner. Next, the skin just below the clavicle was infiltrated with 1% Xylocaine. The ultrasound was put into a sterile sleeve and imaged the right jugular vein and carotid arteries. These vessels appeared normal. Next, a 3 cm transverse infraclavicular incision was made and carried down through subcutaneous tissues down to the deep fascia and made a subfascial pouch large enough to admit the PFM port. Subsequently, using ultrasound to further image the jugular vein, the skin overlying the jugular vein was infiltrated with 1% Xylocaine. A transversely oriented 6 mm incision was made. Next, with the patient in Trendelenburg position and using ultrasound guidance, a needle was inserted into the jugular vein and a guidewire was inserted which advanced down into the superior vena cava. It is noted that an ultrasound image was saved for the paper chart. Also, fluoroscopy was used to demonstrate the location of the guidewire. The dilator and  introducer sheath were advanced over the guidewire and the guidewire and dilator were removed. The catheter was inserted and the sheath peeled away. Next, fluoroscopy was used to position the tip of the catheter in the superior vena cava and a fluoroscopic image was saved for the paper chart. Next, the catheter was tunneled down to the subclavian port and was cut to fit and attached to the PFM catheter. This was accessed with a Charisse March needle. It would flush easily, but did not actually aspirate any blood. The port was placed into the subfascial pouch. It was secured with the accompanying sleeve. It was sutured to the muscle with 4-0 silk. The pouch was closed with interrupted 5-0 Vicryl and both skin incisions were closed with 5-0 Vicryl subcuticular suture and Dermabond. The patient tolerated surgery satisfactorily and was then prepared for transfer to the recovery room. ____________________________ Lenna Sciara. Rochel Brome, MD jws:slb D: 10/15/2012 10:54:55 ET T: 10/15/2012 11:06:22 ET JOB#: [id number]  cc: Loreli Dollar, MD, <Dictator> Loreli Dollar MD ELECTRONICALLY SIGNED 10/16/2012 18:27

## 2015-02-28 NOTE — Discharge Summary (Signed)
PATIENT NAME:  Lisa Becker, Lisa Becker MR#:  [id number] DATE OF BIRTH:  01/02/1934  DATE OF ADMISSION:  09/24/2012 DATE OF DISCHARGE:  09/28/2012  HISTORY: This 81 year old female came in through the outpatient surgery department. She has a history of ovarian cancer and total abdominal hysterectomy with bilateral salpingo-oophorectomy and small bowel resection. Recently had findings of a 3 cm mass in the retroperitoneum which was demonstrated on CT and PET scan.   PAST MEDICAL HISTORY: Recorded in the typed history and physical along with physical findings.   HOSPITAL COURSE: The patient was carried to the Operating Room where she had a laparotomy with a long midline incision. She had findings of extensive adhesive peritonitis and extensive lysis of adhesions and removed a retroperitoneal mass which was some 3 cm in dimension which was in close proximity to the vena cava and the aorta. The mass was completely excised. Postoperatively, she was treated with IV fluids and analgesics and soon began some walking, began with clear liquid diet and gradually advanced diet, and she made satisfactory progress. She did have some bowel movement prior to discharge.   final diagnosis demonstrated a malignant mass of the retroperitoneum, which is consistent with her ovarian primary cancer.   FINAL DIAGNOSES:  1. Retroperitoneal metastatic ovarian cancer. 2. Adhesive peritonitis.   OPERATION: Laparotomy with extensive lysis of adhesions and excision of a malignant retroperitoneal mass.         DISCHARGE INSTRUCTIONS/FOLLOWUP: Wound care instructions were given. Plans are made for follow-up in the office and also anticipate follow up with Dr. Oliva Bustard, in oncology. ____________________________ Lenna Sciara. Rochel Brome, MD jws:slb D: 10/09/2012 09:54:51 ET T: 10/09/2012 10:07:46 ET JOB#: [id number]  cc: Loreli Dollar, MD, <Dictator> Loreli Dollar MD ELECTRONICALLY SIGNED 10/10/2012 20:14

## 2015-02-28 NOTE — Op Note (Signed)
PATIENT NAME:  Lisa Becker, Lisa Becker MR#:  [id number] DATE OF BIRTH:  May 26, 1934  DATE OF PROCEDURE:  09/24/2012  PREOPERATIVE DIAGNOSIS: Retroperitoneal mass.  POSTOPERATIVE DIAGNOSES: 1. Retroperitoneal mass. 2. Adhesive peritonitis.   PROCEDURE: Laparotomy with excision of retroperitoneal mass and extensive lysis of adhesions.  SURGEON: Loreli Dollar, MD  ANESTHESIA: General.   INDICATIONS: This 81 year old female has history of ovarian cancer, has had total abdominal hysterectomy, bilateral salpingo-oophorectomy and segmental small bowel resection April 2010. She recently had a CT scan demonstrating 3 cm mass in the retroperitoneum. Also PET scan demonstrated activity in this mass and surgery was recommended for definitive treatment.   DESCRIPTION OF PROCEDURE: The patient was placed on the operating table in the supine position under general endotracheal anesthesia. The abdomen was prepared with ChloraPrep and draped in a sterile manner. An upper abdominal midline incision was made and carried down just to the right of the umbilicus to the old midline surgical scar and was carried below the umbilicus. Dissection was carried down through subcutaneous tissues. The midline fascia was incised and the peritoneal cavity was opened. It is noted multiple small bleeding points were cauterized. Initial entry to the abdomen revealed that there were multiple adhesions and multiple adhesions were taken down from the anterior abdominal wall. These were found between the small bowel and the abdominal wall. It was necessary to lengthen the incision both cephalad and caudad several times during the course of the procedure and ultimately reached a length of some 25 cm. The dissection of adhesions was carried out for approximately an hour mobilizing small bowel and dissected the omentum away from small bowel. Transverse colon was identified. Also found that there was some gaseous distention of the stomach and had the  anesthetist insert a nasogastric tube to decompress the stomach. Further exploration revealed there is no palpable mass within the liver. Additional dissection was carried out mobilizing small bowel and separating it from the omentum and the transverse colon. Also was able to put a hand down into the lower part of the abdomen and in the pelvis and did not identify any tumor mass with palpation although even with a long incision could not see down into the pelvis. It was noted that Bookwalter retractor was used and after satisfactory mobilization of the small bowel moist packs were inserted and retracted the small bowel towards the right upper quadrant. Identified the ligament of Treitz and duodenum. Also could palpate the aorta and identified a mass which was slightly to the right of the aorta closer to the vena cava. Next, the first part of the jejunum in the fourth part of the duodenum were mobilized so that the mass was more visible. It appeared that may be a large lymph node. The dissection of the mass took about an hour carefully dissecting around the mass and completely excising it. Multiple small potential bleeding points were controlled with medium hemoclips. The mass was in close proximity to the vena cava and dissection was carried out very carefully as the vena cava was exposed as the mass was mobilized and with additional dissection using right angle Kelly clamps and Kittner and forceps the mass was completely excised. It was firm. It was some 4 cm in dimension and was submitted to pathology for initial inspection and subsequent routine pathology. It is noted that after removing the mass there were several bleeding points which were cauterized. Several additional clips were applied to tissues both above and below the site of the mass  and also two points were suture ligated with 3-0 chromic.  The surrounding area was further inspected and did not identify any other tumor in this immediate area and after  watching this site for several minutes determined hemostasis was intact.   Next, the retractors were removed. Laps were removed and count was correct and what omentum was present was brought beneath the wound. The anesthetist removed the nasogastric tube. The abdomen was closed with interrupted 0 Maxon figure-of-eight sutures in the deep fascia. The skin was closed with clips. Dressings were applied with paper tape.   This procedure was significantly more difficult than typical case considering the obesity and extensive adhesive peritonitis and lengthy dissection of adhesions.   Patient appeared to be in satisfactory condition and was carried to the recovery room for postoperative care.  ____________________________ J. Rochel Brome, MD jws:cms D: 09/24/2012 18:49:16 ET T: 09/25/2012 09:22:38 ET  JOB#: [id number] cc: Loreli Dollar, MD, <Dictator> Loreli Dollar MD ELECTRONICALLY SIGNED 09/25/2012 17:35

## 2015-02-28 NOTE — Consult Note (Signed)
PATIENT NAME:  Lisa Becker, Lisa Becker MR#:  [id number] DATE OF BIRTH:  [birth year]  DATE OF CONSULTATION:  10/03/2012  REFERRING PHYSICIAN:   CONSULTING PHYSICIAN:  Georgene Kopper S. Quintell Bonnin, MD  HISTORY: The patient was operated on for retroperitoneal lymph node biopsy done roughly about a week ago and she came back to the Emergency Room today on 10/03/2012 because of abdominal pain and she says she feels very uncomfortable in her abdomen. She felt like a crampy abdominal pain. She is moving her bowels. She is also drinking and eating. She says she has little bit trouble urinating. The pain is mostly in the left side of the abdomen and is crampy in nature. There is no fever associated with it and it is relieved by nothing. She has no other complaints.   PAST MEDICAL HISTORY:  The patient had ovarian cancer.   SOCIAL HISTORY: The patient is a nonsmoker.   FAMILY HISTORY: Nothing important.   PHYSICAL EXAMINATION:  GENERAL: She is an obese female and she is lying slightly uncomfortably on her left side.   ABDOMEN: The main pain is mostly in the left side of the abdomen. The staples are intact. There are no ventral or incisional hernias. They are good bowel sounds present. On palpation on the left side, it looks like that I could feel omentum or small bowel. That is the place where she has localized tenderness.   I reviewed the CT scan with the radiologist and with the emergency physician. On the CT scan, basically, she has gas all the way down to the rectum. There is no evidence of a bowel obstruction. There is no evidence of any bleeding in the retroperitoneal space or free air in the abdomen.   Her white count was normal. Hemoglobin was normal and her urine examined showed urine esterase was positive and she had possibility of urinary tract infection.   EXTREMITIES: No problems.   NEUROLOGIC: Nothing bad.  IMPRESSION/RECOMMENDATIONS: Possible ileus in the abdomen which is very common after retroperitoneal  lymph node dissection. I told the ER physician that we should put her on Bentyl 10 mg twice a day for gas pains and also in the meantime give her ciprofloxacin for urinary tract infection and then Dr. Tamala Julian can evaluate her on Monday and if she is any problems, she can call me in my office.    ____________________________ Welford Roche. Phylis Bougie, MD msh:ap D: 10/03/2012 11:25:23 ET T: 10/03/2012 12:30:49 ET JOB#: [id number]  cc: Carrol Bondar S. Phylis Bougie, MD, <Dictator> J. Rochel Brome, MD Sharene Butters MD ELECTRONICALLY SIGNED 10/06/2012 11:09

## 2015-03-03 NOTE — H&P (Signed)
PATIENT NAME:  Lisa Becker, Lisa Becker MR#:  [id number] DATE OF BIRTH:  [birth year]  DATE OF ADMISSION:  11/01/2013  This is a 81 year old female, a patient of Dr. Emily Filbert, and is also under the care of Dr. Forest Gleason and Dr. Claiborne Rigg. She comes in the Emergency Room with chief complaint of severe lower abdominal pain. She was in her usual state of health until this morning when she had abrupt onset of intermittent lower abdominal pain. This is in the periumbilical area and extends down to the hypogastric area. She reports she had also associated nausea and vomiting. She had eaten breakfast and threw up her breakfast. Did not throw up any bile or blood. Did have a normal bowel movement this morning. Has not passed any blood with her bowel movement. She reports no associated chills or fever, and due to the pain and vomiting, did go to the Emergency Room where she was evaluated by the Emergency Room staff, and I did have CT scan, of which I have reviewed the images, for which she did ingest oral contrast. CT scan demonstrated dilatation of the stomach and some mild dilatation of the small bowel. Some contrast did enter the small bowel, and according to the radiologist, there appears to be transition area in the right lower quadrant involving ileum; appears to be partial small bowel obstruction. There is some gas and stool within the colon.   She has been admitted to the hospital and began on nasogastric suction. There was a large amount of fluid, which came out with the NG suction, and the patient has had some relief, but still needing some pain medicine.   PAST MEDICAL HISTORY: Was reviewed.   1.  Does have a history of ovarian cancer and did have total abdominal hysterectomy, bilateral salpingo-oophorectomy with small bowel resection in 2010, had chemotherapy. Later, she had a retroperitoneal mass, which I removed in 2013. This mass was about 3 cm in dimension and appeared to be metastasis from her  ovarian primary. She did have extensive adhesive peritonitis and had extensive lysis of adhesions at that time. She subsequently has undergone chemotherapy under the direction of Dr. Oliva Bustard. Last chemotherapy was March of this year. Last saw Dr. Oliva Bustard in the summer.  2.  She also has a history of asthma and does tenderness to get short of breath with walking, but no dyspnea at rest.  3.  She has no specific history of heart disease.   4.  Has had hyperlipidemia.  5.  Osteoporosis.  6.  Chronic low back pain. Did have a recent injection by Dr. Dossie Arbour. Has had lumbar laminectomy. Has had neuropathy affecting her legs.  7.  Has had bilateral total knee replacements. The right knee was done just a few months ago.  8.  Has had a left hip replacement.   MEDICATIONS: Include:  1.  Albuterol 2 puffs q.4 hours as needed.  2.  Aspirin 81 mg daily.  3.  Calcium with vitamin D daily.  4.  Citalopram 20 mg daily.  5.  Culturelle digestive health capsule.  6.  Gabapentin 100 mg 2 capsules 3 times a day.  7.  Klor-Con 10 mEq extended release, 2 tablets daily.  8.  Lipoic acid 300 mg daily.  9.  Magnesium hydroxide 8% oral suspension 2 times per day.  10.  Omega Q Plus 1 capsule 2 times per day.  11.  Odansetron 4 mg orally as needed for nausea.  12.  Pramipexole 0.25  mg daily.  13.  Simvastatin 20 mg daily.  14.  Torsemide 20 mg daily.  15.  Vitamin B12, 1000 mcg daily.  16.  Vitamin C 1000 mg daily.  17.  Vitamin D3, 2000 International Units daily.  18.  Vitron-C 125 mg/65 mg daily.   DRUG ALLERGIES: AMLODIPINE, METOCLOPRAMIDE, METRONIDAZOLE, SULFA ULTRAM.   SOCIAL HISTORY: Does not smoke. Does not drink any alcohol. Has somewhat limited activities. Does use a walker with her ambulation. She is accompanied by her daughter.   FAMILY HISTORY: Positive for Alzheimer disease.   REVIEW OF SYSTEMS:  Did just recently have some sinus congestion and a cough and was treated with Z-Pak and resolved.    She reports no recent visual changes. Does occasionally get choked on her saliva, but does not particularly have any trouble swallowing pills or food. She does report dyspnea on exertion and improvement in her cough. Occasionally has had chest pain, which she attributes to anxiety. This is at rest, not having any exertional chest pain. Does have the chronic and intermittent low back pain. She reports had recently been moving her bowels satisfactorily until today.  Did have a good bowel movement this morning. She has been voiding satisfactorily. Rarely gets any edema in her ankles. Does feel she is making some progress with recovery from right knee replacement. Does walk with a walker and just walks very slowly. Does tend to get short of breath with walking.    Her review of systems otherwise negative.   PHYSICAL EXAMINATION: GENERAL: She is awake, alert and oriented, resting in the hospital bed.    VITAL SIGNS:  Height is 5 foot 2, weight 210 pounds. BMI is 38.5.  Temperature 98, pulse 64, respirations 18, blood pressure 116/51, pulse ox of 93% on room air.  SKIN: Warm and dry without rash.    HEENT: Pupils equal, reactive to light. Extraocular movements are intact. Sclerae clear. Palpebral conjunctivae normal red color. Pharynx clear.  NECK: With no palpable mass. She does have an NG tube in place, which appears to be small in caliber, possibly a 14 flange. There is just some clear, colorless fluid coming out of the nasogastric tube.  RESPIRATORY:  Her lung sounds are clear.  HEART: Regular rhythm, S1 and S2.  ABDOMEN: Obese and soft. There is moderate periumbilical hypogastric tenderness. No palpable mass. Active bowel sounds.  RECTAL: Demonstrates no palpable mass. Good sphincter tone.  EXTREMITIES: With no pedal edema. Healing surgical scar at her right knee. Old surgical scar at left knee.    CLINICAL DATA: Lab work done today with glucose 133, BUN 37, creatinine 1.09. Liver panel with an  SGOT of 38. CBC with a white blood count of 18,900. Hemoglobin 12.9, platelet count 189,000.   CT images were reviewed. There is also a suggestion, by the radiologist, there may be a small lymph node identified in the region of surgical clips in the retroperitoneum, but there is no large visible mass.   IMPRESSION: 1.  Partial small bowel obstruction.  2.  History of adhesive peritonitis.  3.  History of ovarian cancer.  4.  Morbid obesity.  5.  Asthma.   I discussed care with her. Recommended continued observation with nasogastric suction. We will place this type NG tube on low intermittent suction, do a followup plain x-ray tomorrow, give her soapsuds enema.   I have discussed that in some cases a partial small bowel obstruction will resolve with time. We will give her some time,  give her some analgesics, and hopefully resolve.     ____________________________ J. Rochel Brome, MD jws:dmm D: 11/01/2013 18:35:57 ET T: 11/01/2013 19:27:59 ET JOB#: [id number]  cc: Loreli Dollar, MD, <Dictator> Rusty Aus, MD Loreli Dollar MD ELECTRONICALLY SIGNED 11/03/2013 17:55

## 2015-03-03 NOTE — Consult Note (Signed)
Brief Consult Note: Diagnosis: complication of vascular device.   Patient was seen by consultant.   Recommend further assessment or treatment.   Comments: Appears to be a local infiltratin of the port.  I highly doubt infection. Recommend: do not access the port for 2 weeks and before reinitiating using the port a portagram in specials should be done.  This can be done as an out patient.  Electronic Signatures: Hortencia Pilar (MD)  (Signed 12-Aug-14 20:07)  Authored: Brief Consult Note   Last Updated: 12-Aug-14 20:07 by Hortencia Pilar (MD)

## 2015-03-03 NOTE — Discharge Summary (Signed)
PATIENT NAME:  Lisa Becker, Lisa Becker MR#:  [id number] DATE OF BIRTH:  Feb 28, 1934  DATE OF ADMISSION:  06/21/2013 DATE OF DISCHARGE:  06/24/2013   DICTATING FOR: Jeneen Rinks P. Holley Bouche., MD  ADMITTING DIAGNOSIS: Degenerative arthrosis of right knee.   DISCHARGE DIAGNOSIS: Degenerative arthrosis of right knee.   HISTORY: The patient is a 81 year old female who has been followed at [facility] for progression of right knee pain. The patient has a long history of progressive right knee pain. She had x-rays taken in 2013 which showed changes notable to the medial compartment space. The patient had noticed a progression of her right knee pain. She had localized most of the pain along the medial aspect of the knee. Her pain was aggravated with weight-bearing activities. She had gotten to the point where she was using either a cane or wheelchair to ambulate. Of note, the patient has completed a course of chemotherapy for recurrent ovarian cancer in March 2014. She has a PICC line in the left upper chest. The patient states that the pain increased to the point that it was significantly interfering with her activities of daily living. X-rays taken in [facility] showed narrowing of the medial cartilage space with associated varus alignment. Osteophyte as well as subchondral sclerosis were noted. After discussion of the risks and benefits of surgical intervention, the patient expressed her understanding of the risks and benefits and agreed for plans for surgical intervention.   PROCEDURE: Right total knee arthroplasty using computer-assisted navigation.   ANESTHESIA: Spinal.   SOFT TISSUE RELEASE: Anterior cruciate ligament, posterior cruciate ligament, deep medial collateral ligament as well as the patellofemoral ligament.   IMPLANTS UTILIZED: DePuy Sigma size 2.5 posterior stabilized femoral component (cemented), size 2.5 MBT tibial component (cemented), a 32 mm 3-pegged oval dome  patella (cemented), and a 12.5 mm stabilized rotating platform polyethylene insert.   HOSPITAL COURSE: The patient tolerated the procedure very well. She had no complications. She was then taken to the PACU, where she was stabilized and then transferred to the orthopedic floor. She began receiving anticoagulation therapy of Lovenox 30 mg subcutaneous q.12 hours. She was fitted with TED stockings bilaterally. These were allowed to be removed 1 hour per 8-hour shift. The patient was also fitted with AVI compression foot pumps bilaterally set at 80 mmHg. Her calves have been nontender. There has been no evidence of any DVTs. Homans signs were negative. Heels were elevated off the bed using rolled towels.   The patient has denied any chest pains or shortness of breath. Vital signs have been stable. She has been afebrile. Hemodynamically, she was stable. No transfusions were given other than the Autovac transfusion given the first 6 hours postoperatively.   The patient's Port-A-Cath infiltrated on night 1 following surgery, and subsequently a surgical consult was obtained, and it was elected to leave the Port-A-Cath out since she was not using it anyway. The site was free of any drainage or signs of infection. No complications occurred from this.   Physical therapy was initiated on day 1 for gait training and transfers. She, upon being discharged, was ambulating greater than 55 feet. Had flexion of 82. The patient would like to sleep on her right side in a fetal position. We, at the patient's request, did put a knee immobilizer on her which she did not wear a lot. The patient also requested the CPM machine, which after 3 to 4 hours, she discontinued secondary to increased pain. Occupational therapy was also  initiated on day 1 for ADLs and assistive devices. She has done well. The patient's therapy has been somewhat limited secondary to deconditioning and also her medication. She takes a medication for restless  leg, and along with the narcotics, this seemed to give her a little bit of a sedative effect.   The patient's IV, Foley and Hemovac were discontinued on day 2 along with a dressing change. The wound was free of any drainage or signs of infection. Polar Care was reapplied to the surgical leg, maintaining a temperature of 40 to 50 degrees Fahrenheit.   DRUG ALLERGIES: AMLODIPINE, METOCLOPRAMIDE, SULFA DRUGS, ULTRAM, METRONIDAZOLE.   DISPOSITION: The patient is being discharged to a skilled nursing facility in improved stable condition.   DISCHARGE INSTRUCTIONS:  1. She is to continue weight-bearing as tolerated.  2. Continue to elevate the feet off the bed.  3. Thigh-high TED stockings are to be worn around the clock. These may be removed 1 hour per 8-hour shift.  4. Continue using a walker.  5. Continue PT for gait training and transfers.  6. OT for ADLs and assistive devices.  7. Incentive spirometer q.1 hour while awake. Encourage cough and deep breathing every 2 hours while awake.  8. Polar Care to the surgical leg, maintaining a temperature of 40 to 50 degrees Fahrenheit. 9. Change dressing as needed.  10. The patient has a followup appointment in the [facility] on August 26th at 915.  11. Call the clinic if any temperatures of 101.5 or greater or excessive bleeding.   MEDICATIONS:  1. Ascorbic acid 1000 mg daily.  2. Calcium citrate 1 tablet daily.  3. Vitamin D3 2000 units daily.  4. Celexa 20 mg daily.  5. Vitamin B12 1000 mcg daily. 6. Senokot-S 1 tablet b.i.d. 7. Gabapentin 200 mg b.i.d. 8. OxyContin 10 mg q.12 hour for 10 days, then discontinue.  9. Pantoprazole 40 mg b.i.d. 10. Potassium chloride ER 20 mEq daily. 11. Mirapex 0.25 mg usual schedule every day at 8:00 a.m. and 1600.  12. Zocor 20 mg at bedtime.  13. Demadex 20 mg q.a.m.  14. Lovenox 30 mg subcutaneous q.12 hours for 14 days, then discontinue and begin taking one 81 mg enteric-coated aspirin.  15.  Salmeterol Diskus 50 mcg inhaler 1 puff inhalation b.i.d.  16. Tylenol ES 500 to 1000 mg q.4-6 hours p.r.n.  17. Mylanta DS 30 mL q.4-6 hours p.r.n.  18. Dulcolax suppositories 10 mg rectally daily p.r.n.   19. Milk of Magnesia 30 mL b.i.d.  20. Zofran 4 mg q.6 hour p.r.n. 20. Oxycodone 5 to 10 mg q.4-6 hours p.r.n. 21. Albuterol inhaler 2 puffs 4 times daily p.r.n.  22. Enema soapsuds if no results with Milk of Magnesia or Dulcolax.   PAST MEDICAL HISTORY:  1. Arthritis.  2. Asthma.  3. Chickenpox.  4. Eczema.  5. Hyperlipidemia.  6. Osteoporosis.  7. Postmenopausal state.  8. Ovarian cancer.  9. Lymph node cancer.   ____________________________ Vance Peper, PA jrw:OSi D: 06/24/2013 07:39:13 ET T: 06/24/2013 08:03:05 ET JOB#: [id number]  cc: Vance Peper, PA, <Dictator> Carver Murakami PA ELECTRONICALLY SIGNED 06/24/2013 22:18

## 2015-03-03 NOTE — Op Note (Signed)
PATIENT NAME:  Lisa Becker, Lisa Becker MR#:  [id number] DATE OF BIRTH:  05-Jan-1934  DATE OF PROCEDURE:  06/21/2013  PREOPERATIVE DIAGNOSIS: Degenerative arthrosis of the right knee.   POSTOPERATIVE DIAGNOSIS: Degenerative arthrosis of the right knee.   PROCEDURE PERFORMED: Right total knee arthroplasty using computer-assisted navigation.   SURGEON: Skip Estimable, M.D.   ASSISTANT: Vance Peper, PA (required to maintain retraction throughout the procedure)   ANESTHESIA: Spinal.   ESTIMATED BLOOD LOSS: 50 mL.   FLUIDS REPLACED: 1400 mL of crystalloid.   TOURNIQUET TIME: 86 minutes.   DRAINS: Two medium drains for reinfusion system.   SOFT-TISSUE RELEASES: Anterior cruciate ligament, posterior cruciate ligament, deep medial collateral ligament, and patellofemoral ligament.   IMPLANTS UTILIZED: DePuy Sigma size 2.5 posterior stabilized femoral component (cemented), size 2.5 MBT tibial component (cemented), 32 mm three-peg oval dome patella (cemented), and a 12.5 mm stabilized rotating platform polyethylene insert.   INDICATIONS FOR SURGERY: The patient is a 81 year old female who has been seen for complaints of progressive right knee pain. X-rays demonstrated severe degenerative changes in a  tricompartmental fashion with relative varus deformity. After discussion of the risks and benefits of surgical intervention, the patient expressed understanding of the risks, benefits, and agreed with plans for surgical intervention.   PROCEDURE IN DETAIL: The patient was brought to the operating room and, after adequate spinal anesthesia was achieved, a tourniquet was placed on the patient's upper right thigh. The patient's right knee and leg were cleaned and prepped with alcohol and DuraPrep, draped in the usual sterile fashion. A "timeout" was performed as per usual protocol. The right lower extremity was exsanguinated using an Esmarch, and the tourniquet was inflated to 300 mmHg.   An anterior longitudinal  incision was made followed by a standard mid vastus approach. A moderate effusion was evacuated. The deep fibers of the medial collateral ligament were elevated in a subperiosteal fashion off the medial flare of the tibia so as to maintain a continuous soft-tissue sleeve. The patella was subluxed laterally and the patellofemoral ligament was incised. Inspection of the knee demonstrated severe degenerative changes with full-thickness loss of articular cartilage to the medial compartment. Prominent osteophytes were debrided using a rongeur. Anterior and posterior cruciate ligaments were excised. Two 4.0 mm Schanz pins were inserted into the femur and into the tibia for attachment of the array of trackers used for computer-assisted navigation. Hip center was identified using circumduction technique. Distal landmarks were mapped using the computer. The distal femur and proximal tibia were mapped using the computer.   Distal femoral cutting guide was positioned using computer-assisted navigation so as to achieve a 5-degree distal valgus cut. Cut was performed and verified using the computer. Distal femur was sized and it was felt that a size 2.5 femoral component was appropriate. A size 2.5 cutting guide was positioned and anterior cut was performed and verified using the computer. This was followed by completion of the posterior and chamfer cuts. Femoral cutting guide for central box was then positioned and the central box cut was performed.   Attention was then directed to the proximal tibia. Medial and lateral menisci were excised. The extramedullary tibial cutting guide was positioned using computer-assisted navigation so as to achieve 0 degree varus valgus alignment and 0 degree posterior slope. Cut was performed and verified using the computer. The proximal tibia was sized and it was felt that a size 2.5 tibial tray was appropriate. Tibial and femoral trials were inserted followed by insertion of first 10 and  subsequently a 12.5 mm polyethylene insert. This allowed for excellent mediolateral soft-tissue balancing, both in full extension and in flexion.   Finally, the patella was cut and prepared so as to accommodate a 32 mm three-peg oval dome patella. Patellar trial was placed and the knee was placed through a range of motion with excellent patellar tracking appreciated.   Femoral trial was removed after debridement of posterior osteophytes. Central post hole for the tibial component was reamed followed by insertion of a keel punch. Tibial tray was then removed. The cut surfaces of bone were irrigated with copious amounts of normal saline with antibiotic solution using pulsatile lavage and then suctioned dry. Polymethyl methacrylate cement with gentamicin was prepared in the usual fashion using a vacuum mixer. Cement was applied to the cut surface of the proximal tibia as well as along the undersurface of a size 2.5 MBT tibial component. The tibial component was positioned and impacted into place. Excess cement was removed using freer elevators. Cement was then applied to the cut surface of the femur as well as along the posterior flanges of a size 2.5 posterior stabilized femoral component. The femoral component was positioned and impacted into place. Excess cement was removed using freer elevators.   A 12.5 mm polyethylene trial was inserted and the knee was brought in full extension with steady axial compression applied. Finally, cement was applied to the cut surface to the backside of a 32 mm three-peg oval dome patella, and the patellar component was positioned and patellar clamp applied. Excess cement was removed using freer elevators.   After adequate curing of cement, the tourniquet was deflated after a total tourniquet time of 86 minutes. Hemostasis was achieved using electrocautery. The knee was irrigated with copious amounts of normal saline with antibiotic solution using pulsatile lavage and then  suctioned dry. The knee was inspected for any residual cement debris. Then 20 mL of 1.3% Exparel in 40 mL of normal saline was injected along the posterior capsule, medial and lateral gutters, and along the arthrotomy site. A 12.5 mm stabilized rotating platform polyethylene insert was inserted and the knee was placed through a range of motion. Again, excellent patellar tracking was noted and excellent medial and lateral soft-tissue balancing appreciated.   Two medium drains were placed in the wound bed and brought out through a separate stab incision to be attached to a reinfusion system. The medial parapatellar portion of the incision was reapproximated using interrupted sutures of #1 Vicryl. The subcutaneous tissue was approximated in layers using first #0 Vicryl followed by 2-0 Vicryl. Skin was closed with skin staples.   It should be noted that 30 mL of 0.25% Marcaine with epinephrine was injected along the subcutaneous tissue prior to closure.   The patient tolerated the procedure well. She was transported to the recovery room in stable condition.    ____________________________ Laurice Record. Holley Bouche., MD jph:np D: 06/21/2013 14:11:00 ET T: 06/21/2013 15:16:15 ET JOB#: [id number]  cc: Jeneen Rinks P. Holley Bouche., MD, <Dictator> JAMES P Holley Bouche MD ELECTRONICALLY SIGNED 07/21/2013 7:20

## 2015-03-05 NOTE — Op Note (Signed)
PATIENT NAME:  Lisa Becker, Lisa Becker MR#:  [id number] DATE OF BIRTH:  [birth year]  DATE OF PROCEDURE:  05/04/2012  PREOPERATIVE DIAGNOSIS: Degenerative arthrosis of the left hip.   POSTOPERATIVE DIAGNOSIS: Degenerative arthrosis of the left hip.   PROCEDURE PERFORMED: Left total hip arthroplasty.   SURGEON: Laurice Record. Holley Bouche., MD    ASSISTANT: Vance Peper, PA-C (required to maintain retraction throughout the procedure)   ANESTHESIA: General endotracheal.   ESTIMATED BLOOD LOSS: 300 mL.   FLUIDS REPLACED: 1400 mL of Crystalloid.   DRAINS: Two medium drains to Hemovac reservoir.   IMPLANTS UTILIZED: DePuy 12 mm large stature Prodigy femoral component, 50 mm outer diameter Pinnacle 100 acetabular component, +4 mm 10 degree Pinnacle Marathon polyethylene liner, and a 32 mm cobalt chrome hip ball with a +1 mm neck length.   INDICATIONS FOR SURGERY: The patient is a 81 year old female who has been seen for complaints of progressive left groin and hip pain. X-rays demonstrated severe degenerative changes with bone on bone articulation appreciated. After discussion of the risks and benefits of surgical intervention, the patient expressed her understanding of the risks and benefits and agreed with plans for surgical intervention.   PROCEDURE IN DETAIL: The patient was brought in the operating room and, after adequate general endotracheal anesthesia was achieved, the patient was placed in right lateral decubitus position. Axillary roll was placed and all bony prominences were well padded. The patient's left hip and leg were cleaned and prepped with alcohol and DuraPrep and draped in the usual sterile fashion. A "time-out" was performed as per usual protocol. A lateral curvilinear incision was made gently curving towards the posterior superior iliac spine. IT band was incised in line with the skin incision and fibers of gluteus maximus were split in line. Piriformis tendon was identified, skeletonized, and  incised at its insertion on the proximal femur and reflected posteriorly. In a similar fashion, short external rotators were incised and reflected posteriorly. A T-type posterior capsulotomy was performed. Prior to dislocation of the femoral head, a threaded Steinmann pin was inserted through a separate stab incision into the pelvis superior to the acetabulum and then bent in the form of a stylus so as to assess limb length and hip offset throughout the procedure. Femoral head was then dislocated posteriorly. Severe degenerative changes were noted with full thickness loss of articular cartilage. Femoral neck cut was performed using oscillating saw. Osteophytes around the femoral neck were removed using a rongeur. The anterior capsule was elevated off the femoral neck. Inspection of the acetabulum also demonstrated significant degenerative changes. Remnant of labrum was excised. The acetabulum was reamed in a sequential fashion up to a 49 mm diameter. Good punctate bleeding bone was encountered. A 50 mm outer diameter Pinnacle 100 acetabular component was positioned and impacted into place. A +4 neutral polyethylene trial was inserted and attention was directed to the proximal femur. Pilot hole for reaming of the proximal femoral canal was created using a high-speed bur. Proximal femoral canal was reamed in a sequential fashion up to an 11.5 mm diameter. Proximal femur was prepared using a 12 mm side-biting reamer. Serial broaches were inserted up to a 12 mm large stature broach. Calcar region was planed and trial reduction was performed using first an AML and subsequently a Prodigy neck segment. The leg length was felt to be somewhat long. The broach was countersunk and additional bone was removed using calcar planer. The +4 neutral polyethylene trial was replaced with a +4 mm 10  degree lip with the high side at approximately 4 o'clock position. Trial reduction was then performed with Prodigy head and neck segment  with +1 mm neck length. Good equalization of limb lengths was noted and good reproduction of hip offset. Excellent stability was appreciated both anteriorly and posteriorly. Trial components were removed. The +4 mm 10 degree Pinnacle Marathon polyethylene liner was positioned with the high side at the 4 o'clock position and impacted into place. A 12 mm large stature Prodigy stem was positioned and measurements were obtained. Approximately 7.5 cm of scratch fit would be achieved. It was selected to use a 12 mm reamer so as to achieve line-to-line fit. This still allowed for excellent scratch fit. The femoral component was positioned and impacted into place. The Morse taper was cleaned and dried. A 32 mm cobalt chrome hip ball with a +1 mm neck segment was placed on the trunnion and impacted into place. Hip was then reduced and placed through a range of motion. Excellent stability was appreciated both anteriorly and posteriorly. Good equalization of limb lengths and hip offset was appreciated.   Wound was irrigated with copious amounts of normal saline with antibiotic solution using pulsatile lavage and suctioned dry. Hemostasis was appreciated. Posterior capsulotomy was repaired using a #5 Ethibond. Piriformis tendon was reapproximated on the undersurface of the gluteus medius tendon using #5 Ethibond. Two medium Hemovac drains were placed in the wound bed and brought out through a separate stab incision to be attached to Hemovac reservoir. The IT band was repaired using interrupted sutures of #1 Vicryl. Subcutaneous tissue was approximated in layers using first a running suture of Vicryl for the deep layer and interrupted sutures of #0 Vicryl. Final layer was closed with #2-0 Vicryl. Skin was closed with skin staples. Sterile dressing was applied.   The patient tolerated the procedure well. She was transported to the recovery room in stable condition.   ____________________________ Laurice Record. Holley Bouche.,  MD jph:drc D: 05/04/2012 10:56:43 ET T: 05/04/2012 11:05:20 ET JOB#: [id number]  cc: Jeneen Rinks P. Holley Bouche., MD, <Dictator> JAMES P Holley Bouche MD ELECTRONICALLY SIGNED 05/07/2012 22:42

## 2015-03-05 NOTE — Discharge Summary (Signed)
PATIENT NAME:  Lisa Becker, Lisa Becker MR#:  [id number] DATE OF BIRTH:  Oct 04, 1934  DATE OF ADMISSION:  05/04/2012 DATE OF DISCHARGE:    ADDENDUM  DISCHARGE MEDICATIONS:  1. Mylanta DS 30 mL q.6 hours p.r.n.  2. Roxicodone 5 to 10 mg q.4-6 hours p.r.n. for pain. 3. Tylenol ES 500 to 1000 mg q.4 hours p.r.n. for pain and temperature.  4. Senokot-S 1 tablet b.i.d.  5. Milk of magnesia 30 mL b.i.d.  6. Dulcolax suppository 10 mg rectally daily p.r.n. for constipation.  7. Enema soapsuds if no results with milk of magnesia or Dulcolax.  8. Pantoprazole 40 mg b.i.d.  9. Albuterol oral inhaler 2 puffs q.4 hours while awake p.r.n.  10. OptiChamber 1 inhalation p.r.n.  11. Vitamin D3 1000 mg daily. 12. Mirapex 0.25 mg usual schedule 9:00 a.m. and 17:00. 13. Demadex 20 mg daily. 14. Zocor 20 mg at bedtime.  15. Serevent Diskus inhaler 1 puff INH. 16. Neurontin 100 mg t.i.d.  17. RisaQuad 1 capsule 20 per day. 18. Trinsicon 1 capsule daily with meal.  19. Lovenox 30 mg sub-Q q.12 hours for 14 days, then discontinue and begin taking one 81 mg enteric-coated aspirin.   ____________________________ Vance Peper, PA jrw:cms D: 05/07/2012 07:19:33 ET T: 05/07/2012 07:35:35 ET JOB#: [id number]  cc: Vance Peper, PA, <Dictator> JON WOLFE PA ELECTRONICALLY SIGNED 05/09/2012 21:09

## 2015-03-05 NOTE — Discharge Summary (Signed)
PATIENT NAME:  Lisa Becker, HIETPAS MR#:  [id number] DATE OF BIRTH:  1934/07/08  DATE OF ADMISSION:  05/04/2012 DATE OF DISCHARGE:  05/07/2012   ADMITTING DIAGNOSIS: Degenerative arthrosis of left hip.   DISCHARGE DIAGNOSIS: Degenerative arthrosis of left hip.   HISTORY: The patient is a 81 year old who has been followed at [facility] for progression of left hip discomfort. The patient was initially seen last July complaining of pain to the left buttock region radiating to the groin region as well. She had been having problems for approximately six weeks prior to that time. To her knowledge she had had no injury or any change of activities. Her condition has gradually intensified. She states that she is having a lot of discomfort at nighttime. She was experiencing pain radiating down the leg to the knee. She also stated that she was having a lot of discomfort at nighttime. She had denied any numbness, tingling, burning sensation to the lower extremity. The patient had been referred to physical therapy and this seemed to make her condition worse and subsequently she discontinued this. She had been taking anti-inflammatory medications with no improvement in her condition. The pain had increased to the point that it was significantly interfering with her activities of daily living. X-rays taken in [facility] showed significant narrowing of the cartilage space with bone-on-bone articulation noted superiorly. There was flattening of the femoral head as well as some subchondral sclerosis. After discussion of the risks and benefits of surgical intervention, the patient expressed her understanding of the risks and benefits and agreed for plans for surgical intervention.   PROCEDURE: Left total hip arthroplasty.   ANESTHESIA: General endotracheal.   IMPLANTS UTILIZED: DePuy 12 mm large statue Prodigy femoral component, 50 mm outer diameter Pinnacle 100 acetabular component, +4 mm 10 degree  Pinnacle Marathon polyethylene liner, and a 32 mm cobalt chrome hip ball with a +1 mm neck length.   HOSPITAL COURSE: The patient tolerated the procedure very well. She had no complications. She was then taken to PAC-U where she was stabilized and then transferred to the orthopedic floor. She began receiving anticoagulation therapy of Lovenox 30 mg sub-Q q.12 hours per anesthesia and pharmacy protocol. She was fitted with TED stockings bilaterally. These were allowed to be removed one hour per eight hour shift. She was also fitted with the AV-I compression foot pumps bilaterally set at 80 mmHg. She has voiced no evidence of any calf pain. There has been no evidence of any deep venous thromboses. Negative Homans sign. The patient's heels were elevated off the bed using rolled towels.   The patient has denied any chest pain or shortness of breath. Vital signs have been stable. She had been afebrile except on the third day after surgery when she did run a low temp. However, after discussing this with the patient she admits to not using incentive spirometer. She also has not been real active which would help stimulate the lungs and with her breathing condition I think this contributed to the low-grade temp. However, she was kept in the hospital to be sure the temperature did reduce, which it did, and subsequently was transferred to skilled nursing facility.   Physical therapy was initiated on day one for gait training and transfers. She has done well. She has been extremely slow. Some of this is due to deconditioning. It does not appear to be that much because of pain but fear factor as well. Occupational therapy was also initiated on day one  for activities of daily living and assistive devices.   The patient's IV, Hemovac, and catheter were discontinued on day two along with a dressing change. The wound was free of any signs of infection. She was noted to have some drainage on day one after removal of the  dressing change and this was subsequently changed. However, the wound has looked very good.   DISPOSITION: The patient is being discharged to skilled nursing facility in improved stable condition.   DISCHARGE INSTRUCTIONS:  1. The patient may weight bear as tolerated. Continue using a walker until cleared by physical therapy to go to a quad cane.  2. She will receive PT for gait training and transfers, OT for activities of daily living and assistive devices.  3. She may weight bear as tolerated.  4. Elevate heels off the bed.  5. Continue use of stockings. These are to be worn around-the-clock but may be removed one hour per eight hour shift.  6. Encourage incentive spirometer q.1 hour while awake.  7. Encourage cough and deep breathing q.2 hours while awake.  8. Abduction pillow between legs when in bed.  9. She is placed on a regular diet.  10. She does have a follow-up appointment in the clinic in six weeks.  11. She is to call the clinic sooner if any temperatures of 101.5 or greater or excessive bleeding.  12. Staples will be removed on July 8th followed by application of benzoin and Steri-Strips.   DRUG ALLERGIES: Ultram and metronidazole.   PAST MEDICAL HISTORY:  1. Arthritis.  2. Asthma.  3. Chickenpox.  4. Eczema.  5. Hyperlipidemia.  6. Osteoporosis. 7. Postmenopausal state. 8. Ovarian cancer.   ____________________________ Vance Peper, PA jrw:drc D: 05/07/2012 07:14:03 ET T: 05/07/2012 07:43:43 ET JOB#: [id number]  cc: Vance Peper, PA, <Dictator> Kayleah Appleyard PA ELECTRONICALLY SIGNED 05/09/2012 21:09

## 2015-03-06 ENCOUNTER — Encounter
Admit: 2015-03-06 | Disposition: A | Discharge status: Home / Self Care | Payer: Self-pay | Attending: Neurosurgery | Admitting: Neurosurgery

## 2015-03-08 ENCOUNTER — Other Ambulatory Visit: Payer: Self-pay | Admitting: *Deleted

## 2015-03-08 ENCOUNTER — Ambulatory Visit
Admit: 2015-03-08 | Disposition: A | Discharge status: Home / Self Care | Payer: Self-pay | Attending: Oncology | Admitting: Oncology

## 2015-03-08 DIAGNOSIS — C561 Malignant neoplasm of right ovary: Secondary | ICD-10-CM

## 2015-03-08 LAB — CBC CANCER CENTER
BASOS ABS: 0 x10 3/mm (ref 0.0–0.1)
Basophil %: 0.5 %
Eosinophil #: 0.4 x10 3/mm (ref 0.0–0.7)
Eosinophil %: 4.9 %
HCT: 32.9 % — AB (ref 35.0–47.0)
HGB: 11.1 g/dL — ABNORMAL LOW (ref 12.0–16.0)
LYMPHS PCT: 21.8 %
Lymphocyte #: 1.7 x10 3/mm (ref 1.0–3.6)
MCH: 29.5 pg (ref 26.0–34.0)
MCHC: 33.8 g/dL (ref 32.0–36.0)
MCV: 88 fL (ref 80–100)
MONOS PCT: 7.2 %
Monocyte #: 0.6 x10 3/mm (ref 0.2–0.9)
NEUTROS ABS: 5.2 x10 3/mm (ref 1.4–6.5)
Neutrophil %: 65.6 %
Platelet: 217 x10 3/mm (ref 150–440)
RBC: 3.76 10*6/uL — ABNORMAL LOW (ref 3.80–5.20)
RDW: 14 % (ref 11.5–14.5)
WBC: 7.9 x10 3/mm (ref 3.6–11.0)

## 2015-03-08 LAB — COMPREHENSIVE METABOLIC PANEL
ALBUMIN: 3.6 g/dL
ALK PHOS: 72 U/L
AST: 26 U/L
Anion Gap: 7 (ref 7–16)
BUN: 26 mg/dL — ABNORMAL HIGH
Bilirubin,Total: 0.5 mg/dL
CALCIUM: 8.4 mg/dL — AB
Chloride: 99 mmol/L — ABNORMAL LOW
Co2: 29 mmol/L
Creatinine: 1.11 mg/dL — ABNORMAL HIGH
EGFR (African American): 55 — ABNORMAL LOW
EGFR (Non-African Amer.): 48 — ABNORMAL LOW
GLUCOSE: 114 mg/dL — AB
Potassium: 4 mmol/L
SGPT (ALT): 20 U/L
SODIUM: 135 mmol/L
Total Protein: 6.8 g/dL

## 2015-03-14 ENCOUNTER — Ambulatory Visit: Payer: Medicare Other | Attending: Neurosurgery | Admitting: Physical Therapy

## 2015-03-14 ENCOUNTER — Encounter: Payer: Medicare Other | Admitting: Physical Therapy

## 2015-03-14 ENCOUNTER — Encounter: Payer: Self-pay | Admitting: Physical Therapy

## 2015-03-14 ENCOUNTER — Other Ambulatory Visit: Payer: Self-pay | Admitting: *Deleted

## 2015-03-14 DIAGNOSIS — M4316 Spondylolisthesis, lumbar region: Secondary | ICD-10-CM | POA: Diagnosis not present

## 2015-03-14 DIAGNOSIS — M545 Low back pain: Secondary | ICD-10-CM | POA: Insufficient documentation

## 2015-03-14 DIAGNOSIS — C569 Malignant neoplasm of unspecified ovary: Secondary | ICD-10-CM

## 2015-03-14 DIAGNOSIS — M62830 Muscle spasm of back: Secondary | ICD-10-CM | POA: Insufficient documentation

## 2015-03-14 NOTE — Therapy (Signed)
[facility] [address] [place], [address] Phone: [phone]   Fax:  [phone]  Physical Therapy Treatment  Patient Details  Name: Lisa Becker MRN: [id number] Date of Birth: 01-Jun-1934 Referring Provider:  Rusty Aus, MD  Encounter Date: 03/14/2015      PT End of Session - 03/14/15 1133    Visit Number 3   Number of Visits 8   Date for PT Re-Evaluation 04/03/15   Authorization Type 3   Authorization Time Period 10   PT Start Time 1037   PT Stop Time 1115   PT Time Calculation (min) 38 min      Past Medical History  Diagnosis Date  . Hypertension   . Anemia   . Sleep apnea     CPAP in use q night   . Pulmonary hypertension     followed by Dr. Raul Del, [facility], PFT's done at that last visit- mid April- 2015  . Shortness of breath   . Depression   . Nervous indigestion     uses one TUM, if this occurs (maybe once per month)    . Arthritis     lumbar stenosis  . Neuromuscular disorder     neuropathy hands, feet, legs   . Cancer 2010    ovarian, reoccurence - 2013, treatment completed through porta cath. 2014     Past Surgical History  Procedure Laterality Date  . Total hip arthroplasty  2013  . Knee surgery Left 2006  . Eus  08/20/2012    Procedure: UPPER ENDOSCOPIC ULTRASOUND (EUS) LINEAR;  Surgeon: Milus Banister, MD;  Location: [facility];  Service: Endoscopy;  Laterality: N/A;  radial/ linear patti/ebp   . Joint replacement Right     both knees have been replaced    . Tonsillectomy    . Abdominal hysterectomy    . Back surgery  2003    prior to 2003 episode, had back surgery (lumbar)- 1989  . Laparotomy  2013    for mass in abdomen     There were no vitals filed for this visit.  Visit Diagnosis:  Left low back pain, with sciatica presence unspecified  Muscle spasm of back      Subjective Assessment - 03/14/15 1053    Subjective Patient reports having  increased pain in left side of lower back and saw Dr. Consuela Mimes yesterday and was sent for X rays of back    Limitations Sitting;Standing;House hold activities   How long can you sit comfortably? as long as she would like   How long can you stand comfortably? <15 min.   How long can you walk comfortably? 20 feet   Patient Stated Goals Patient would like to have less back pain to be able to walk further    Currently in Pain? Yes   Pain Score 6    Pain Location Back   Pain Orientation Left   Pain Descriptors / Indicators Stabbing;Sharp;Spasm   Pain Type Acute pain   Pain Onset More than a month ago   Pain Frequency Intermittent   Aggravating Factors  walking   Pain Relieving Factors ice and rest   Multiple Pain Sites No            [facility] PT Assessment - 03/14/15 0001    Assessment   Medical Diagnosis Low back pain   Onset Date 11/11/14   Next MD Visit 04/03/2015   Precautions   Precautions None  Restrictions   Weight Bearing Restrictions No   Balance Screen   Has the patient fallen in the past 6 months Yes   How many times? 1   Has the patient had a decrease in activity level because of a fear of falling?  No   Is the patient reluctant to leave their home because of a fear of falling?  No            SUBJECTIVE: See above  PAIN (with rating/descriptors) see above  OBJECTIVE:  Gait: antalgic with forward trunk lean, using Rolator for support on arrival Palpation: + spasms and increased tenderness left side lower lumbar spine L4-S1 region  1. Therapist applied (4) electrodes for high vollt estim. to lower lumbar and into gluteus medius muscles bilaterally with patient seated with ice applied to same for pain control and reduction of spasms with patient response being able to transfer sit to stand and walk out of clinic with 50% reduction in symptoms with improved weight shift to left LE, she continued with forward trunk lean with standing and walkng 2. Instructed patient  in proper positioning in chair using lumbar support/towel roll, reviewed home exercises to be performed (hip adduction with ball with glute sets, hip abduction with resistive band),  and modifications to activity and exercise: decrease repetitions to 3-5 each and decreased intensity of contractions to submaximal, she is to get up every 30 min. To move around and is to lie on left side at night with pillows between knees to decreased back strain. Patient verbalized understanding of home exercises and the need to move more often as she was sitting for extended periods of time (>2 hours) before getting up and moving, She requires additional instruction for posture correction/exercise progression for pain control and strength/control  Outcome Measure: modified Oswestry: 60% (03/06/2015) severe self perceived disability: in need of intervention  CLINICAL IMPRESSION:Patient continues with pain in left hip and lower back which limits her function with walking and mobility in the home and community. She responded favorably with estim. and ice and modification of exercise along with working on proper positioning for sitting, walking, lying in bed.    PLAN: continue for pain control and flexibility/control and strengthening exercises/soft tissue mobilization as indicated 2x/week. PT EVAL, ULTRASOUND EA 79M, NON WND ELECT STIM, THERAP EXER-ROM EA 79M, MANUAL THERAPY; EA 15 MIN, CRYOTHERAPY(COLD) OR THERMOTHERAPY(HEAT)   Frequency & Duration 2/Week X 4Week(s)                   PT Education - 03/14/15 1129    Education provided Yes   Education Details Instructed in proper posture/positioning including use of pillows, towel roll or lumbar support for sitting lying in bed and to get up every 30 min. to walk around to decreased back strain/spasms, use ice as needed    Person(s) Educated Patient   Methods Explanation   Comprehension Verbalized understanding             PT Long Term Goals -  03/14/15 1138    PT LONG TERM GOAL #1   Title . Patient will report pain level 3/10 max. on NRPS for lower back/ LE with lying down in bed and walking activties    Time 4   Period Weeks   Status New   PT LONG TERM GOAL #2   Title Patient will be independent with home program for self management exercises, pain control and posture awareness    Time 4   Period Weeks  Status New   PT LONG TERM GOAL #3   Title Patient will demonstrate improved perceived disability on Modified Oswestry to 40% or less    Time 4   Period Weeks   Status New               Problem List Patient Active Problem List   Diagnosis Date Noted  . Lumbago 05/08/2014  . Spondylolisthesis of lumbar region 05/05/2014    Aldona Lento 03/14/2015, 11:45 AM  [facility] [address], [place], [address] Phone: [phone]   Fax:  [phone]

## 2015-03-16 ENCOUNTER — Encounter: Payer: Medicare Other | Admitting: Physical Therapy

## 2015-03-17 ENCOUNTER — Ambulatory Visit: Payer: Medicare Other | Admitting: Physical Therapy

## 2015-03-17 ENCOUNTER — Encounter: Payer: Self-pay | Admitting: Physical Therapy

## 2015-03-17 DIAGNOSIS — M545 Low back pain: Secondary | ICD-10-CM

## 2015-03-17 DIAGNOSIS — M62830 Muscle spasm of back: Secondary | ICD-10-CM

## 2015-03-17 NOTE — Therapy (Signed)
[facility] [address] [place], [address] Phone: [phone]   Fax:  [phone]  Physical Therapy Treatment  Patient Details  Name: BETHANEY OSHANA MRN: [id number] Date of Birth: Oct 06, 1934 Referring Provider:  Rusty Aus, MD  Encounter Date: 03/17/2015      PT End of Session - 03/17/15 1417    Visit Number 4   Number of Visits 8   Date for PT Re-Evaluation 04/03/15   Authorization Type 4   Authorization Time Period 10   PT Start Time 1030   PT Stop Time 1115   PT Time Calculation (min) 45 min   Activity Tolerance Patient tolerated treatment well      Past Medical History  Diagnosis Date  . Hypertension   . Anemia   . Sleep apnea     CPAP in use q night   . Pulmonary hypertension     followed by Dr. Raul Del, [facility], PFT's done at that last visit- mid April- 2015  . Shortness of breath   . Depression   . Nervous indigestion     uses one TUM, if this occurs (maybe once per month)    . Arthritis     lumbar stenosis  . Neuromuscular disorder     neuropathy hands, feet, legs   . Cancer 2010    ovarian, reoccurence - 2013, treatment completed through porta cath. 2014     Past Surgical History  Procedure Laterality Date  . Total hip arthroplasty  2013  . Knee surgery Left 2006  . Eus  08/20/2012    Procedure: UPPER ENDOSCOPIC ULTRASOUND (EUS) LINEAR;  Surgeon: Milus Banister, MD;  Location: [facility];  Service: Endoscopy;  Laterality: N/A;  radial/ linear patti/ebp   . Joint replacement Right     both knees have been replaced    . Tonsillectomy    . Abdominal hysterectomy    . Back surgery  2003    prior to 2003 episode, had back surgery (lumbar)- 1989  . Laparotomy  2013    for mass in abdomen     There were no vitals filed for this visit.  Visit Diagnosis:  Left low back pain, with sciatica presence unspecified  Muscle spasm of back      Subjective Assessment -  03/17/15 1408    Subjective Patient reports she is still having pain in lower back left side. She feels that the physical therapy treatments offer temporary relief of her pain. She is getting up at least every hour to move in order to help with pain.    Limitations Sitting;Standing;House hold activities   How long can you sit comfortably? as long as she would like   How long can you stand comfortably? <15 min.   How long can you walk comfortably? 20 feet   Patient Stated Goals Patient would like to have less back pain to be able to walk further    Currently in Pain? Yes   Pain Score 6    Pain Location Back   Pain Orientation Left;Lower   Pain Descriptors / Indicators Aching;Spasm;Stabbing   Pain Type Acute pain   Pain Onset More than a month ago   Pain Frequency Intermittent   Aggravating Factors  walking   Pain Relieving Factors ice, rest   Multiple Pain Sites No       Observation:  Gait: ambulating into clinic using rolator with UE support with forward flexed trunk Palpation: increased  tone and spasm along lumbar paraspinal muslces                  [facility] Adult PT Treatment/Exercise - 03/17/15 0001    Modalities   Modalities Electrical Stimulation;Cryotherapy   Moist Heat Therapy   Number Minutes Moist Heat --   Moist Heat Location --  lower back, lumbar spine   Cryotherapy   Number Minutes Cryotherapy 20 Minutes   Cryotherapy Location Back   Type of Cryotherapy Ice pack   Electrical Stimulation   Electrical Stimulation Location bilateral lumar spine    Electrical Stimulation Action High volt estim. (4) electrodes applied per therapist to lumbar spine paraspinal muscles    Electrical Stimulation Parameters continuous mode, intensity to tolerance x 20 min. with patient seated with ice pack applied to back simultaneously   Electrical Stimulation Goals Pain;Other (comment)  decrease muscle spasms     Patient demonstrated improved ability to walk with more  upright posture and decreased forward trunk lean following high volt estim. Treatment. Pain level improved from 6/10 to 4/10.           PT Education - 03/17/15 1409    Education provided Yes   Education Details Instructed patient in proper posture/positining including use of pillows for support of lumbar spine with sitting in chair and car etc. worked on posture for standing and walking with more upright position, decresaed forward trunk flexion   Person(s) Educated Patient   Methods Explanation;Demonstration   Comprehension Verbalized understanding;Returned demonstration             PT Long Term Goals - 03/14/15 1138    PT LONG TERM GOAL #1   Title . Patient will report pain level 3/10 max. on NRPS for lower back/ LE with lying down in bed and walking activties    Time 4   Period Weeks   Status New   PT LONG TERM GOAL #2   Title Patient will be independent with home program for self management exercises, pain control and posture awareness    Time 4   Period Weeks   Status New   PT LONG TERM GOAL #3   Title Patient will demonstrate improved perceived disability on Modified Oswestry to 40% or less    Time 4   Period Weeks   Status New               Plan - 03/17/15 1418    Clinical Impression Statement Patient demonstrated decreased lower back pain with improved abiltiy to stand and walk with less forward flexion of trunk with using rolator for UE support. She is more aware of proper posture for standing and walking and how to support back with sitting in chair and car. She will benefit from additional physical therapy intervention to achieve goals for improved walking with less difficulty and pain.    Pt will benefit from skilled therapeutic intervention in order to improve on the following deficits Difficulty walking;Pain;Increased muscle spasms;Decreased strength   Rehab Potential Good   Clinical Impairments Affecting Rehab Potential chronic condition of back  with previous  surgery   PT Frequency 2x / week   PT Duration 4 weeks   PT Next Visit Plan Pain control with modalities, progressive exercise and home instruction for posture awareness    Consulted and Agree with Plan of Care Patient        Problem List Patient Active Problem List   Diagnosis Date Noted  . Lumbago 05/08/2014  . Spondylolisthesis  of lumbar region 05/05/2014    Jomarie Longs, PT   03/17/2015, 5:45 PM  [facility] [address] [place], [address] Phone: [phone]   Fax:  [phone]

## 2015-03-20 ENCOUNTER — Ambulatory Visit: Payer: Medicare Other | Admitting: Physical Therapy

## 2015-03-20 ENCOUNTER — Encounter: Payer: Self-pay | Admitting: Physical Therapy

## 2015-03-20 DIAGNOSIS — M545 Low back pain: Secondary | ICD-10-CM

## 2015-03-20 DIAGNOSIS — M4316 Spondylolisthesis, lumbar region: Secondary | ICD-10-CM

## 2015-03-20 NOTE — Therapy (Signed)
[facility] [address], [place], [address] Phone: [phone]   Fax:  [phone]  Physical Therapy Treatment  Patient Details  Name: Lisa Becker MRN: [id number] Date of Birth: 1934-05-09 Referring Provider:  Rusty Aus, MD  Encounter Date: 03/20/2015      PT End of Session - 03/20/15 1025    Visit Number 5   Number of Visits 8   Date for PT Re-Evaluation 04/03/15   Authorization Type 5   Authorization Time Period 10   PT Start Time 1002   PT Stop Time 3300   PT Time Calculation (min) 41 min   Activity Tolerance Patient limited by pain;Patient tolerated treatment well   Behavior During Therapy [facility] for tasks assessed/performed      Past Medical History  Diagnosis Date  . Hypertension   . Anemia   . Sleep apnea     CPAP in use q night   . Pulmonary hypertension     followed by Dr. Raul Del, [facility] Bryant, PFT's done at that last visit- mid April- 2015  . Shortness of breath   . Depression   . Nervous indigestion     uses one TUM, if this occurs (maybe once per month)    . Arthritis     lumbar stenosis  . Neuromuscular disorder     neuropathy hands, feet, legs   . Cancer 2010    ovarian, reoccurence - 2013, treatment completed through porta cath. 2014     Past Surgical History  Procedure Laterality Date  . Total hip arthroplasty  2013  . Knee surgery Left 2006  . Eus  08/20/2012    Procedure: UPPER ENDOSCOPIC ULTRASOUND (EUS) LINEAR;  Surgeon: Milus Banister, MD;  Location: [facility];  Service: Endoscopy;  Laterality: N/A;  radial/ linear patti/ebp   . Joint replacement Right     both knees have been replaced    . Tonsillectomy    . Abdominal hysterectomy    . Back surgery  2003    prior to 2003 episode, had back surgery (lumbar)- 1989  . Laparotomy  2013    for mass in abdomen     There were no vitals filed for this visit.  Visit Diagnosis:  Left low back pain, with  sciatica presence unspecified  Spondylolisthesis of lumbar region      Subjective Assessment - 03/20/15 1005    Subjective Patient reports she is still having pain in lower back left side. She feels that the physical therapy treatments offer temporary relief of her pain. She is today having pain radiating to both hips. She reports the pain came on Saturday (03/18/15) after she was sitting and cutting patterns.    Limitations Sitting;Standing;House hold activities   How long can you sit comfortably? as long as she would like   How long can you stand comfortably? can't stand at all without pain limiting her   How long can you walk comfortably? not even room to room, arrived in wheelchair   Patient Stated Goals Patient would like to have less back pain to be able to walk further    Currently in Pain? Yes   Pain Score 8    Pain Location Back   Pain Orientation Left;Lower;Right   Pain Descriptors / Indicators Aching   Pain Type Acute pain   Pain Onset In the past 7 days   Pain Frequency Intermittent   Aggravating Factors  walking /moving at all  Pain Relieving Factors ice, rest, TENS   Effect of Pain on Daily Activities limiling waling, household and personal care      Objective:   Observation: patient arrived in clinic in wheelchair, transferred wheelchair to chair for treatment with slow antagic pattern with decreased weight shift to left and increased pain reported Strength: gross strength tested in both LE's: WFL's both LE's hip flexion, knee flexion/extention, ankle DF and great toe extension Palpation: + spasms and tenderness with increased warmth lower lumbar spine into gluteal region bilaterally    patient response to treatment: Exercises were deferred and concentration was focused on pain control: decreased pain in lower back to 4-5/10 and able to transfer sit to stand with increased weight bearing through left LE, transferred to car with moderate difficulty (SUV) patient  verbalized understanding of modified pain control/activity for home  She is going to see MD tomorrow 03/21/15 for assessment/review of X rays taken last week.                [facility] Adult PT Treatment/Exercise - 03/20/15 0001    Modalities   Modalities Electrical Stimulation;Cryotherapy   Cryotherapy   Number Minutes Cryotherapy 20 Minutes   Cryotherapy Location Back   Type of Cryotherapy Ice pack   Electrical Stimulation   Electrical Stimulation Location bilateral lumar spine    Electrical Stimulation Action high volt estim. (4) electrodes applied per therapist to lumbar spine paraspinal muslce and upper gluteal region    Electrical Stimulation Parameters continuous mode, intensity to tolerance with patient seated with ice pack applied to back  simultanwiously   Electrical Stimulation Goals Pain;Other (comment)  decrease muscle spasms                PT Education - 03/20/15 1021    Education provided Yes   Education Details Instructed patient to lie down every 1-2 hours in neutral position (on her side) or be up and moving more often  and to use ice for pain control as well every 1-2 hours   Person(s) Educated Patient   Methods Explanation   Comprehension Verbalized understanding             PT Long Term Goals - 03/14/15 1138    PT LONG TERM GOAL #1   Title . Patient will report pain level 3/10 max. on NRPS for lower back/ LE with lying down in bed and walking activties    Time 4   Period Weeks   Status New   PT LONG TERM GOAL #2   Title Patient will be independent with home program for self management exercises, pain control and posture awareness    Time 4   Period Weeks   Status New   PT LONG TERM GOAL #3   Title Patient will demonstrate improved perceived disability on Modified Oswestry to 40% or less    Time 4   Period Weeks   Status New               Plan - 03/20/15 1105    Clinical Impression Statement Patient reported decreased left  LE pain on standing and able to transfer into car with moderate difficulty due to increased pain with transfer of weigh to left LE. Patient with increased pain in lower back and left LE since 03/18/15 possibly due to repetitive bending activity with sitting. home program was modified due to increased  symptoms of pain today and patient will now be resitng in side lying every 1-2 hours and use  ice  every 1-2 hours to see if this will control her pain. She  will benefit from additioanl physical therapy intervention to achieve goasl for imparove function with walking with less difficulty and pain.    Pt will benefit from skilled therapeutic intervention in order to improve on the following deficits Difficulty walking;Pain;Increased muscle spasms;Decreased strength   Rehab Potential Good   Clinical Impairments Affecting Rehab Potential chronic condition of back with previous  surgery   PT Frequency 2x / week   PT Duration 4 weeks   PT Treatment/Interventions Manual techniques;Therapeutic exercise;Electrical Stimulation;Cryotherapy;Moist Heat   PT Next Visit Plan Pain control with modalities, progressive exercise and home instruction for posture awareness    Consulted and Agree with Plan of Care Patient        Problem List Patient Active Problem List   Diagnosis Date Noted  . Lumbago 05/08/2014  . Spondylolisthesis of lumbar region 05/05/2014    Aldona Lento 03/20/2015, 11:28 AM  [facility] [address], [place], [address] Phone: [phone]   Fax:  [phone]

## 2015-03-24 ENCOUNTER — Ambulatory Visit: Payer: Medicare Other | Attending: Neurosurgery | Admitting: Physical Therapy

## 2015-03-24 ENCOUNTER — Encounter: Payer: Self-pay | Admitting: Physical Therapy

## 2015-03-24 DIAGNOSIS — M4316 Spondylolisthesis, lumbar region: Secondary | ICD-10-CM | POA: Insufficient documentation

## 2015-03-24 DIAGNOSIS — M545 Low back pain: Secondary | ICD-10-CM | POA: Diagnosis present

## 2015-03-24 DIAGNOSIS — M62830 Muscle spasm of back: Secondary | ICD-10-CM | POA: Diagnosis not present

## 2015-03-24 NOTE — Therapy (Signed)
[facility] [address] [place], [address] Phone: [phone]   Fax:  [phone]  Physical Therapy Treatment  Patient Details  Name: MARSHELLE BILGER MRN: [id number] Date of Birth: February 05, 1934 Referring Provider:  Rusty Aus, MD  Encounter Date: 03/24/2015      PT End of Session - 03/24/15 1013    Visit Number 6   Number of Visits 8   Date for PT Re-Evaluation 04/03/15   Authorization Type 6   Authorization Time Period 10   PT Start Time 0900   PT Stop Time 0940   PT Time Calculation (min) 40 min   Activity Tolerance Patient limited by pain;Patient tolerated treatment well   Behavior During Therapy [facility] for tasks assessed/performed      Past Medical History  Diagnosis Date  . Hypertension   . Anemia   . Sleep apnea     CPAP in use q night   . Pulmonary hypertension     followed by Dr. Raul Del, [facility] Bryant, PFT's done at that last visit- mid April- 2015  . Shortness of breath   . Depression   . Nervous indigestion     uses one TUM, if this occurs (maybe once per month)    . Arthritis     lumbar stenosis  . Neuromuscular disorder     neuropathy hands, feet, legs   . Cancer 2010    ovarian, reoccurence - 2013, treatment completed through porta cath. 2014     Past Surgical History  Procedure Laterality Date  . Total hip arthroplasty  2013  . Knee surgery Left 2006  . Eus  08/20/2012    Procedure: UPPER ENDOSCOPIC ULTRASOUND (EUS) LINEAR;  Surgeon: Milus Banister, MD;  Location: [facility];  Service: Endoscopy;  Laterality: N/A;  radial/ linear patti/ebp   . Joint replacement Right     both knees have been replaced    . Tonsillectomy    . Abdominal hysterectomy    . Back surgery  2003    prior to 2003 episode, had back surgery (lumbar)- 1989  . Laparotomy  2013    for mass in abdomen     There were no vitals filed for this visit.  Visit Diagnosis:  Left low back pain, with  sciatica presence unspecified  Muscle spasm of back      Subjective Assessment - 03/24/15 1000    Subjective Patient reports she is still having pain in lower back left side. She feels that the physical therapy treatments offer temporary relief of her pain. She is today having pain radiating to both hips.Patient reports having X ray of back the other day and is now waiting to have an MRI due to continued pain and questionable areas in back to see where pain  is originating from.    Limitations Sitting;Standing;House hold activities   How long can you sit comfortably? as long as she would like   How long can you stand comfortably? can't stand at all without pain limiting her, pain in sin both hips now and in back   How long can you walk comfortably? not even room to room, arrived in wheelchair   Patient Stated Goals Patient would like to have less back pain to be able to walk further    Currently in Pain? Yes   Pain Score 7    Pain Location Back   Pain Orientation Lower   Pain Descriptors / Indicators Aching;Spasm  Pain Type Acute pain   Pain Frequency Intermittent   Aggravating Factors  walking, standing   Pain Relieving Factors ice, TENS, rest   Effect of Pain on Daily Activities limited in all daily tasks      Objective:   Observation: patient arrived in clinic in wheelchair,Treatment rendered in wheelchair at patient request due to increased pain in back and hips with standing and walking Strength: gross strength tested in both LE's: WFL's both LE's hip flexion, knee flexion/extention, ankle DF and great toe extension Palpation: + spasms and tenderness with increased warmth lower lumbar spine into gluteal region bilaterally    patient response to treatment: Exercises were deferred and concentration was focused on pain control: decreased pain in lower back to 4-5/10 and able to transfer sit to stand with increased weight bearing through left LE, transferred to car with moderate  difficulty (SUV) patient verbalized understanding of modified pain control/activity for home    [facility] Adult PT Treatment/Exercise - 03/20/15 0001    Modalities   Modalities Electrical Stimulation;Cryotherapy   Cryotherapy   Number Minutes Cryotherapy 20 Minutes   Cryotherapy Location Back   Type of Cryotherapy Ice pack   Electrical Stimulation   Electrical Stimulation Location bilateral lumar spine    Electrical Stimulation Action high volt estim. (4) electrodes applied per therapist to lumbar spine paraspinal muslce and upper gluteal region    Electrical Stimulation Parameters continuous mode, intensity to tolerance with patient seated with ice pack applied to back simultanwiously   Electrical Stimulation Goals Pain;Other (comment)  decrease muscle spasms                 PT Education - 03/24/15 1000    Education Details re assessed moving in pain free ranges and the need to move LE's to assist in alleviating symptoms as much as possible   Person(s) Educated Patient   Methods Explanation   Comprehension Verbalized understanding             PT Long Term Goals - 03/14/15 1138    PT LONG TERM GOAL #1   Title . Patient will report pain level 3/10 max. on NRPS for lower back/ LE with lying down in bed and walking activties    Time 4   Period Weeks   Status New   PT LONG TERM GOAL #2   Title Patient will be independent with home program for self management exercises, pain control and posture awareness    Time 4   Period Weeks   Status New   PT LONG TERM GOAL #3   Title Patient will demonstrate improved perceived disability on Modified Oswestry to 40% or less    Time 4   Period Weeks   Status New               Plan - 03/24/15 0930    Clinical Impression Statement Patient reports only tempory rlief of pain in lower back. She is still unable to stand or walk without difficulty due to increased pain in both hips and LE's. She  will benefit from physical therapy intervention to control pain and spasms in lower back/hips while she is waiting for further diagnositc testing (MRI) to determine further course of treatment.    Pt will benefit from skilled therapeutic intervention in order to improve on the following deficits Difficulty walking;Pain;Increased muscle spasms;Decreased strength   Clinical Impairments Affecting Rehab Potential chronic condition of back with previous  surgery   PT Frequency 2x / week  PT Duration 4 weeks   PT Treatment/Interventions Manual techniques;Therapeutic exercise;Electrical Stimulation;Cryotherapy;Moist Heat   PT Next Visit Plan Pain control with modalities, progressive exercise and home instruction for posture awareness    Consulted and Agree with Plan of Care Patient        Problem List Patient Active Problem List   Diagnosis Date Noted  . Lumbago 05/08/2014  . Spondylolisthesis of lumbar region 05/05/2014    Jomarie Longs, PT    03/24/2015, 2:19 PM  [place] [facility] [address] [place], [address] Phone: [phone]   Fax:  [phone]

## 2015-03-27 ENCOUNTER — Encounter: Payer: Self-pay | Admitting: Physical Therapy

## 2015-03-27 ENCOUNTER — Ambulatory Visit: Payer: Medicare Other | Admitting: Physical Therapy

## 2015-03-27 DIAGNOSIS — M62830 Muscle spasm of back: Secondary | ICD-10-CM

## 2015-03-27 DIAGNOSIS — M545 Low back pain: Secondary | ICD-10-CM

## 2015-03-27 NOTE — Therapy (Signed)
[facility] [facility] [address] [place], [address] Phone: [phone]   Fax:  [phone]  Physical Therapy Treatment  Patient Details  Name: Lisa Becker MRN: [id number] Date of Birth: 12-Sep-1934 Referring Provider:  Karie Chimera, MD  Encounter Date: 03/27/2015      PT End of Session - 03/27/15 0917    Visit Number 7   Number of Visits 8   Date for PT Re-Evaluation 04/03/15   Authorization Type 7   Authorization Time Period 10   PT Start Time 0905   PT Stop Time 0940   PT Time Calculation (min) 35 min   Activity Tolerance Patient limited by pain;Patient tolerated treatment well   Behavior During Therapy [facility] for tasks assessed/performed      Past Medical History  Diagnosis Date  . Hypertension   . Anemia   . Sleep apnea     CPAP in use q night   . Pulmonary hypertension     followed by Dr. Raul Del, [facility] Bryant, PFT's done at that last visit- mid April- 2015  . Shortness of breath   . Depression   . Nervous indigestion     uses one TUM, if this occurs (maybe once per month)    . Arthritis     lumbar stenosis  . Neuromuscular disorder     neuropathy hands, feet, legs   . Cancer 2010    ovarian, reoccurence - 2013, treatment completed through porta cath. 2014     Past Surgical History  Procedure Laterality Date  . Total hip arthroplasty  2013  . Knee surgery Left 2006  . Eus  08/20/2012    Procedure: UPPER ENDOSCOPIC ULTRASOUND (EUS) LINEAR;  Surgeon: Milus Banister, MD;  Location: [facility];  Service: Endoscopy;  Laterality: N/A;  radial/ linear patti/ebp   . Joint replacement Right     both knees have been replaced    . Tonsillectomy    . Abdominal hysterectomy    . Back surgery  2003    prior to 2003 episode, had back surgery (lumbar)- 1989  . Laparotomy  2013    for mass in abdomen     There were no vitals filed for this visit.  Visit Diagnosis:  Muscle spasm of back  Left  low back pain, with sciatica presence unspecified      Subjective Assessment - 03/27/15 0907    Subjective Patient reports she is still having pain in lower back left side and right side today. She feels that the physical therapy treatments offer temporary relief of her pain. She is also having pain radiating to both hips.Patient reports she is still waiting to have an MRI due to continued pain and questionable areas in back to see where pain  is originating from.    Limitations Sitting;Standing;House hold activities   How long can you sit comfortably? as long as she would like   How long can you stand comfortably? can't stand at all without pain limiting her, pain is in both hips now and in back   How long can you walk comfortably? She is able to walk with rolator in her home to get to the bathroom only and sometimes has to sit on rolator and push bachwards to get to the bathroom, arrived in wheelchair to PT today   Patient Stated Goals Patient would like to have less back pain to be able to walk further and have the MRI to determine why she  is still in pain   Currently in Pain? Yes   Pain Score 7    Pain Location Back   Pain Orientation Lower   Pain Descriptors / Indicators Aching;Constant;Sharp;Spasm;Guarding   Pain Type Acute pain   Pain Onset 1 to 4 weeks ago   Pain Frequency Constant   Aggravating Factors  walking and standing   Pain Relieving Factors ice, TENS, medication   Effect of Pain on Daily Activities not able to sleep or walk well          [facility] Adult PT Treatment/Exercise - 03/27/15 1119    Modalities   Modalities Cryotherapy;[employer] Location bilateral lumar spine    Electrical Stimulation Parameters continuous mode, intensity to tolerance x 20 min. with patient seated in wheelchair with ice applied to back simultaneously   Electrical Stimulation Goals Pain;Other (comment)  decrease muscle spasms      Patient response to treatment: Patient unable to stand or tolerate any form of exercise today, she verbalized understanding of home exercises given previously and is exercising at home as tolerated and limited by back and hip pain. She tolerated treatment well today for pain control with reports of pain control and reduction of pain from 7/10 to 5/10 and improved ability to transfer from wheelchair to car with minimal assist/standby supervision x 1        PT Education - 03/27/15 0925    Education Details reviewed moving and walking activites within pain limitations and pain control at home including TENS, ice, rest   Person(s) Educated Patient   Methods Explanation   Comprehension Verbalized understanding             PT Long Term Goals - 03/14/15 1138    PT LONG TERM GOAL #1   Title . Patient will report pain level 3/10 max. on NRPS for lower back/ LE with lying down in bed and walking activties    Time 4   Period Weeks   Status New   PT LONG TERM GOAL #2   Title Patient will be independent with home program for self management exercises, pain control and posture awareness    Time 4   Period Weeks   Status New   PT LONG TERM GOAL #3   Title Patient will demonstrate improved perceived disability on Modified Oswestry to 40% or less    Time 4   Period Weeks   Status New               Plan - 03/27/15 0930    Clinical Impression Statement Patient reports temporary relief of pain in lower back. She is stil having constatnt pain and difficulty with sleeping and walking due to the pain in her lower back. She is waiting on MRI to determine further course of treatment.   Pt will benefit from skilled therapeutic intervention in order to improve on the following deficits Pain;Increased muscle spasms;Decreased strength;Difficulty walking   Rehab Potential Good   Clinical Impairments Affecting Rehab Potential chronic condition of back with previous  surgery   PT Frequency 2x /  week   PT Duration 4 weeks   PT Treatment/Interventions Manual techniques;Therapeutic exercise;Electrical Stimulation;Cryotherapy;Moist Heat   PT Next Visit Plan Pain control with modalities, progressive exercise and home instruction for posture awareness    Consulted and Agree with Plan of Care Patient        Problem List Patient Active Problem List   Diagnosis Date Noted  .  Lumbago 05/08/2014  . Spondylolisthesis of lumbar region 05/05/2014   Jomarie Longs, PT  03/27/2015, 11:25 AM  [place] [facility] [address], [place], [address] Phone: [phone]   Fax:  [phone]

## 2015-03-30 ENCOUNTER — Encounter: Payer: Self-pay | Admitting: Emergency Medicine

## 2015-03-30 ENCOUNTER — Ambulatory Visit: Payer: Medicare Other | Admitting: Physical Therapy

## 2015-03-30 ENCOUNTER — Encounter: Payer: Self-pay | Admitting: Physical Therapy

## 2015-03-30 ENCOUNTER — Inpatient Hospital Stay
Admission: EM | Admit: 2015-03-30 | Discharge: 2015-04-05 | DRG: 871 | Disposition: A | Discharge status: Home / Self Care | Payer: Medicare Other | Attending: Internal Medicine | Admitting: Internal Medicine

## 2015-03-30 ENCOUNTER — Emergency Department: Payer: Medicare Other

## 2015-03-30 DIAGNOSIS — G4733 Obstructive sleep apnea (adult) (pediatric): Secondary | ICD-10-CM | POA: Diagnosis present

## 2015-03-30 DIAGNOSIS — M25551 Pain in right hip: Secondary | ICD-10-CM | POA: Diagnosis present

## 2015-03-30 DIAGNOSIS — I10 Essential (primary) hypertension: Secondary | ICD-10-CM | POA: Diagnosis present

## 2015-03-30 DIAGNOSIS — R6521 Severe sepsis with septic shock: Secondary | ICD-10-CM | POA: Diagnosis present

## 2015-03-30 DIAGNOSIS — S3210XA Unspecified fracture of sacrum, initial encounter for closed fracture: Secondary | ICD-10-CM | POA: Diagnosis present

## 2015-03-30 DIAGNOSIS — Z96653 Presence of artificial knee joint, bilateral: Secondary | ICD-10-CM | POA: Diagnosis present

## 2015-03-30 DIAGNOSIS — G4730 Sleep apnea, unspecified: Secondary | ICD-10-CM | POA: Diagnosis present

## 2015-03-30 DIAGNOSIS — G2581 Restless legs syndrome: Secondary | ICD-10-CM | POA: Diagnosis present

## 2015-03-30 DIAGNOSIS — R5082 Postprocedural fever: Secondary | ICD-10-CM

## 2015-03-30 DIAGNOSIS — C569 Malignant neoplasm of unspecified ovary: Secondary | ICD-10-CM | POA: Diagnosis present

## 2015-03-30 DIAGNOSIS — M545 Low back pain, unspecified: Secondary | ICD-10-CM

## 2015-03-30 DIAGNOSIS — J45909 Unspecified asthma, uncomplicated: Secondary | ICD-10-CM | POA: Diagnosis present

## 2015-03-30 DIAGNOSIS — A4151 Sepsis due to Escherichia coli [E. coli]: Secondary | ICD-10-CM | POA: Diagnosis not present

## 2015-03-30 DIAGNOSIS — R5383 Other fatigue: Secondary | ICD-10-CM | POA: Diagnosis present

## 2015-03-30 DIAGNOSIS — Z96649 Presence of unspecified artificial hip joint: Secondary | ICD-10-CM | POA: Diagnosis present

## 2015-03-30 DIAGNOSIS — G629 Polyneuropathy, unspecified: Secondary | ICD-10-CM | POA: Diagnosis present

## 2015-03-30 DIAGNOSIS — I272 Other secondary pulmonary hypertension: Secondary | ICD-10-CM | POA: Diagnosis present

## 2015-03-30 DIAGNOSIS — A419 Sepsis, unspecified organism: Secondary | ICD-10-CM | POA: Diagnosis present

## 2015-03-30 DIAGNOSIS — M62830 Muscle spasm of back: Secondary | ICD-10-CM

## 2015-03-30 DIAGNOSIS — M25552 Pain in left hip: Secondary | ICD-10-CM | POA: Diagnosis present

## 2015-03-30 DIAGNOSIS — X58XXXA Exposure to other specified factors, initial encounter: Secondary | ICD-10-CM | POA: Diagnosis present

## 2015-03-30 DIAGNOSIS — Z79891 Long term (current) use of opiate analgesic: Secondary | ICD-10-CM

## 2015-03-30 DIAGNOSIS — Z66 Do not resuscitate: Secondary | ICD-10-CM | POA: Diagnosis present

## 2015-03-30 DIAGNOSIS — R52 Pain, unspecified: Secondary | ICD-10-CM

## 2015-03-30 DIAGNOSIS — N390 Urinary tract infection, site not specified: Secondary | ICD-10-CM | POA: Diagnosis present

## 2015-03-30 DIAGNOSIS — N10 Acute tubulo-interstitial nephritis: Secondary | ICD-10-CM

## 2015-03-30 HISTORY — DX: Unspecified asthma, uncomplicated: J45.909

## 2015-03-30 MED ORDER — ACETAMINOPHEN 500 MG PO TABS
ORAL_TABLET | ORAL | Status: AC
  Filled 2015-03-30: qty 2

## 2015-03-30 MED ORDER — MORPHINE SULFATE 4 MG/ML IJ SOLN
INTRAMUSCULAR | Status: AC
  Administered 2015-03-30: 4 mg via INTRAVENOUS
  Filled 2015-03-30: qty 1

## 2015-03-30 MED ORDER — SODIUM CHLORIDE 0.9 % IV BOLUS (SEPSIS): 500.0000 mL | Freq: Once | INTRAVENOUS | Status: DC

## 2015-03-30 MED ORDER — ONDANSETRON HCL 4 MG/2ML IJ SOLN
4.0000 mg | Freq: Once | INTRAMUSCULAR | Status: AC
  Administered 2015-03-30: 4 mg via INTRAVENOUS

## 2015-03-30 MED ORDER — ACETAMINOPHEN 500 MG PO TABS
1000.0000 mg | ORAL_TABLET | Freq: Once | ORAL | Status: AC
  Administered 2015-03-30: 1000 mg via ORAL

## 2015-03-30 MED ORDER — SODIUM CHLORIDE 0.9 % IV BOLUS (SEPSIS)
1000.0000 mL | Freq: Once | INTRAVENOUS | Status: AC
  Administered 2015-03-30: 1000 mL via INTRAVENOUS

## 2015-03-30 MED ORDER — MORPHINE SULFATE 4 MG/ML IJ SOLN
4.0000 mg | Freq: Once | INTRAMUSCULAR | Status: AC
  Administered 2015-03-30: 4 mg via INTRAVENOUS

## 2015-03-30 MED ORDER — ONDANSETRON HCL 4 MG/2ML IJ SOLN
INTRAMUSCULAR | Status: AC
  Administered 2015-03-30: 4 mg via INTRAVENOUS
  Filled 2015-03-30: qty 2

## 2015-03-30 NOTE — ED Notes (Signed)
Pt reports pain in lower back and both hips, pt follows with orthopedist visit yesterday, pt reports SOB and called EMS, pt reports taking perscrib  Hydrocodone and flexeril at approx 7pm tonight

## 2015-03-30 NOTE — Therapy (Signed)
[facility] [address] [place], [address] Phone: [phone]   Fax:  [phone]  Physical Therapy Treatment/Discharge summary  Patient Details  Name: Lisa Becker MRN: [id number] Date of Birth: [birth year] Referring Provider:  Rusty Aus, MD  Encounter Date: 03/30/2015 Patient began physical treatment on 03/06/2015 and was seen for 8 treatments. Her last visit was 03/30/2015  She is now being discharged from physical therapy due to lack of progress and continued pain in lower back with MRI and follow up with MD scheduled for further treatment options. Last visit information below:        PT End of Session - 03/30/15 1120    Visit Number 8   Number of Visits 8   Authorization Type 8   Authorization Time Period 10   PT Start Time 1023   PT Stop Time 1110   PT Time Calculation (min) 47 min   Activity Tolerance Patient tolerated treatment well;Patient limited by pain   Behavior During Therapy [facility] for tasks assessed/performed      Past Medical History  Diagnosis Date  . Hypertension   . Anemia   . Sleep apnea     CPAP in use q night   . Pulmonary hypertension     followed by Dr. Raul Del, [facility]ef[facility] Bryant, PFT's done at that last visit- mid April- 2015  . Shortness of breath   . Depression   . Nervous indigestion     uses one TUM, if this occurs (maybe once per month)    . Arthritis     lumbar stenosis  . Neuromuscular disorder     neuropathy hands, feet, legs   . Cancer 2010    ovarian, reoccurence - 2013, treatment completed through porta cath. 2014     Past Surgical History  Procedure Laterality Date  . Total hip arthroplasty  2013  . Knee surgery Left 2006  . Eus  08/20/2012    Procedure: UPPER ENDOSCOPIC ULTRASOUND (EUS) LINEAR;  Surgeon: Milus Banister, MD;  Location: [facility];  Service: Endoscopy;  Laterality: N/A;  radial/ linear patti/ebp   . Joint replacement Right     both  knees have been replaced    . Tonsillectomy    . Abdominal hysterectomy    . Back surgery  2003    prior to 2003 episode, had back surgery (lumbar)- 1989  . Laparotomy  2013    for mass in abdomen     There were no vitals filed for this visit.  Visit Diagnosis:  Muscle spasm of back  Left low back pain, with sciatica presence unspecified      Subjective Assessment - 03/30/15 1037    Subjective Patient reports shehad an injection yesterday in her lower back on left with good results so far. She is still slow with walking but better overall. She is still using a rolator for walking and unable to put full weight through left LE due to pain.   Limitations Sitting   How long can you sit comfortably? as long as she would like   How long can you stand comfortably? stand a few minuts now   How long can you walk comfortably? She is able to walk with rolator in her home to get to the bathroom only and sometimes has to sit on rolator and push bachwards to get to bathroom, arrived in wheelchair to PT today   Patient Stated Goals Patient would like to have less  back pain to be able to walk further and have the MRi to determine why she is still in pain   Currently in Pain? Yes   Pain Score 2    Pain Location Back   Pain Orientation Lower   Pain Descriptors / Indicators Aching;Spasm   Pain Type Acute pain   Pain Onset 1 to 4 weeks ago   Pain Frequency Intermittent   Aggravating Factors  walking and standing   Pain Relieving Factors ice, medication, physical therapy    Effect of Pain on Daily Activities standing and walking activities   Multiple Pain Sites No     Objective: Functional assessment tools: Modified Oswestry: 60% impairment Gait: ambulating using Rolator per patient, She arrived in clinic in wheelchair and is slow to transfer from car to chair using UE support to take pressure off LE's Palpation: + tenderness and spasms localized primarily left lower back into upper gluteal  region. Also has spasms along bilateral lumbar paraspinal muscles       [facility] Adult PT Treatment/Exercise - 03/30/15 1044    Exercises   Exercises Other Exercises  seated stabilization exercises   Other Exercises  hip stabilization in sitting squeeze ball and tighten buttocks x 10 reps with 3 second holds, roll ball under foot x 4# weight on ankle x 15 reps each side, UE stabilization exercises in sitting: single arm row with blue resistive band 2 x 10 each, lat pull downs 2 x 10   Modalities   Modalities Cryotherapy;[employer] Location bilateral lumar spine    Electrical Stimulation Parameters continuous mode, intensity to tolerance x 20 min. with patient seated in wheelchair with ice aplied to back simultaneously   Electrical Stimulation Goals Pain;Other (comment)  decrease muscle spasms     Patient response to treatment: Patient was able to complete exercises with verbal cues and following demonstration, She was able to complete exercises without increased pain noted        PT Education - 03/30/15 1025    Education provided Yes   Education Details Re assessed home program of exercises with patient performing stabilization exercises with therapist guidance and verbal cuing. written instructions given for home.    Person(s) Educated Patient   Methods Explanation;Verbal cues   Comprehension Verbalized understanding;Returned demonstration;Verbal cues required             PT Long Term Goals - 03/30/15 1040    PT LONG TERM GOAL #1   Title . Patient will report pain level 3/10 max. on NRPS for lower back/ LE with lying down in bed and walking activties    Time 4   Period Weeks   Status Not Met   PT LONG TERM GOAL #2   Title Patient will be independent with home program for self management exercises, pain control and posture awareness    Time 4   Period Weeks   Status Achieved   PT LONG TERM GOAL #3   Title  Patient will demonstrate improved perceived disability on Modified Oswestry to 40% or less    Time 4   Period Weeks   Status Not Met               Plan - 03/30/15 1115    Clinical Impression Statement Patient continues with pain that limits her ability to walk, perform daily tasks involving personal care or community activites. She has had minimal to no carryover from treatment  to treatment. She  reports improvement with pain from recent injection and was abel to perform exercises with guidance of therapist. she continues with severe self perceived disability based on modified oswestry , pain scale  and clinical judgment. She agrees to discharge to independent home exercise program as she waits for MRI and further evaluation from MD.    Clinical Impairments Affecting Rehab Potential chronic condition of back with previous  surgery   PT Treatment/Interventions Manual techniques;Therapeutic exercise;Electrical Stimulation;Cryotherapy;Moist Heat   PT Next Visit Plan Recommend patient continue with independent self management for pain and home exercise program. Discharge from physical therapy.    Consulted and Agree with Plan of Care Patient          G-Codes - 04/05/15 1130    Functional Assessment Tool Used modified oswestry, clinical judgment, pain scale    Functional Limitation Mobility: Walking and moving around   Mobility: Walking and Moving Around Current Status [phone]) At least 60 percent but less than 80 percent impaired, limited or restricted   Mobility: Walking and Moving Around Goal Status [phone]) At least 40 percent but less than 60 percent impaired, limited or restricted   Mobility: Walking and Moving Around Discharge Status [phone]) At least 60 percent but less than 80 percent impaired, limited or restricted      Problem List Patient Active Problem List   Diagnosis Date Noted  . Lumbago 05/08/2014  . Spondylolisthesis of lumbar region 05/05/2014   Jomarie Longs,  PT  Apr 05, 2015, 10:22 PM  [facility] [address], [place], [address] Phone: [phone]   Fax:  [phone]

## 2015-03-30 NOTE — ED Provider Notes (Signed)
[facility] Emergency Department Provider Note  ____________________________________________  Time seen: Approximately 2301  I have reviewed the triage vital signs and the nursing notes.   HISTORY  Chief Complaint Back Pain    HPI Lisa Becker is a 81 y.o. female who presents via EMS for shortness of breath and lower back pain and bilateral hip pain. Patient has a extensive history of back problems, status post lumbar fusion several years ago. Yesterday patient received what she describes as a "pain shot in my back" at the pain clinic. Back pain initially improved; however this evening patient is experiencing acute on chronic 8/10 back pain. Patient denies chest pain, abdominal pain, nausea, vomiting, diarrhea, bowel or bladder incontinence, numbness or tingling. Denies cough since injection yesterday. Patient does not wear oxygen but does wear CPAP at night. States she was feeling short of breath tonight when her pain in her back and hips increased.Pain is exacerbated by movement and unrelieved by taking her prescribed hydrocodone and Flexeril at approximately 7 PM.   Past Medical History  Diagnosis Date  . Hypertension   . Anemia   . Sleep apnea     CPAP in use q night   . Pulmonary hypertension     followed by Dr. Raul Del, Jefm Bryant, PFT's done at that last visit- mid April- 2015  . Shortness of breath   . Depression   . Nervous indigestion     uses one TUM, if this occurs (maybe once per month)    . Arthritis     lumbar stenosis  . Neuromuscular disorder     neuropathy hands, feet, legs   . Cancer 2010    ovarian, reoccurence - 2013, treatment completed through porta cath. 2014     Patient Active Problem List   Diagnosis Date Noted  . Sepsis 03/31/2015  . Sacral fracture 03/31/2015  . Ovarian cancer 03/31/2015  . UTI (lower urinary tract infection) 03/31/2015  . Lumbago 05/08/2014  . Spondylolisthesis of lumbar region 05/05/2014    Past  Surgical History  Procedure Laterality Date  . Total hip arthroplasty  2013  . Knee surgery Left 2006  . Eus  08/20/2012    Procedure: UPPER ENDOSCOPIC ULTRASOUND (EUS) LINEAR;  Surgeon: Milus Banister, MD;  Location: [facility] ENDOSCOPY;  Service: Endoscopy;  Laterality: N/A;  radial/ linear   . Joint replacement Right     both knees have been replaced    . Tonsillectomy    . Abdominal hysterectomy    . Back surgery  2003    prior to 2003 episode, had back surgery (lumbar)- 1989  . Laparotomy  2013    for mass in abdomen   . Blepharoplasty      No current outpatient prescriptions on file.  Allergies Amlodipine; Metoclopramide; Metronidazole; and Tramadol hcl er  Family History  Problem Relation Age of Onset  . Breast cancer Sister   . Breast cancer Maternal Aunt     Social History History  Substance Use Topics  . Smoking status: Never Smoker   . Smokeless tobacco: Not on file  . Alcohol Use: No    Review of Systems Constitutional: No fever/chills Eyes: No visual changes. ENT: No sore throat. Cardiovascular: Denies chest pain. Respiratory: Positive for shortness of breath. Gastrointestinal: No abdominal pain.  No nausea, no vomiting.  No diarrhea.  No constipation. Genitourinary: Negative for dysuria. Musculoskeletal: Positive for back pain. Positive for bilateral hip pain. Skin: Negative for rash. Neurological: Negative for headaches, focal weakness  or numbness.  10-point ROS otherwise negative.  ____________________________________________   PHYSICAL EXAM:  VITAL SIGNS: ED Triage Vitals  Enc Vitals Group     BP 03/30/15 2308 145/72 mmHg     Pulse Rate 03/30/15 2308 117     Resp 03/30/15 2308 22     Temp 03/30/15 2308 100.7 F (38.2 C)     Temp Source 03/30/15 2308 Oral     SpO2 03/30/15 2308 97 %     Weight 03/30/15 2308 211 lb 6.7 oz (95.899 kg)     Height 03/30/15 2308 5\' 2"  (1.575 m)     Head Cir --      Peak Flow --      Pain Score 03/30/15 2310 5      Pain Loc --      Pain Edu? --      Excl. in [place]? --     Constitutional: Alert and oriented. Well- nourished and in mild acute distress. Eyes: Conjunctivae are normal. PERRL. EOMI. Head: Atraumatic. Nose: No congestion/rhinnorhea. Mouth/Throat: Mucous membranes are moist.  Oropharynx non-erythematous. Neck: No stridor. Supple neck. Cardiovascular: Tachycardic, regular rhythm. Grossly normal heart sounds.  Good peripheral circulation. Respiratory: Normal respiratory effort.  No retractions. Lungs CTAB. Gastrointestinal: Soft and nontender. No distention. No abdominal bruits. No CVA tenderness. Rectal exam deferred due to patient discomfort. Musculoskeletal: Lumbar spine tender to palpation. Small area of bruising to left paraspinal muscles; presumably site of injection. No fluctuance palpated. No joint effusions bilateral hips. Able to range back and hips with pain. Neurologic:  Normal speech and language. No gross focal neurologic deficits are appreciated. Speech is normal. Gait not tested. Skin:  Skin is hot to touch, dry and intact. No rash noted. Psychiatric: Mood and affect are normal. Speech and behavior are normal.  ____________________________________________   LABS (all labs ordered are listed, but only abnormal results are displayed)  Labs Reviewed  CBC WITH DIFFERENTIAL/PLATELET - Abnormal; Notable for the following:    WBC 20.7 (*)    Hemoglobin 11.8 (*)    Neutrophils Relative % 85 (*)    Lymphocytes Relative 3 (*)    Monocytes Relative 0 (*)    Band Neutrophils 12 (*)    Neutro Abs 20.1 (*)    Lymphs Abs 0.6 (*)    Monocytes Absolute 0.0 (*)    All other components within normal limits  BASIC METABOLIC PANEL - Abnormal; Notable for the following:    Chloride 96 (*)    Glucose, Bld 151 (*)    BUN 38 (*)    Creatinine, Ser 1.18 (*)    GFR calc non Af Amer 43 (*)    GFR calc Af Amer 50 (*)    All other components within normal limits  LACTIC ACID, PLASMA -  Abnormal; Notable for the following:    Lactic Acid, Venous 3.1 (*)    All other components within normal limits  TROPONIN I - Abnormal; Notable for the following:    Troponin I 0.04 (*)    All other components within normal limits  BLOOD GAS, VENOUS - Abnormal; Notable for the following:    pCO2, Ven 42 (*)    Acid-Base Excess 3.2 (*)    All other components within normal limits  URINALYSIS COMPLETEWITH MICROSCOPIC ([facility])  - Abnormal; Notable for the following:    Color, Urine YELLOW (*)    APPearance HAZY (*)    Hgb urine dipstick 2+ (*)    Protein, ur 30 (*)  Leukocytes, UA 2+ (*)    Bacteria, UA MANY (*)    All other components within normal limits  GLUCOSE, CAPILLARY - Abnormal; Notable for the following:    Glucose-Capillary 148 (*)    All other components within normal limits  CULTURE, BLOOD (ROUTINE X 2)  CULTURE, BLOOD (ROUTINE X 2)  URINE CULTURE  TSH  LACTIC ACID, PLASMA  CBC  CREATININE, SERUM   ____________________________________________  EKG  ED ECG REPORT I, SUNG,JADE J, the attending physician, personally viewed and interpreted this ECG.   Date: 03/31/2015  EKG Time: 2301  Rate: 116  Rhythm: sinus tachycardia  Axis: Normal  Intervals:none  ST&T Change: Nonspecific  ____________________________________________  RADIOLOGY  Portable chest x-ray (viewed by me, interpreted by Dr. Marisue Humble): Stable cardiomegaly. No acute pulmonary process.  MRI lumbar spine without contrast per Dr. Dorann Lodge: Motion degraded limited noncontrast MR, patient was unable to complete the noncontrast portion of the examination and, contrast was not administered.  Acute mid sacral fracture incompletely imaged. CT could be performed for further characterization.  Status post L3-4 and L4-5 PLIF, posterior decompression with subsidence, similar grade 1 L3-4 anterolisthesis.  Moderate canal stenosis L2-3 consistent with adjacent segment disease. Neural foraminal  narrowing L2-3 through L5-S1: Moderate to severe appearing bilateral at L5-S1 which may be overestimated by hardware artifact.  ____________________________________________   PROCEDURES  Procedure(s) performed: None  Critical Care performed:CRITICAL CARE Performed by: Paulette Blanch   Total critical care time: 60 minutes  Critical care time was exclusive of separately billable procedures and treating other patients.  Critical care was necessary to treat or prevent imminent or life-threatening deterioration.  Critical care was time spent personally by me on the following activities: development of treatment plan with patient and/or surrogate as well as nursing, discussions with consultants, evaluation of patient's response to treatment, examination of patient, obtaining history from patient or surrogate, ordering and performing treatments and interventions, ordering and review of laboratory studies, ordering and review of radiographic studies, pulse oximetry and re-evaluation of patient's condition.   ____________________________________________   INITIAL IMPRESSION / ASSESSMENT AND PLAN / ED COURSE  Pertinent labs & imaging results that were available during my care of the patient were reviewed by me and considered in my medical decision making (see chart for details).  81 year old female with fever and increased back pain status post back injection yesterday at pain clinic. Cannot find records of procedure; however, presume patient received epidural pain shot. Given fever and increased pain I am concerned for epidural abscess. I have spoken with Dr. Radene Knee from radiology who approves MRI to evaluate epidural abscess. Patient received Tylenol for her rectal temperature of 103.4. Septic workup initiated. Patient remains hemodynamically stable currently.  ----------------------------------------- 1:39 AM on 03/31/2015 -----------------------------------------  Despite being redosed  with analgesia while in MRI, patient was too uncomfortable to lay still for MRI. Nurse was going to give Valium to patient while in MRI; however, patient ultimately refused the rest of her MRI. Patient was able to undergo partial MRI; images will be sent to radiology for review.  Leukocytosis, lactate, troponin, and urinalysis noted. Vancomycin and Zosyn ordered for urosepsis. Anticipate hospital admission pending MRI results.  ----------------------------------------- 2:27 AM on 03/31/2015 -----------------------------------------  Updated patient and daughter on MRI results. Patient denies recent fall/injury.? sacral fracture due to possible pathologic fracture. Patient has been in remission from her ovarian cancer for 1 year. More comfortable after IV Valium. Will discuss case with hospitalist for admission. Currently afebrile, tachycardia improved, remains hemodynamically  stable. ____________________________________________   FINAL CLINICAL IMPRESSION(S) / ED DIAGNOSES  Final diagnoses:  Pain  Post-procedural fever  Sepsis, due to unspecified organism  Acute pyelonephritis  Right-sided low back pain without sciatica  Sacral fracture, closed, initial encounter      Paulette Blanch, MD 03/31/15 [phone]

## 2015-03-31 ENCOUNTER — Encounter: Payer: Self-pay | Admitting: Internal Medicine

## 2015-03-31 ENCOUNTER — Emergency Department: Payer: Medicare Other

## 2015-03-31 ENCOUNTER — Inpatient Hospital Stay: Payer: Medicare Other

## 2015-03-31 DIAGNOSIS — R6521 Severe sepsis with septic shock: Secondary | ICD-10-CM | POA: Diagnosis present

## 2015-03-31 DIAGNOSIS — M25552 Pain in left hip: Secondary | ICD-10-CM | POA: Diagnosis present

## 2015-03-31 DIAGNOSIS — S3210XA Unspecified fracture of sacrum, initial encounter for closed fracture: Secondary | ICD-10-CM | POA: Diagnosis present

## 2015-03-31 DIAGNOSIS — G4733 Obstructive sleep apnea (adult) (pediatric): Secondary | ICD-10-CM | POA: Diagnosis present

## 2015-03-31 DIAGNOSIS — N390 Urinary tract infection, site not specified: Secondary | ICD-10-CM | POA: Diagnosis present

## 2015-03-31 DIAGNOSIS — M8588 Other specified disorders of bone density and structure, other site: Secondary | ICD-10-CM

## 2015-03-31 DIAGNOSIS — I272 Other secondary pulmonary hypertension: Secondary | ICD-10-CM

## 2015-03-31 DIAGNOSIS — M545 Low back pain: Secondary | ICD-10-CM | POA: Diagnosis present

## 2015-03-31 DIAGNOSIS — Z96649 Presence of unspecified artificial hip joint: Secondary | ICD-10-CM | POA: Diagnosis present

## 2015-03-31 DIAGNOSIS — R5383 Other fatigue: Secondary | ICD-10-CM | POA: Diagnosis present

## 2015-03-31 DIAGNOSIS — J45909 Unspecified asthma, uncomplicated: Secondary | ICD-10-CM | POA: Diagnosis present

## 2015-03-31 DIAGNOSIS — M25551 Pain in right hip: Secondary | ICD-10-CM | POA: Diagnosis present

## 2015-03-31 DIAGNOSIS — A419 Sepsis, unspecified organism: Secondary | ICD-10-CM | POA: Diagnosis present

## 2015-03-31 DIAGNOSIS — A4151 Sepsis due to Escherichia coli [E. coli]: Secondary | ICD-10-CM | POA: Diagnosis present

## 2015-03-31 DIAGNOSIS — G4730 Sleep apnea, unspecified: Secondary | ICD-10-CM | POA: Diagnosis present

## 2015-03-31 DIAGNOSIS — N10 Acute tubulo-interstitial nephritis: Secondary | ICD-10-CM | POA: Diagnosis present

## 2015-03-31 DIAGNOSIS — Z66 Do not resuscitate: Secondary | ICD-10-CM | POA: Diagnosis present

## 2015-03-31 DIAGNOSIS — Z8543 Personal history of malignant neoplasm of ovary: Secondary | ICD-10-CM

## 2015-03-31 DIAGNOSIS — C772 Secondary and unspecified malignant neoplasm of intra-abdominal lymph nodes: Secondary | ICD-10-CM | POA: Diagnosis not present

## 2015-03-31 DIAGNOSIS — R52 Pain, unspecified: Secondary | ICD-10-CM | POA: Diagnosis present

## 2015-03-31 DIAGNOSIS — I10 Essential (primary) hypertension: Secondary | ICD-10-CM

## 2015-03-31 DIAGNOSIS — C569 Malignant neoplasm of unspecified ovary: Secondary | ICD-10-CM | POA: Diagnosis present

## 2015-03-31 DIAGNOSIS — Z79891 Long term (current) use of opiate analgesic: Secondary | ICD-10-CM | POA: Diagnosis not present

## 2015-03-31 DIAGNOSIS — R1032 Left lower quadrant pain: Secondary | ICD-10-CM

## 2015-03-31 DIAGNOSIS — G629 Polyneuropathy, unspecified: Secondary | ICD-10-CM | POA: Diagnosis present

## 2015-03-31 DIAGNOSIS — Z79899 Other long term (current) drug therapy: Secondary | ICD-10-CM

## 2015-03-31 DIAGNOSIS — X58XXXA Exposure to other specified factors, initial encounter: Secondary | ICD-10-CM | POA: Diagnosis present

## 2015-03-31 DIAGNOSIS — G2581 Restless legs syndrome: Secondary | ICD-10-CM | POA: Diagnosis present

## 2015-03-31 DIAGNOSIS — Z96653 Presence of artificial knee joint, bilateral: Secondary | ICD-10-CM | POA: Diagnosis present

## 2015-03-31 LAB — URINALYSIS COMPLETE WITH MICROSCOPIC (ARMC ONLY)
Bilirubin Urine: NEGATIVE
GLUCOSE, UA: NEGATIVE mg/dL
KETONES UR: NEGATIVE mg/dL
Nitrite: NEGATIVE
Protein, ur: 30 mg/dL — AB
Specific Gravity, Urine: 1.011 (ref 1.005–1.030)
Squamous Epithelial / LPF: NONE SEEN
pH: 6 (ref 5.0–8.0)

## 2015-03-31 LAB — BASIC METABOLIC PANEL
Anion gap: 14 (ref 5–15)
BUN: 38 mg/dL — AB (ref 6–20)
CO2: 30 mmol/L (ref 22–32)
Calcium: 9 mg/dL (ref 8.9–10.3)
Chloride: 96 mmol/L — ABNORMAL LOW (ref 101–111)
Creatinine, Ser: 1.18 mg/dL — ABNORMAL HIGH (ref 0.44–1.00)
GFR, EST AFRICAN AMERICAN: 50 mL/min — AB (ref >60)
GFR, EST NON AFRICAN AMERICAN: 43 mL/min — AB (ref >60)
Glucose, Bld: 151 mg/dL — ABNORMAL HIGH (ref 65–99)
Potassium: 4.1 mmol/L (ref 3.5–5.1)
SODIUM: 140 mmol/L (ref 135–145)

## 2015-03-31 LAB — CBC WITH DIFFERENTIAL/PLATELET
BAND NEUTROPHILS: 12 % — AB (ref 0–10)
Basophils Absolute: 0 10*3/uL (ref 0.0–0.1)
Basophils Relative: 0 % (ref 0–1)
Blasts: 0 %
EOS ABS: 0 10*3/uL (ref 0.0–0.7)
EOS PCT: 0 % (ref 0–5)
HEMATOCRIT: 36.4 % (ref 35.0–47.0)
Hemoglobin: 11.8 g/dL — ABNORMAL LOW (ref 12.0–16.0)
LYMPHS ABS: 0.6 10*3/uL — AB (ref 0.7–4.0)
Lymphocytes Relative: 3 % — ABNORMAL LOW (ref 12–46)
MCH: 28.5 pg (ref 26.0–34.0)
MCHC: 32.4 g/dL (ref 32.0–36.0)
MCV: 87.9 fL (ref 80.0–100.0)
MONO ABS: 0 10*3/uL — AB (ref 0.1–1.0)
Metamyelocytes Relative: 0 %
Monocytes Relative: 0 % — ABNORMAL LOW (ref 3–12)
Myelocytes: 0 %
NEUTROS ABS: 20.1 10*3/uL — AB (ref 1.7–7.7)
NRBC: 0 /100{WBCs}
Neutrophils Relative %: 85 % — ABNORMAL HIGH (ref 43–77)
OTHER: 0 %
PLATELETS: 189 10*3/uL (ref 150–440)
Promyelocytes Absolute: 0 %
RBC: 4.14 MIL/uL (ref 3.80–5.20)
RDW: 13.9 % (ref 11.5–14.5)
Smear Review: ADEQUATE
WBC: 20.7 10*3/uL — ABNORMAL HIGH (ref 3.6–11.0)

## 2015-03-31 LAB — CBC
HCT: 29.2 % — ABNORMAL LOW (ref 35.0–47.0)
Hemoglobin: 9.5 g/dL — ABNORMAL LOW (ref 12.0–16.0)
MCH: 28.5 pg (ref 26.0–34.0)
MCHC: 32.5 g/dL (ref 32.0–36.0)
MCV: 87.8 fL (ref 80.0–100.0)
Platelets: 193 10*3/uL (ref 150–440)
RBC: 3.33 MIL/uL — ABNORMAL LOW (ref 3.80–5.20)
RDW: 14.2 % (ref 11.5–14.5)
WBC: 30.3 10*3/uL — ABNORMAL HIGH (ref 3.6–11.0)

## 2015-03-31 LAB — BLOOD GAS, VENOUS
Acid-Base Excess: 3.2 mmol/L — ABNORMAL HIGH (ref 0.0–3.0)
Bicarbonate: 27.9 mEq/L (ref 21.0–28.0)
PH VEN: 7.43 (ref 7.320–7.430)
Patient temperature: 37
pCO2, Ven: 42 mmHg — ABNORMAL LOW (ref 44.0–60.0)

## 2015-03-31 LAB — CREATININE, SERUM
Creatinine, Ser: 1.49 mg/dL — ABNORMAL HIGH (ref 0.44–1.00)
GFR calc Af Amer: 38 mL/min — ABNORMAL LOW (ref >60)
GFR calc non Af Amer: 32 mL/min — ABNORMAL LOW (ref >60)

## 2015-03-31 LAB — GLUCOSE, CAPILLARY
Glucose-Capillary: 148 mg/dL — ABNORMAL HIGH (ref 65–99)
Glucose-Capillary: 127 mg/dL — ABNORMAL HIGH (ref 65–99)

## 2015-03-31 LAB — LACTIC ACID, PLASMA
LACTIC ACID, VENOUS: 1.3 mmol/L (ref 0.5–2.0)
Lactic Acid, Venous: 3.1 mmol/L (ref 0.5–2.0)

## 2015-03-31 LAB — TSH: TSH: 2.562 u[IU]/mL (ref 0.350–4.500)

## 2015-03-31 LAB — TROPONIN I: Troponin I: 0.04 ng/mL — ABNORMAL HIGH (ref <0.031)

## 2015-03-31 MED ORDER — VITAMIN C 500 MG PO TABS
1000.0000 mg | ORAL_TABLET | Freq: Every day | ORAL | Status: DC
  Administered 2015-03-31 – 2015-04-05 (×6): 1000 mg via ORAL
  Filled 2015-03-31 (×6): qty 2

## 2015-03-31 MED ORDER — DOCUSATE SODIUM 100 MG PO CAPS: 100.0000 mg | ORAL_CAPSULE | Freq: Two times a day (BID) | ORAL | Status: DC

## 2015-03-31 MED ORDER — SODIUM CHLORIDE 0.9 % IV SOLN
INTRAVENOUS | Status: DC
  Administered 2015-03-31 – 2015-04-02 (×3): via INTRAVENOUS

## 2015-03-31 MED ORDER — VANCOMYCIN HCL IN DEXTROSE 1-5 GM/200ML-% IV SOLN
INTRAVENOUS | Status: AC
  Administered 2015-03-31: 1000 mg via INTRAVENOUS
  Filled 2015-03-31: qty 200

## 2015-03-31 MED ORDER — CITALOPRAM HYDROBROMIDE 20 MG PO TABS
20.0000 mg | ORAL_TABLET | Freq: Every day | ORAL | Status: DC
  Administered 2015-04-01 – 2015-04-05 (×5): 20 mg via ORAL
  Filled 2015-03-31 (×6): qty 1

## 2015-03-31 MED ORDER — CALCIUM-MAGNESIUM 500-250 MG PO TABS
1.0000 | ORAL_TABLET | Freq: Every evening | ORAL | Status: DC
  Filled 2015-03-31: qty 1

## 2015-03-31 MED ORDER — PRAMIPEXOLE DIHYDROCHLORIDE 0.25 MG PO TABS
0.2500 mg | ORAL_TABLET | Freq: Every evening | ORAL | Status: DC | PRN
  Administered 2015-03-31 – 2015-04-03 (×5): 0.25 mg via ORAL
  Filled 2015-03-31 (×5): qty 1

## 2015-03-31 MED ORDER — ACETAMINOPHEN 650 MG RE SUPP: 650.0000 mg | Freq: Four times a day (QID) | RECTAL | Status: DC | PRN

## 2015-03-31 MED ORDER — HEPARIN SODIUM (PORCINE) 5000 UNIT/ML IJ SOLN
5000.0000 [IU] | Freq: Three times a day (TID) | INTRAMUSCULAR | Status: DC
  Administered 2015-03-31 – 2015-04-01 (×3): 5000 [IU] via SUBCUTANEOUS
  Filled 2015-03-31 (×4): qty 1

## 2015-03-31 MED ORDER — ONDANSETRON HCL 4 MG/2ML IJ SOLN
INTRAMUSCULAR | Status: AC
  Administered 2015-03-31: 4 mg via INTRAVENOUS
  Filled 2015-03-31: qty 2

## 2015-03-31 MED ORDER — ACETAMINOPHEN 325 MG PO TABS: 650.0000 mg | ORAL_TABLET | Freq: Four times a day (QID) | ORAL | Status: DC | PRN

## 2015-03-31 MED ORDER — ALBUTEROL SULFATE (2.5 MG/3ML) 0.083% IN NEBU: 3.0000 mL | INHALATION_SOLUTION | Freq: Four times a day (QID) | RESPIRATORY_TRACT | Status: DC | PRN

## 2015-03-31 MED ORDER — IOHEXOL 300 MG/ML  SOLN
80.0000 mL | Freq: Once | INTRAMUSCULAR | Status: AC | PRN
  Administered 2015-03-31: 80 mL via INTRAVENOUS

## 2015-03-31 MED ORDER — PIPERACILLIN-TAZOBACTAM 3.375 G IVPB
3.3750 g | Freq: Once | INTRAVENOUS | Status: AC
Start: 2015-03-31 — End: 2015-03-31
  Administered 2015-03-31: 3.375 g via INTRAVENOUS

## 2015-03-31 MED ORDER — PIPERACILLIN-TAZOBACTAM 3.375 G IVPB
INTRAVENOUS | Status: AC
  Administered 2015-03-31: 3.375 g via INTRAVENOUS
  Filled 2015-03-31: qty 50

## 2015-03-31 MED ORDER — DOCUSATE SODIUM 100 MG PO CAPS
100.0000 mg | ORAL_CAPSULE | Freq: Two times a day (BID) | ORAL | Status: DC
  Administered 2015-04-01 – 2015-04-05 (×5): 100 mg via ORAL
  Filled 2015-03-31 (×10): qty 1

## 2015-03-31 MED ORDER — GABAPENTIN 100 MG PO CAPS
200.0000 mg | ORAL_CAPSULE | Freq: Three times a day (TID) | ORAL | Status: DC
  Administered 2015-03-31 – 2015-04-05 (×16): 200 mg via ORAL
  Filled 2015-03-31 (×17): qty 2

## 2015-03-31 MED ORDER — PIPERACILLIN-TAZOBACTAM 3.375 G IVPB
3.3750 g | Freq: Three times a day (TID) | INTRAVENOUS | Status: DC
  Administered 2015-03-31 – 2015-04-02 (×7): 3.375 g via INTRAVENOUS
  Filled 2015-03-31 (×12): qty 50

## 2015-03-31 MED ORDER — ONDANSETRON HCL 4 MG/2ML IJ SOLN
4.0000 mg | Freq: Once | INTRAMUSCULAR | Status: AC
  Administered 2015-03-31: 4 mg via INTRAVENOUS

## 2015-03-31 MED ORDER — MORPHINE SULFATE 2 MG/ML IJ SOLN
2.0000 mg | INTRAMUSCULAR | Status: DC | PRN
  Administered 2015-04-01 – 2015-04-05 (×8): 2 mg via INTRAVENOUS
  Filled 2015-03-31 (×8): qty 1

## 2015-03-31 MED ORDER — VANCOMYCIN HCL IN DEXTROSE 1-5 GM/200ML-% IV SOLN
1000.0000 mg | Freq: Once | INTRAVENOUS | Status: AC
  Administered 2015-03-31: 1000 mg via INTRAVENOUS

## 2015-03-31 MED ORDER — VITAMIN D 1000 UNITS PO TABS
2000.0000 [IU] | ORAL_TABLET | Freq: Every day | ORAL | Status: DC
  Administered 2015-03-31 – 2015-04-05 (×6): 2000 [IU] via ORAL
  Filled 2015-03-31 (×6): qty 2

## 2015-03-31 MED ORDER — CO-Q 10 OMEGA-3 FISH OIL PO CAPS: 1.0000 | ORAL_CAPSULE | Freq: Two times a day (BID) | ORAL | Status: DC

## 2015-03-31 MED ORDER — MAGNESIUM OXIDE 400 (241.3 MG) MG PO TABS
200.0000 mg | ORAL_TABLET | Freq: Every evening | ORAL | Status: DC
  Administered 2015-03-31 – 2015-04-04 (×5): 200 mg via ORAL
  Filled 2015-03-31 (×4): qty 1

## 2015-03-31 MED ORDER — VANCOMYCIN HCL 10 G IV SOLR
1250.0000 mg | INTRAVENOUS | Status: DC
  Administered 2015-03-31 – 2015-04-01 (×2): 1250 mg via INTRAVENOUS
  Filled 2015-03-31 (×4): qty 1250

## 2015-03-31 MED ORDER — MOMETASONE FURO-FORMOTEROL FUM 100-5 MCG/ACT IN AERO
2.0000 | INHALATION_SPRAY | Freq: Two times a day (BID) | RESPIRATORY_TRACT | Status: DC
  Administered 2015-03-31 – 2015-04-05 (×11): 2 via RESPIRATORY_TRACT
  Filled 2015-03-31: qty 8.8

## 2015-03-31 MED ORDER — IOHEXOL 240 MG/ML SOLN
25.0000 mL | INTRAMUSCULAR | Status: AC
  Administered 2015-03-31 (×2): 25 mL via ORAL

## 2015-03-31 MED ORDER — CALCIUM-MAGNESIUM 500-250 MG PO TABS: 1.0000 | ORAL_TABLET | Freq: Every evening | ORAL | Status: DC

## 2015-03-31 MED ORDER — MORPHINE SULFATE 4 MG/ML IJ SOLN
INTRAMUSCULAR | Status: AC
  Administered 2015-03-31: 4 mg via INTRAVENOUS
  Filled 2015-03-31: qty 1

## 2015-03-31 MED ORDER — DIAZEPAM 5 MG/ML IJ SOLN
INTRAMUSCULAR | Status: AC
  Administered 2015-03-31: 1 mg via INTRAVENOUS
  Filled 2015-03-31: qty 2

## 2015-03-31 MED ORDER — POTASSIUM CHLORIDE CRYS ER 20 MEQ PO TBCR
20.0000 meq | EXTENDED_RELEASE_TABLET | Freq: Every day | ORAL | Status: DC
  Administered 2015-04-01 – 2015-04-05 (×5): 20 meq via ORAL
  Filled 2015-03-31 (×6): qty 1

## 2015-03-31 MED ORDER — MORPHINE SULFATE 4 MG/ML IJ SOLN
4.0000 mg | Freq: Once | INTRAMUSCULAR | Status: AC
  Administered 2015-03-31: 4 mg via INTRAVENOUS

## 2015-03-31 MED ORDER — SIMVASTATIN 20 MG PO TABS
20.0000 mg | ORAL_TABLET | Freq: Every evening | ORAL | Status: DC
  Administered 2015-03-31 – 2015-04-04 (×5): 20 mg via ORAL
  Filled 2015-03-31 (×4): qty 1

## 2015-03-31 MED ORDER — SODIUM CHLORIDE 0.9 % IV BOLUS (SEPSIS)
1000.0000 mL | Freq: Once | INTRAVENOUS | Status: AC
  Administered 2015-03-31: 1000 mL via INTRAVENOUS

## 2015-03-31 MED ORDER — LIPOIC ACID 150 MG PO CAPS: 300.0000 mg | ORAL_CAPSULE | Freq: Every evening | ORAL | Status: DC

## 2015-03-31 MED ORDER — DIAZEPAM 5 MG/ML IJ SOLN
1.0000 mg | Freq: Once | INTRAMUSCULAR | Status: AC
  Administered 2015-03-31: 1 mg via INTRAVENOUS

## 2015-03-31 MED ORDER — TORSEMIDE 20 MG PO TABS
20.0000 mg | ORAL_TABLET | Freq: Every day | ORAL | Status: DC
  Filled 2015-03-31 (×2): qty 1

## 2015-03-31 MED ORDER — VITAMIN B-12 1000 MCG PO TABS
1000.0000 ug | ORAL_TABLET | Freq: Every day | ORAL | Status: DC
  Administered 2015-03-31 – 2015-04-05 (×6): 1000 ug via ORAL
  Filled 2015-03-31 (×6): qty 1

## 2015-03-31 MED ORDER — CALCIUM CARBONATE ANTACID 500 MG PO CHEW
1.0000 | CHEWABLE_TABLET | Freq: Every evening | ORAL | Status: DC
  Administered 2015-03-31 – 2015-04-04 (×5): 200 mg via ORAL
  Filled 2015-03-31 (×4): qty 1

## 2015-03-31 NOTE — Progress Notes (Signed)
Called and advised Dr. Sabra Heck of lab results of blood culture- one anearobic bottle gram (-) rods and urine culture gram (-) rods. MD stated he was aware and also discussed results of abdominal CT. No further order at this time. Buck Mam, RN

## 2015-03-31 NOTE — Consult Note (Signed)
[facility]  Inpatient day:  03/31/2015  Chief Complaint: Lisa Becker is an 81 y.o. female with recurrent ovarian cancer who was admitted on 03/31/2015 with urosepsis.  HPI: The patient has an extensive history of ovarian cancer.  She was diagnosed in 02/2009 with ovarian adenocarcinoma with small bowel involvement. She received 6 cycles of carboplatin and Taxol. She then developed a nodal recurrence in 09/2012.  She underwent resection. She developed a Taxol allergy and thus was switched to carboplatin and gemcitabine for 5 cycles. She was without evidence of disease in 03/2013.  She was last seen in the medical oncology clinic by Dr. Oliva Bustard on 03/08/2015. At that time, PET scan from 09/26/2014 had revealed progressive enlargement of an isolated retroperitoneal node (2.3 x 2 cm; SUV 15).  CA125 was 10.4 on 10/12/2014.  At last visit, she noted pain in her left hip after lifting something heavy. She was undergoing physical therapy. Decision was made to perform a follow-up PET scan in 05/2015.  She notes that about 3 weeks ago she picked up a heavy electric mixer. One or 2 days later, she developed pain in her left lower back. The pain has worsened over time.  She recently underwent spinal injection at a pain management.   In the emergency room she was noted be febrile with significant leukocytosis of. MRI ruled out epidural abscess. She was noted to have an acute sacral fracture. Work-up revealed a urinary tract infection. She was admitted to the intensive care unit on antibiotics.  Past Medical History  Diagnosis Date  . Hypertension   . Anemia   . Sleep apnea     CPAP in use q night   . Pulmonary hypertension     followed by Dr. Raul Del, Jefm Bryant, PFT's done at that last visit- mid April- 2015  . Shortness of breath   . Depression   . Nervous indigestion     uses one TUM, if this occurs (maybe once per month)    . Arthritis     lumbar stenosis  . Neuromuscular  disorder     neuropathy hands, feet, legs   . Cancer 2010    ovarian, reoccurence - 2013, treatment completed through porta cath. 2014   . Asthma     Past Surgical History  Procedure Laterality Date  . Total hip arthroplasty  2013  . Knee surgery Left 2006  . Eus  08/20/2012    Procedure: UPPER ENDOSCOPIC ULTRASOUND (EUS) LINEAR;  Surgeon: Milus Banister, MD;  Location: [facility] ENDOSCOPY;  Service: Endoscopy;  Laterality: N/A;  radial/ linear   . Joint replacement Right     both knees have been replaced    . Tonsillectomy    . Abdominal hysterectomy    . Back surgery  2003    prior to 2003 episode, had back surgery (lumbar)- 1989  . Laparotomy  2013    for mass in abdomen   . Blepharoplasty     Family History  Problem Relation Age of Onset  . Breast cancer Sister   . Breast cancer Maternal Aunt    Social History:  reports that she has never smoked. She does not have any smokeless tobacco history on file. She reports that she does not drink alcohol or use illicit drugs.   She is accompanied by her daughter.  Allergies:  Allergies  Allergen Reactions  . Amlodipine Swelling    "leg swelling"  . Metoclopramide Other (See Comments)    "interferes with my  pramipexole"  . Metronidazole Diarrhea    "upset stomach"  . Tramadol Hcl Er Other (See Comments)    "hold fluid"   Medications Prior to Admission  Medication Sig Dispense Refill  . albuterol (PROVENTIL HFA;VENTOLIN HFA) 108 (90 BASE) MCG/ACT inhaler Inhale 2 puffs into the lungs every 6 (six) hours as needed for wheezing or shortness of breath.    . Alpha-Lipoic Acid (LIPOIC ACID PO) Take 300 mg by mouth every evening.     Marland Kitchen ascorbic acid (VITAMIN C) 1000 MG tablet Take 1,000 mg by mouth daily with breakfast.     . Calcium-Magnesium 500-250 MG TABS Take 1 tablet by mouth every evening.     . Cholecalciferol (VITAMIN D3) 2000 UNITS TABS Take 2,000 Units by mouth daily with breakfast.     . citalopram (CELEXA) 20 MG tablet  Take 20 mg by mouth daily with breakfast.    . Coenzyme Q10-Fish Oil-Vit E (CO-Q 10 OMEGA-3 FISH OIL) CAPS Take 1 capsule by mouth 2 (two) times daily.     Marland Kitchen docusate sodium 100 MG CAPS Take 100 mg by mouth 2 (two) times daily. 60 capsule 0  . fluticasone-salmeterol (ADVAIR HFA) 115-21 MCG/ACT inhaler Inhale 2 puffs into the lungs 2 (two) times daily.    Marland Kitchen gabapentin (NEURONTIN) 100 MG capsule Take 200 mg by mouth 3 (three) times daily.     . Iron-Vitamin C (VITRON-C PO) Take 1 tablet by mouth daily with breakfast.     . OVER THE COUNTER MEDICATION Place 1-2 drops into both eyes 2 (two) times daily. [place] Eye Drops    . oxyCODONE-acetaminophen (PERCOCET) 10-325 MG per tablet Take 1 tablet by mouth every 4 (four) hours as needed for pain. 100 tablet 0  . potassium chloride (MICRO-K) 10 MEQ CR capsule Take 20 mEq by mouth daily with breakfast.     . pramipexole (MIRAPEX) 0.25 MG tablet Take 0.25 mg by mouth at bedtime as needed and may repeat dose one time if needed (restless leg).     . simvastatin (ZOCOR) 20 MG tablet Take 20 mg by mouth every evening.    . torsemide (DEMADEX) 20 MG tablet Take 20 mg by mouth daily with breakfast.     . vitamin B-12 (CYANOCOBALAMIN) 1000 MCG tablet Take 1,000 mcg by mouth daily with breakfast.      Review of Systems:  GENERAL:  Feels better today.  Fever and chills.  No weight loss. PERFORMANCE STATUS (ECOG):  1-2 HEENT:  No visual changes, runny nose, sore throat, mouth sores or tenderness. Lungs: No shortness of breath or cough.  No hemoptysis. Cardiac:  No chest pain, palpitations, orthopnea, or PND. GI:  Lower abdominal discomfort.  No nausea, vomiting, diarrhea, constipation, melena or hematochezia. GU:  Decreased urine output prior to admission.  No urgency, frequency, dysuria, or hematuria. Musculoskeletal:  Chronic back pain.  No joint pain.  No muscle tenderness. Extremities:  No pain or swelling. Skin:  No rashes or skin changes. Neuro:   Neuropathy hands and feet. No headache, numbness or weakness, balance or coordination issues. Endocrine:  No diabetes, thyroid issues, hot flashes or night sweats. Psych:  No mood changes, depression or anxiety. Review of systems:  All other systems reviewed and found to be negative.  Physical Exam:  Blood pressure 108/51, pulse 88, temperature 98.2 F (36.8 C), temperature source Oral, resp. rate 22, height $RemoveBe'5\' 2"'ejpQAPbIg$  (1.575 m), weight 211 lb 6.7 oz (95.899 kg), SpO2 100 %.  GENERAL:  Well developed,  well nourished, sitting comfortably in the ICU in no acute distress. MENTAL STATUS:  Alert and oriented to person, place and time. HEAD:  Short gray hair.  Normocephalic, atraumatic, face symmetric, no Cushingoid features. EYES:  Glasses.  Blue eyes.  Pupils equal round and reactive to light and accomodation.  No conjunctivitis or scleral icterus. ENT:  Oropharynx clear without lesion.  Tongue normal. Mucous membranes moist.  RESPIRATORY:  Clear to auscultation without rales, wheezes or rhonchi. CARDIOVASCULAR:  Regular rate and rhythm without murmur, rub or gallop. ABDOMEN:  Soft, non-tender, with active bowel sounds, and no hepatosplenomegaly.  No masses. SKIN:  No rashes, ulcers or lesions. EXTREMITIES: No edema, no skin discoloration or tenderness.  No palpable cords. LYMPH NODES: No palpable cervical, supraclavicular, axillary or inguinal adenopathy  NEUROLOGICAL: Unremarkable. PSYCH:  Appropriate.  Results for orders placed or performed during the hospital encounter of 03/30/15 (from the past 48 hour(s))  Lactic acid, plasma     Status: Abnormal   Collection Time: 03/30/15 11:31 PM  Result Value Ref Range   Lactic Acid, Venous 3.1 (HH) 0.5 - 2.0 mmol/L    Comment: CRITICAL RESULT CALLED TO, READ BACK BY AND VERIFIED WITH NOEL WEBSTER $RemoveBefo'@[id number]$  03/31/15 BY AJO   TSH     Status: None   Collection Time: 03/30/15 11:31 PM  Result Value Ref Range   TSH 2.562 0.350 - 4.500 uIU/mL  CBC with  Differential     Status: Abnormal   Collection Time: 03/30/15 11:32 PM  Result Value Ref Range   WBC 20.7 (H) 3.6 - 11.0 K/uL   RBC 4.14 3.80 - 5.20 MIL/uL   Hemoglobin 11.8 (L) 12.0 - 16.0 g/dL   HCT 36.4 35.0 - 47.0 %   MCV 87.9 80.0 - 100.0 fL   MCH 28.5 26.0 - 34.0 pg   MCHC 32.4 32.0 - 36.0 g/dL   RDW 13.9 11.5 - 14.5 %   Platelets 189 150 - 440 K/uL   Neutrophils Relative % 85 (H) 43 - 77 %   Lymphocytes Relative 3 (L) 12 - 46 %   Monocytes Relative 0 (L) 3 - 12 %   Eosinophils Relative 0 0 - 5 %   Basophils Relative 0 0 - 1 %   Band Neutrophils 12 (H) 0 - 10 %   Metamyelocytes Relative 0 %   Myelocytes 0 %   Promyelocytes Absolute 0 %   Blasts 0 %   nRBC 0 0 /100 WBC   Other 0 %   Neutro Abs 20.1 (H) 1.7 - 7.7 K/uL   Lymphs Abs 0.6 (L) 0.7 - 4.0 K/uL   Monocytes Absolute 0.0 (L) 0.1 - 1.0 K/uL   Eosinophils Absolute 0.0 0.0 - 0.7 K/uL   Basophils Absolute 0.0 0.0 - 0.1 K/uL   Smear Review      PLATELET CLUMPS NOTED ON SMEAR, COUNT APPEARS ADEQUATE  Basic metabolic panel     Status: Abnormal   Collection Time: 03/30/15 11:32 PM  Result Value Ref Range   Sodium 140 135 - 145 mmol/L   Potassium 4.1 3.5 - 5.1 mmol/L   Chloride 96 (L) 101 - 111 mmol/L   CO2 30 22 - 32 mmol/L   Glucose, Bld 151 (H) 65 - 99 mg/dL   BUN 38 (H) 6 - 20 mg/dL   Creatinine, Ser 1.18 (H) 0.44 - 1.00 mg/dL   Calcium 9.0 8.9 - 10.3 mg/dL   GFR calc non Af Amer 43 (L) >60 mL/min  GFR calc Af Amer 50 (L) >60 mL/min    Comment: (NOTE) The eGFR has been calculated using the CKD EPI equation. This calculation has not been validated in all clinical situations. eGFR's persistently <60 mL/min signify possible Chronic Kidney Disease.    Anion gap 14 5 - 15  Troponin I     Status: Abnormal   Collection Time: 03/30/15 11:32 PM  Result Value Ref Range   Troponin I 0.04 (H) <0.031 ng/mL    Comment: READ BACK AND VERIFIED BY NOEL WEBSTER $RemoveBefo'@0112'[id number]$  03/31/15.Marland KitchenAJO        PERSISTENTLY INCREASED  TROPONIN VALUES IN THE RANGE OF 0.04-0.49 ng/mL CAN BE SEEN IN:       -UNSTABLE ANGINA       -CONGESTIVE HEART FAILURE       -MYOCARDITIS       -CHEST TRAUMA       -ARRYHTHMIAS       -LATE PRESENTING MYOCARDIAL INFARCTION       -COPD   CLINICAL FOLLOW-UP RECOMMENDED.   Culture, blood (routine x 2)     Status: None (Preliminary result)   Collection Time: 03/30/15 11:32 PM  Result Value Ref Range   Specimen Description BLOOD    Special Requests Normal    Culture  Setup Time      GRAM NEGATIVE RODS BLOOD ANAEROBIC BOTTLE IDENTIFICATION TO FOLLOW CRITICAL RESULT CALLED TO, READ BACK BY AND VERIFIED WITH: PAM CRAWFORD ON 03/31/15 AT 1345 BY JEF    Culture GRAM NEGATIVE RODS IDENTIFICATION TO FOLLOW     Report Status PENDING   Urinalysis complete, with microscopic [facility])     Status: Abnormal   Collection Time: 03/31/15 12:13 AM  Result Value Ref Range   Color, Urine YELLOW (A) YELLOW   APPearance HAZY (A) CLEAR   Glucose, UA NEGATIVE NEGATIVE mg/dL   Bilirubin Urine NEGATIVE NEGATIVE   Ketones, ur NEGATIVE NEGATIVE mg/dL   Specific Gravity, Urine 1.011 1.005 - 1.030   Hgb urine dipstick 2+ (A) NEGATIVE   pH 6.0 5.0 - 8.0   Protein, ur 30 (A) NEGATIVE mg/dL   Nitrite NEGATIVE NEGATIVE   Leukocytes, UA 2+ (A) NEGATIVE   RBC / HPF 0-5 0 - 5 RBC/hpf   WBC, UA TOO NUMEROUS TO COUNT 0 - 5 WBC/hpf   Bacteria, UA MANY (A) NONE SEEN   Squamous Epithelial / LPF NONE SEEN NONE SEEN   WBC Clumps PRESENT   Blood gas, venous     Status: Abnormal   Collection Time: 03/31/15  2:39 AM  Result Value Ref Range   pH, Ven 7.43 7.320 - 7.430   pCO2, Ven 42 (L) 44.0 - 60.0 mmHg   Bicarbonate 27.9 21.0 - 28.0 mEq/L   Acid-Base Excess 3.2 (H) 0.0 - 3.0 mmol/L   Patient temperature 37.0    Collection site VENOUS    Sample type VENOUS   Glucose, capillary     Status: Abnormal   Collection Time: 03/31/15  5:37 AM  Result Value Ref Range   Glucose-Capillary 148 (H) 65 - 99 mg/dL  Lactic acid,  plasma     Status: None   Collection Time: 03/31/15  6:39 AM  Result Value Ref Range   Lactic Acid, Venous 1.3 0.5 - 2.0 mmol/L  CBC     Status: Abnormal   Collection Time: 03/31/15  6:39 AM  Result Value Ref Range   WBC 30.3 (H) 3.6 - 11.0 K/uL   RBC 3.33 (L) 3.80 - 5.20 MIL/uL  Hemoglobin 9.5 (L) 12.0 - 16.0 g/dL   HCT 29.2 (L) 35.0 - 47.0 %   MCV 87.8 80.0 - 100.0 fL   MCH 28.5 26.0 - 34.0 pg   MCHC 32.5 32.0 - 36.0 g/dL   RDW 14.2 11.5 - 14.5 %   Platelets 193 150 - 440 K/uL  Creatinine, serum     Status: Abnormal   Collection Time: 03/31/15  6:39 AM  Result Value Ref Range   Creatinine, Ser 1.49 (H) 0.44 - 1.00 mg/dL   GFR calc non Af Amer 32 (L) >60 mL/min   GFR calc Af Amer 38 (L) >60 mL/min    Comment: (NOTE) The eGFR has been calculated using the CKD EPI equation. This calculation has not been validated in all clinical situations. eGFR's persistently <60 mL/min signify possible Chronic Kidney Disease.   Glucose, capillary     Status: Abnormal   Collection Time: 03/31/15  4:10 PM  Result Value Ref Range   Glucose-Capillary 127 (H) 65 - 99 mg/dL   Mr Lumbar Spine Wo Contrast  03/31/2015   CLINICAL DATA:  Low back and bilateral hip pain and radiating to LEFT lower extremity and PET-CT September 26, 2014 for 2 years, worse over last 5 hours. History of ovarian cancer and spinal surgery.  EXAM: MRI LUMBAR SPINE WITHOUT CONTRAST  TECHNIQUE: Multiplanar, multisequence MR imaging of the lumbar spine was performed. No intravenous contrast was administered. Motion degraded examination, patient was unable to tolerate further imaging, axial T1 and postcontrast images not obtained.  COMPARISON:  Lumbar spine radiograph July 13, 2014  FINDINGS: Moderately motion degraded examination.  Using the reference level of the last well-formed intervertebral disc as L5-S1, patient is status post L3-4, L4-5 PLIF, posterior decompression, hardware was present on prior radiograph. Hardware  results in susceptibility artifact. Grade 1 chronic L3-4 anterolisthesis. Severe L4-5 and L5-S1 disc height loss, unchanged consistent with subsidence at L4-5. Moderate L3-4 disc height loss and subsidence. Non surgically altered discs demonstrate mild narrowing at L2-3 and mild desiccation all lumbar levels. Moderate to severe subacute to chronic discogenic endplate changes Q7-6 and L5-S1, moderate at L3-4 and mild at L2-3. No STIR signal abnormality to suggest acute osseous within the lumbar spine. However, low T1, bright T2 STIR signal within the mid sacrum with presacral edema.  Conus medullaris terminates at L1-2. Limited assessment of cauda equina due to moderately motion degraded axial T2 sequence. Severe paraspinal muscle atrophy below the level surgical intervention.  Markedly limited axial T2 sequence, and decreasing level by level evaluation:  L1-2: Moderate broad-based disc bulge, no definite canal stenosis. At least mild LEFT neural foraminal narrowing.  L2-3: Small broad-based disc bulge, severe facet arthropathy and ligamentum flavum redundancy resulting in moderate canal stenosis with minimal neural foraminal narrowing.  L3-4, L4-5: PLIF, posterior decompression. L3-4 anterolisthesis. Moderate broad-based disc bulges L3-4 and L4-5. Suspected moderate to severe LEFT L4-5 neural foraminal narrowing, moderate on the LEFT at L3-4 though motion and hardware artifact limits evaluation.  L5-S1: Probable LEFT laminectomy. Small broad-based disc bulge. Moderate facet arthropathy without canal stenosis. Moderate to severe bilateral neural foraminal narrowing limited by hardware artifact.  IMPRESSION: Motion degraded limited noncontrast MR, patient was unable to complete the noncontrast portion of the examination and, contrast was not administered.  Acute mid sacral fracture incompletely imaged. CT could be performed for further characterization.  Status post L3-4 and L4-5 PLIF, posterior decompression with  subsidence, similar grade 1 L3-4 anterolisthesis.  Moderate canal stenosis L2-3 consistent with  adjacent segment disease. Neural foraminal narrowing L2-3 through L5-S1: Moderate to severe appearing bilateral at L5-S1 which may be overestimated by hardware artifact.   Electronically Signed   By: Elon Alas   On: 03/31/2015 02:04   Ct Abdomen Pelvis W Contrast  03/31/2015   CLINICAL DATA:  Acute generalized abdominal pain.  Sepsis.  EXAM: CT ABDOMEN AND PELVIS WITH CONTRAST  TECHNIQUE: Multidetector CT imaging of the abdomen and pelvis was performed using the standard protocol following bolus administration of intravenous contrast.  CONTRAST:  49mL OMNIPAQUE IOHEXOL 300 MG/ML  SOLN  COMPARISON:  CT scan of January 31, 2014.  FINDINGS: Status post surgical posterior fusion of L3 through L5. Visualized lung bases appear normal.  Mildly distended gallbladder is noted without gallstones or surrounding inflammation. The liver appears normal. Stable splenules are noted. Otherwise the spleen appears normal. Fatty replacement of the pancreas is noted. In the region of the pancreatic head inferiorly, rounded soft tissue nodule measuring 14 x 14 mm is noted, which is slightly increased compared to prior exam. This most likely represents enlarged lymph node.  Adrenal glands appear normal. No hydronephrosis or renal obstruction is noted. Left kidney appears normal. Inflammatory changes are noted around the right kidney suggesting pyelonephritis. No abnormal fluid collection is noted. There is no evidence of bowel obstruction. Urinary bladder appears normal. Status post hysterectomy. Status post left total hip arthroplasty. Ovaries are not well visualized. Atherosclerosis of abdominal aorta is noted without aneurysm formation.  Pre aortic adenopathy measuring 3.4 x 2.7 cm is noted which is significantly increased compared to prior exam. Left periaortic lymph node measuring 19 x 16 mm is noted. 13 x 13 mm portacaval lymph  node is noted which is increased compared to prior exam.  IMPRESSION: Mildly distended gallbladder is noted without gallstones or surrounding inflammation.  Inflammatory changes are noted around the right kidney concerning for pyelonephritis. No hydronephrosis or renal obstruction is noted. No renal or ureteral calculi are noted.  Significantly increased retroperitoneal adenopathy is noted compared to prior exam consistent with metastatic disease.   Electronically Signed   By: Marijo Conception, M.D.   On: 03/31/2015 12:51   Dg Chest Port 1 View  03/31/2015   CLINICAL DATA:  Fever, back pain.  Shortness of breath.  EXAM: PORTABLE CHEST - 1 VIEW  COMPARISON:  PET-CT 09/26/2014, chest radiographs 05/03/2014  FINDINGS: Tip of the left chest port in the mid SVC. Cardiomegaly mediastinal contours are unchanged allowing for differences in technique. No pulmonary edema, confluent airspace disease, pleural effusion or pneumothorax.  IMPRESSION: Stable cardiomegaly.  No acute pulmonary process.   Electronically Signed   By: Jeb Levering M.D.   On: 03/31/2015 00:16   Assessment:  The patient is a 81 y.o. woman with a history of recurrent ovarian cancer (last PET in 09/26/2014 revealed an isolated 2.3 cm retroperitoneal lymph node worrisome for recurrence) admitted with urosepsis.  Imaging studies revealed a sacral fracture.  Patient denies any history of trauma except for pain brought on 3 weeks ago after lifting a heavy electric mixer.  Doubt fracture is pathologic.  Plan:   1)  Anticipate follow-up PET scan in outpatient department to assess status of disease. 2)  Anticipate bone density study in outpatient department (patient notes no bone density study in > 10 years). 3)  Follow-up after discharge with Dr. Oliva Bustard.  Thank you for allowing me to participate in Ms Mikulski's care.    Lequita Asal, MD  03/31/2015, 5:44  PM   

## 2015-03-31 NOTE — Care Management (Signed)
Patient presents from home with complaint of shortness of breath and back pain.  Had a spinal injection 03/30/2015.  Has history of ovarian cancer.  Oncology consult present.  Anticipate patient will transfer out of ICU

## 2015-03-31 NOTE — H&P (Signed)
Lisa Becker is an 81 y.o. female.   Chief Complaint: Chills HPI: Patient presents emergency department complaining of chills and back pain. She recently underwent a spinal injection at pain management due to chronic back pain status post multiple back surgeries. In the emergency department the patient was found to be febrile (MAXIMUM TEMPERATURE 103.9F rectally) with a significant leukocytosis. She underwent MRI of the spine to rule out epidural abscess. No abscess was seen however she was found to have an acute sacral fracture. She was also found to have a urinary tract infection. Her symptoms of sepsis as well as urinary tract infection prompted emergency department to call for admission  Past Medical History  Diagnosis Date  . Hypertension   . Anemia   . Sleep apnea     CPAP in use q night   . Pulmonary hypertension     followed by Dr. Raul Del, Jefm Bryant, PFT's done at that last visit- mid April- 2015  . Shortness of breath   . Depression   . Nervous indigestion     uses one TUM, if this occurs (maybe once per month)    . Arthritis     lumbar stenosis  . Neuromuscular disorder     neuropathy hands, feet, legs   . Cancer 2010    ovarian, reoccurence - 2013, treatment completed through porta cath. 2014     Past Surgical History  Procedure Laterality Date  . Total hip arthroplasty  2013  . Knee surgery Left 2006  . Eus  08/20/2012    Procedure: UPPER ENDOSCOPIC ULTRASOUND (EUS) LINEAR;  Surgeon: Milus Banister, MD;  Location: [facility];  Service: Endoscopy;  Laterality: N/A;  radial/ linear   . Joint replacement Right     both knees have been replaced    . Tonsillectomy    . Abdominal hysterectomy    . Back surgery  2003    prior to 2003 episode, had back surgery (lumbar)- 1989  . Laparotomy  2013    for mass in abdomen   . Blepharoplasty      Family History  Problem Relation Age of Onset  . Breast cancer Sister   . Breast cancer Maternal Aunt    Social History:   reports that she has never smoked. She does not have any smokeless tobacco history on file. She reports that she does not drink alcohol or use illicit drugs.  Allergies:  Allergies  Allergen Reactions  . Amlodipine Swelling    "leg swelling"  . Metoclopramide Other (See Comments)    "interferes with my pramipexole"  . Metronidazole Diarrhea    "upset stomach"  . Tramadol Hcl Er Other (See Comments)    "hold fluid"    Prior to Admission medications   Medication Sig Start Date End Date Taking? Authorizing Provider  albuterol (PROVENTIL HFA;VENTOLIN HFA) 108 (90 BASE) MCG/ACT inhaler Inhale 2 puffs into the lungs every 6 (six) hours as needed for wheezing or shortness of breath.    Historical Provider, MD  Alpha-Lipoic Acid (LIPOIC ACID PO) Take 300 mg by mouth every evening.     Historical Provider, MD  ascorbic acid (VITAMIN C) 1000 MG tablet Take 1,000 mg by mouth daily with breakfast.     Historical Provider, MD  Calcium-Magnesium 500-250 MG TABS Take 1 tablet by mouth every evening.     Historical Provider, MD  Cholecalciferol (VITAMIN D3) 2000 UNITS TABS Take 2,000 Units by mouth daily with breakfast.     Historical Provider, MD  citalopram (CELEXA) 20 MG tablet Take 20 mg by mouth daily with breakfast.    Historical Provider, MD  Coenzyme Q10-Fish Oil-Vit E (CO-Q 10 OMEGA-3 FISH OIL) CAPS Take 1 capsule by mouth 2 (two) times daily.     Historical Provider, MD  docusate sodium 100 MG CAPS Take 100 mg by mouth 2 (two) times daily. 05/07/14   Newman Pies, MD  fluticasone-salmeterol (ADVAIR HFA) [phone] MCG/ACT inhaler Inhale 2 puffs into the lungs 2 (two) times daily.    Historical Provider, MD  gabapentin (NEURONTIN) 100 MG capsule Take 200 mg by mouth 3 (three) times daily.     Historical Provider, MD  Iron-Vitamin C (VITRON-C PO) Take 1 tablet by mouth daily with breakfast.     Historical Provider, MD  OVER THE COUNTER MEDICATION Place 1-2 drops into both eyes 2 (two) times daily.  [place] Eye Drops    Historical Provider, MD  oxyCODONE-acetaminophen (PERCOCET) 10-325 MG per tablet Take 1 tablet by mouth every 4 (four) hours as needed for pain. 05/07/14   Newman Pies, MD  potassium chloride (MICRO-K) 10 MEQ CR capsule Take 20 mEq by mouth daily with breakfast.     Historical Provider, MD  pramipexole (MIRAPEX) 0.25 MG tablet Take 0.25 mg by mouth at bedtime as needed and may repeat dose one time if needed (restless leg).     Historical Provider, MD  simvastatin (ZOCOR) 20 MG tablet Take 20 mg by mouth every evening.    Historical Provider, MD  torsemide (DEMADEX) 20 MG tablet Take 20 mg by mouth daily with breakfast.     Historical Provider, MD  vitamin B-12 (CYANOCOBALAMIN) 1000 MCG tablet Take 1,000 mcg by mouth daily with breakfast.     Historical Provider, MD     Results for orders placed or performed during the hospital encounter of 03/30/15 (from the past 48 hour(s))  Lactic acid, plasma     Status: Abnormal   Collection Time: 03/30/15 11:31 PM  Result Value Ref Range   Lactic Acid, Venous 3.1 (HH) 0.5 - 2.0 mmol/L    Comment: CRITICAL RESULT CALLED TO, READ BACK BY AND VERIFIED WITH NOEL WEBSTER _0  03/31/15 BY AJO   CBC with Differential     Status: Abnormal   Collection Time: 03/30/15 11:32 PM  Result Value Ref Range   WBC 20.7 (H) 3.6 - 11.0 K/uL   RBC 4.14 3.80 - 5.20 MIL/uL   Hemoglobin 11.8 (L) 12.0 - 16.0 g/dL   HCT 36.4 35.0 - 47.0 %   MCV 87.9 80.0 - 100.0 fL   MCH 28.5 26.0 - 34.0 pg   MCHC 32.4 32.0 - 36.0 g/dL   RDW 13.9 11.5 - 14.5 %   Platelets 189 150 - 440 K/uL   Neutrophils Relative % 85 (H) 43 - 77 %   Lymphocytes Relative 3 (L) 12 - 46 %   Monocytes Relative 0 (L) 3 - 12 %   Eosinophils Relative 0 0 - 5 %   Basophils Relative 0 0 - 1 %   Band Neutrophils 12 (H) 0 - 10 %   Metamyelocytes Relative 0 %   Myelocytes 0 %   Promyelocytes Absolute 0 %   Blasts 0 %   nRBC 0 0 /100 WBC   Other 0 %   Neutro Abs 20.1 (H) 1.7 - 7.7  K/uL   Lymphs Abs 0.6 (L) 0.7 - 4.0 K/uL   Monocytes Absolute 0.0 (L) 0.1 - 1.0 K/uL   Eosinophils Absolute 0.0  0.0 - 0.7 K/uL   Basophils Absolute 0.0 0.0 - 0.1 K/uL   Smear Review      PLATELET CLUMPS NOTED ON SMEAR, COUNT APPEARS ADEQUATE  Basic metabolic panel     Status: Abnormal   Collection Time: 03/30/15 11:32 PM  Result Value Ref Range   Sodium 140 135 - 145 mmol/L   Potassium 4.1 3.5 - 5.1 mmol/L   Chloride 96 (L) 101 - 111 mmol/L   CO2 30 22 - 32 mmol/L   Glucose, Bld 151 (H) 65 - 99 mg/dL   BUN 38 (H) 6 - 20 mg/dL   Creatinine, Ser 1.18 (H) 0.44 - 1.00 mg/dL   Calcium 9.0 8.9 - 10.3 mg/dL   GFR calc non Af Amer 43 (L) >60 mL/min   GFR calc Af Amer 50 (L) >60 mL/min    Comment: (NOTE) The eGFR has been calculated using the CKD EPI equation. This calculation has not been validated in all clinical situations. eGFR's persistently <60 mL/min signify possible Chronic Kidney Disease.    Anion gap 14 5 - 15  Troponin I     Status: Abnormal   Collection Time: 03/30/15 11:32 PM  Result Value Ref Range   Troponin I 0.04 (H) <0.031 ng/mL    Comment: READ BACK AND VERIFIED BY NOEL WEBSTER _0  03/31/15.Marland KitchenAJO        PERSISTENTLY INCREASED TROPONIN VALUES IN THE RANGE OF 0.04-0.49 ng/mL CAN BE SEEN IN:       -UNSTABLE ANGINA       -CONGESTIVE HEART FAILURE       -MYOCARDITIS       -CHEST TRAUMA       -ARRYHTHMIAS       -LATE PRESENTING MYOCARDIAL INFARCTION       -COPD   CLINICAL FOLLOW-UP RECOMMENDED.   Urinalysis complete, with microscopic [facility])     Status: Abnormal   Collection Time: 03/31/15 12:13 AM  Result Value Ref Range   Color, Urine YELLOW (A) YELLOW   APPearance HAZY (A) CLEAR   Glucose, UA NEGATIVE NEGATIVE mg/dL   Bilirubin Urine NEGATIVE NEGATIVE   Ketones, ur NEGATIVE NEGATIVE mg/dL   Specific Gravity, Urine 1.011 1.005 - 1.030   Hgb urine dipstick 2+ (A) NEGATIVE   pH 6.0 5.0 - 8.0   Protein, ur 30 (A) NEGATIVE mg/dL   Nitrite NEGATIVE NEGATIVE    Leukocytes, UA 2+ (A) NEGATIVE   RBC / HPF 0-5 0 - 5 RBC/hpf   WBC, UA TOO NUMEROUS TO COUNT 0 - 5 WBC/hpf   Bacteria, UA MANY (A) NONE SEEN   Squamous Epithelial / LPF NONE SEEN NONE SEEN   WBC Clumps PRESENT   Blood gas, venous     Status: Abnormal   Collection Time: 03/31/15  2:39 AM  Result Value Ref Range   pH, Ven 7.43 7.320 - 7.430   pCO2, Ven 42 (L) 44.0 - 60.0 mmHg   Bicarbonate 27.9 21.0 - 28.0 mEq/L   Acid-Base Excess 3.2 (H) 0.0 - 3.0 mmol/L   Patient temperature 37.0    Collection site VENOUS    Sample type VENOUS    Mr Lumbar Spine Wo Contrast  03/31/2015   CLINICAL DATA:  Low back and bilateral hip pain and radiating to LEFT lower extremity and PET-CT September 26, 2014 for 2 years, worse over last 5 hours. History of ovarian cancer and spinal surgery.  EXAM: MRI LUMBAR SPINE WITHOUT CONTRAST  TECHNIQUE: Multiplanar, multisequence MR imaging of the lumbar spine was  performed. No intravenous contrast was administered. Motion degraded examination, patient was unable to tolerate further imaging, axial T1 and postcontrast images not obtained.  COMPARISON:  Lumbar spine radiograph July 13, 2014  FINDINGS: Moderately motion degraded examination.  Using the reference level of the last well-formed intervertebral disc as L5-S1, patient is status post L3-4, L4-5 PLIF, posterior decompression, hardware was present on prior radiograph. Hardware results in susceptibility artifact. Grade 1 chronic L3-4 anterolisthesis. Severe L4-5 and L5-S1 disc height loss, unchanged consistent with subsidence at L4-5. Moderate L3-4 disc height loss and subsidence. Non surgically altered discs demonstrate mild narrowing at L2-3 and mild desiccation all lumbar levels. Moderate to severe subacute to chronic discogenic endplate changes I0-1 and L5-S1, moderate at L3-4 and mild at L2-3. No STIR signal abnormality to suggest acute osseous within the lumbar spine. However, low T1, bright T2 STIR signal within the  mid sacrum with presacral edema.  Conus medullaris terminates at L1-2. Limited assessment of cauda equina due to moderately motion degraded axial T2 sequence. Severe paraspinal muscle atrophy below the level surgical intervention.  Markedly limited axial T2 sequence, and decreasing level by level evaluation:  L1-2: Moderate broad-based disc bulge, no definite canal stenosis. At least mild LEFT neural foraminal narrowing.  L2-3: Small broad-based disc bulge, severe facet arthropathy and ligamentum flavum redundancy resulting in moderate canal stenosis with minimal neural foraminal narrowing.  L3-4, L4-5: PLIF, posterior decompression. L3-4 anterolisthesis. Moderate broad-based disc bulges L3-4 and L4-5. Suspected moderate to severe LEFT L4-5 neural foraminal narrowing, moderate on the LEFT at L3-4 though motion and hardware artifact limits evaluation.  L5-S1: Probable LEFT laminectomy. Small broad-based disc bulge. Moderate facet arthropathy without canal stenosis. Moderate to severe bilateral neural foraminal narrowing limited by hardware artifact.  IMPRESSION: Motion degraded limited noncontrast MR, patient was unable to complete the noncontrast portion of the examination and, contrast was not administered.  Acute mid sacral fracture incompletely imaged. CT could be performed for further characterization.  Status post L3-4 and L4-5 PLIF, posterior decompression with subsidence, similar grade 1 L3-4 anterolisthesis.  Moderate canal stenosis L2-3 consistent with adjacent segment disease. Neural foraminal narrowing L2-3 through L5-S1: Moderate to severe appearing bilateral at L5-S1 which may be overestimated by hardware artifact.   Electronically Signed   By: Elon Alas   On: 03/31/2015 02:04   Dg Chest Port 1 View  03/31/2015   CLINICAL DATA:  Fever, back pain.  Shortness of breath.  EXAM: PORTABLE CHEST - 1 VIEW  COMPARISON:  PET-CT 09/26/2014, chest radiographs 05/03/2014  FINDINGS: Tip of the left chest  port in the mid SVC. Cardiomegaly mediastinal contours are unchanged allowing for differences in technique. No pulmonary edema, confluent airspace disease, pleural effusion or pneumothorax.  IMPRESSION: Stable cardiomegaly.  No acute pulmonary process.   Electronically Signed   By: Jeb Levering M.D.   On: 03/31/2015 00:16    Review of Systems  Constitutional: Positive for fever and chills.  HENT: Negative for sore throat and tinnitus.   Eyes: Negative for blurred vision and redness.  Respiratory: Negative for cough and shortness of breath.   Cardiovascular: Negative for chest pain, palpitations, orthopnea and PND.  Gastrointestinal: Positive for nausea. Negative for vomiting, abdominal pain and diarrhea.  Genitourinary: Positive for urgency. Negative for dysuria and frequency.       Urinary hesitancy  Musculoskeletal: Negative for myalgias and joint pain.  Skin: Negative for rash.       No lesions  Neurological: Negative for speech change, focal  weakness and weakness.  Endo/Heme/Allergies: Does not bruise/bleed easily.       No temperature intolerance  Psychiatric/Behavioral: Negative for depression and suicidal ideas.    Blood pressure 157/109, pulse 107, temperature 103.2 F (39.6 C), temperature source Rectal, resp. rate 18, height _0  (1.575 m), weight 95.899 kg (211 lb 6.7 oz), SpO2 96 %. Physical Exam  Vitals reviewed. Constitutional: She is oriented to person, place, and time. She appears well-developed and well-nourished.  HENT:  Head: Normocephalic and atraumatic.  Eyes: Conjunctivae and EOM are normal. Pupils are equal, round, and reactive to light.  Neck: Normal range of motion. Neck supple. No tracheal deviation present. No thyromegaly present.  Cardiovascular: Normal rate, regular rhythm and normal heart sounds.  Exam reveals no gallop and no friction rub.   No murmur heard. Respiratory: Effort normal and breath sounds normal.  GI: Soft. Bowel sounds are normal.  She exhibits no distension. There is tenderness in the suprapubic area.  Lymphadenopathy:    She has no cervical adenopathy.  Neurological: She is alert and oriented to person, place, and time. No cranial nerve deficit. She exhibits normal muscle tone.  Skin: Skin is warm and dry.  Psychiatric: She has a normal mood and affect. Judgment and thought content normal.     Assessment/Plan This is a 81 year old female admitted for sepsis secondary to urinary tract infection. 1. Sepsis: The patient meets criteria via heart, leukocytosis, and fever. Her blood pressure has been trending down although notably, it was elevated prior to arrival to the hospital. We have obtained blood and urine cultures. Antibiotics have been started in the emergency department.  We will adjust antibiotic coverage per sensitivities. 2. Urinary tract infection: The patient reports urinary hesitancy for quite some time. This may be secondary to worsening splanchnic function due to possible metastatic ovarian cancer. We will likely perform an in- and out catheterization. It remains to be seen if the patient will require an indwelling Foley catheter. Continue vancomycin and Zosyn. 3. Sacral fracture: Acute; likely causing low back pain, although this is a chronic complaint. The patient denies falling recently. There are no lytic lesions seen on MRI, however she may have bony metastases that could be revealed with PET scan. She may be a candidate for Zometa. Calcium is within normal limits. 4. Ovarian cancer: The patient is in her third recurrence but refuses chemotherapy. Likely etiology of sacral fracture. 5. DVT prophylaxis: Heparin. 6. GI prophylaxis: None. The patient is a full code. Time spent on admission orders and critical patient care proximally 45 minutes Harrie Foreman 03/31/2015, 4:16 AM

## 2015-03-31 NOTE — ED Notes (Addendum)
MRI tech called, All questions of pt history answered for [name], Tech. Reports 15 min away

## 2015-03-31 NOTE — ED Notes (Addendum)
Pt back from MRI, pt refused scan, EDP notified

## 2015-03-31 NOTE — ED Notes (Signed)
contact [facility] [place], [phone]

## 2015-03-31 NOTE — Progress Notes (Signed)
Patient transfer per MD order. SR on cardiac monitor, alert and oriented x4. 2L n/c. NS 100 ml/hr. Tolerated meals, good UOP. Transfer to room 114 in bed. Report called to [place], Therapist, sports. Buck Mam, RN

## 2015-03-31 NOTE — ED Notes (Signed)
Family at bedside. 

## 2015-03-31 NOTE — Progress Notes (Signed)
Lisa Becker is a 81 y.o. female   SUBJECTIVE:  Patient presents with progressive left lower quadrant pain, no pain on ambulation, fever, leukocytosis. In the ED her urinalysis was slightly abnormal. Symptoms did come on after an epidural for back pain. This morning she is arousable but slightly lethargic. She reports that her ovarian cancer is stable and followed by oncology.  ______________________________________________________________________  ROS: Review of systems is unremarkable for any active cardiac,respiratory, GI, GU, hematologic, neurologic or psychiatric systems, 10 systems reviewed.  Marland Kitchen acetaminophen      . calcium carbonate  1 tablet Oral QPM  . cholecalciferol  2,000 Units Oral Q breakfast  . citalopram  20 mg Oral Q breakfast  . CO-Q 10 Omega-3 Fish Oil  1 capsule Oral BID  . docusate sodium  100 mg Oral BID  . gabapentin  200 mg Oral TID  . heparin  5,000 Units Subcutaneous 3 times per day  . Lipoic Acid  300 mg Oral QPM  . magnesium oxide  200 mg Oral QPM  . mometasone-formoterol  2 puff Inhalation BID  . potassium chloride  20 mEq Oral Daily  . simvastatin  20 mg Oral QPM  . vitamin B-12  1,000 mcg Oral Q breakfast  . ascorbic acid  1,000 mg Oral Q breakfast   acetaminophen **OR** acetaminophen, albuterol, morphine injection, pramipexole   Past Medical History  Diagnosis Date  . Hypertension   . Anemia   . Sleep apnea     CPAP in use q night   . Pulmonary hypertension     followed by Dr. Raul Del, Jefm Bryant, PFT's done at that last visit- mid April- 2015  . Shortness of breath   . Depression   . Nervous indigestion     uses one TUM, if this occurs (maybe once per month)    . Arthritis     lumbar stenosis  . Neuromuscular disorder     neuropathy hands, feet, legs   . Cancer 2010    ovarian, reoccurence - 2013, treatment completed through porta cath. 2014     Past Surgical History  Procedure Laterality Date  . Total hip arthroplasty  2013  . Knee  surgery Left 2006  . Eus  08/20/2012    Procedure: UPPER ENDOSCOPIC ULTRASOUND (EUS) LINEAR;  Surgeon: Milus Banister, MD;  Location: [facility];  Service: Endoscopy;  Laterality: N/A;  radial/ linear   . Joint replacement Right     both knees have been replaced    . Tonsillectomy    . Abdominal hysterectomy    . Back surgery  2003    prior to 2003 episode, had back surgery (lumbar)- 1989  . Laparotomy  2013    for mass in abdomen   . Blepharoplasty      PHYSICAL EXAM:  BP 112/57 mmHg  Pulse 90  Temp(Src) 98.2 F (36.8 C) (Oral)  Resp 15  Ht 5\' 2"  (1.575 m)  Wt 95.899 kg (211 lb 6.7 oz)  BMI 38.66 kg/m2  SpO2 95%  Wt Readings from Last 3 Encounters:  03/30/15 95.899 kg (211 lb 6.7 oz)  05/10/14 98.884 kg (218 lb)  05/05/14 98.9 kg (218 lb 0.6 oz)           BP Readings from Last 3 Encounters:  03/31/15 112/57  05/12/14 131/54  05/07/14 114/39    Constitutional: NAD Neck: supple, no thyromegaly Respiratory: CTA, no rales or wheezes Cardiovascular: RRR, no murmur, no gallop Abdomen: High-pitched bowel sounds, diffuse moderate bilateral  lower quadrant tenderness without rebound Extremities: no edema Neuro: alert and oriented, no focal motor or sensory deficits  ASSESSMENT/PLAN:  Labs and imaging studies were reviewed  Sepsis-associated SIRS, empiric IV Zosyn/vancomycin. Urinalysis slightly abnormal but abdominal exam is significant for block bilateral lower quadrant tenderness, likely the etiology. CT of the abdomen this morning, continue antibiotics. Hypotension-IV fluid resuscitation, follow renal function Ovarian CA-apparently declined chemotherapy, oncology thoughts about sacral fracture Sacral fracture-seems not to be the predominant issue currently, may need orthopedic thoughts or even biopsy if thought to be pathologic Patient long-term DO NOT RESUSCITATE

## 2015-03-31 NOTE — Progress Notes (Signed)
[facility] NOTE  Pharmacy Consult for Electolyte Management, Vancomycin, & Zosyn  Indication: Sepsis/ICU Status   Allergies  Allergen Reactions  . Amlodipine Swelling    "leg swelling"  . Metoclopramide Other (See Comments)    "interferes with my pramipexole"  . Metronidazole Diarrhea    "upset stomach"  . Tramadol Hcl Er Other (See Comments)    "hold fluid"    Patient Measurements: Height: 5\' 2"  (157.5 cm) Weight: 211 lb 6.7 oz (95.899 kg) IBW/kg (Calculated) : 50.1 Adjusted Body Weight: 70kg  Vital Signs: Temp: 98.2 F (36.8 C) (05/20 0535) Temp Source: Oral (05/20 0535) BP: 108/51 mmHg (05/20 0900) Pulse Rate: 88 (05/20 0900) Intake/Output from previous day: 05/19 0701 - 05/20 0700 In: 100 [I.V.:100] Out: 430 [Urine:430] Intake/Output from this shift: Total I/O In: 200 [I.V.:200] Out: 600 [Urine:600] Vent settings for last 24 hours: Vent Mode:  [-]  FiO2 (%):  [100 %] 100 %  Labs:  Recent Labs  03/30/15 2332 03/31/15 0639  WBC 20.7* 30.3*  HGB 11.8* 9.5*  HCT 36.4 29.2*  PLT 189 193  CREATININE 1.18* 1.49*   Estimated Creatinine Clearance: 33.6 mL/min (by C-G formula based on Cr of 1.49).   Recent Labs  03/31/15 0537  GLUCAP 148*    Microbiology: Recent Results (from the past 720 hour(s))  Culture, blood (routine x 2)     Status: None (Preliminary result)   Collection Time: 03/30/15 11:32 PM  Result Value Ref Range Status   Specimen Description BLOOD  Final   Special Requests Normal  Final   Culture NO GROWTH < 12 HOURS  Final   Report Status PENDING  Incomplete    Medications:  Scheduled:  . calcium carbonate  1 tablet Oral QPM  . cholecalciferol  2,000 Units Oral Q breakfast  . citalopram  20 mg Oral Q breakfast  . docusate sodium  100 mg Oral BID  . gabapentin  200 mg Oral TID  . heparin  5,000 Units Subcutaneous 3 times per day  . magnesium oxide  200 mg Oral QPM  . mometasone-formoterol  2 puff Inhalation  BID  . piperacillin-tazobactam (ZOSYN)  IV  3.375 g Intravenous 3 times per day  . potassium chloride  20 mEq Oral Daily  . simvastatin  20 mg Oral QPM  . vancomycin  1,250 mg Intravenous Q24H  . vitamin B-12  1,000 mcg Oral Q breakfast  . ascorbic acid  1,000 mg Oral Q breakfast   Infusions:  . sodium chloride 100 mL/hr at 03/31/15 0600    Assessment: 81 yo female critical care patient on Zosyn and vancomycin for sepsis.    Plan:   1. Electrolytes - Electrolytes WNL. Will obtain follow-up electrolytes with am labs and adjust accordingly.    2. Zosyn - Will continue patient on Zosyn EI 3.375g IV Q8hr. Will follow cultures and recommend narrowing therapy as appropriate.    3. Vancomycin- Will continue patient on vancomycin 1250mg  IV Q24hr for goal trough of 15-20. Will obtain trough prior to dose on 5/23. Will follow cultures and recommend narrowing therapy as appropriate.    Pharmacy will continue to monitor and adjust per consult.    Randalyn Ahmed L 03/31/2015,1:29 PM

## 2015-03-31 NOTE — ED Notes (Signed)
Patient transported to MRI 

## 2015-03-31 NOTE — ED Notes (Signed)
Abnormal labs reported to Dr Beather Arbour: latic 3.1, Troponin 0.04, per [facility]

## 2015-04-01 LAB — COMPREHENSIVE METABOLIC PANEL
ALT: 18 U/L (ref 14–54)
ANION GAP: 7 (ref 5–15)
AST: 20 U/L (ref 15–41)
Albumin: 2.6 g/dL — ABNORMAL LOW (ref 3.5–5.0)
Alkaline Phosphatase: 158 U/L — ABNORMAL HIGH (ref 38–126)
BUN: 31 mg/dL — ABNORMAL HIGH (ref 6–20)
CHLORIDE: 104 mmol/L (ref 101–111)
CO2: 28 mmol/L (ref 22–32)
Calcium: 7.6 mg/dL — ABNORMAL LOW (ref 8.9–10.3)
Creatinine, Ser: 1.18 mg/dL — ABNORMAL HIGH (ref 0.44–1.00)
GFR calc Af Amer: 50 mL/min — ABNORMAL LOW (ref >60)
GFR calc non Af Amer: 43 mL/min — ABNORMAL LOW (ref >60)
Glucose, Bld: 203 mg/dL — ABNORMAL HIGH (ref 65–99)
Potassium: 3.6 mmol/L (ref 3.5–5.1)
Sodium: 139 mmol/L (ref 135–145)
Total Bilirubin: 0.2 mg/dL — ABNORMAL LOW (ref 0.3–1.2)
Total Protein: 6 g/dL — ABNORMAL LOW (ref 6.5–8.1)

## 2015-04-01 LAB — CBC WITH DIFFERENTIAL/PLATELET
BASOS PCT: 0 %
Basophils Absolute: 0 10*3/uL (ref 0–0.1)
EOS ABS: 0.2 10*3/uL (ref 0–0.7)
EOS PCT: 1 %
HEMATOCRIT: 27.6 % — AB (ref 35.0–47.0)
Hemoglobin: 8.9 g/dL — ABNORMAL LOW (ref 12.0–16.0)
LYMPHS ABS: 1.1 10*3/uL (ref 1.0–3.6)
LYMPHS PCT: 6 %
MCH: 28.6 pg (ref 26.0–34.0)
MCHC: 32.3 g/dL (ref 32.0–36.0)
MCV: 88.5 fL (ref 80.0–100.0)
MONO ABS: 0.7 10*3/uL (ref 0.2–0.9)
Monocytes Relative: 4 %
NEUTROS ABS: 15.1 10*3/uL — AB (ref 1.4–6.5)
Neutrophils Relative %: 89 %
Platelets: 162 10*3/uL (ref 150–440)
RBC: 3.11 MIL/uL — ABNORMAL LOW (ref 3.80–5.20)
RDW: 14.4 % (ref 11.5–14.5)
WBC: 17 10*3/uL — AB (ref 3.6–11.0)

## 2015-04-01 LAB — MAGNESIUM: Magnesium: 2.1 mg/dL (ref 1.7–2.4)

## 2015-04-01 LAB — PHOSPHORUS: Phosphorus: 3.7 mg/dL (ref 2.5–4.6)

## 2015-04-01 MED ORDER — VANCOMYCIN HCL IN DEXTROSE 750-5 MG/150ML-% IV SOLN
750.0000 mg | INTRAVENOUS | Status: DC
  Filled 2015-04-01: qty 150

## 2015-04-01 MED ORDER — VITAMIN D 1000 UNITS PO TABS
1000.0000 [IU] | ORAL_TABLET | Freq: Every day | ORAL | Status: DC
  Administered 2015-04-02 – 2015-04-03 (×2): 1000 [IU] via ORAL
  Filled 2015-04-01 (×2): qty 1

## 2015-04-01 MED ORDER — HYDROCODONE-ACETAMINOPHEN 5-325 MG PO TABS
1.0000 | ORAL_TABLET | ORAL | Status: DC | PRN
  Administered 2015-04-02 – 2015-04-03 (×3): 1 via ORAL
  Filled 2015-04-01 (×4): qty 1

## 2015-04-01 NOTE — Plan of Care (Signed)
Problem: Discharge Progression Outcomes Goal: Discharge plan in place and appropriate Individualization:  Outcome: Progressing Pt prefers to be called Lisa Becker. She is from home with her husband. Hx HTN, depression, sleep apnea, arthritis, neuropathy, and ovarian cancer (tx completed 2014) which are controlled by home medications. Pt is a high fall risk and show understanding of how to use the call system for assistance. Goal: Other Discharge Outcomes/Goals Plan of care progress to goals: 1. Denies pain. 2. Hemodynamically:             -VSS. O2 sat 95% on RA.               -Remains on IVFs.             -Remains on IV antibiotics. Labs with improvement noted.  3. Remains on healthy heart diet. Tolerating diet.  4. Requires one person assist to [facility]. 5. No fall or injuries noted this shift.

## 2015-04-01 NOTE — Plan of Care (Signed)
Problem: Discharge Progression Outcomes Goal: Discharge plan in place and appropriate Individualization: Pt prefers to be called Lisa Becker. She is from home with her husband. Hx HTN, depression, sleep apnea, arthritis, neuropathy, and ovarian cancer (tx completed 2014) which are controlled by home medications. Pt is a high fall risk and show understanding of how to use the call system for assistance. Goal: Other Discharge Outcomes/Goals Plan of care progress to goals: 1. Pain: no c/o pain during the night. PRN Morphine available if needed. 2. Hemodynamically:             -Vitals stable, afebrile, 2L oxygen needed during the night              -IVF continues NS@100              -IV Abx given as scheduled              -WBC elevated; notified of positive blood cultures tonight (aerobic gram - rods) 3. Complications: no evidence of  4. Tolerating diet: pt tolerating heart healthy diet without problem 5. Activity: pt requires moderate assistance out of bed to [facility] due to generalized weakness and chronic back pain No fall or injury this shift. Will continue to assess.

## 2015-04-01 NOTE — Progress Notes (Signed)
Lab reported positive blood cultures: aerobic gram negative rods.  On call MD notified & acknowledged. No new orders received.  Hiram Gash [employer]

## 2015-04-01 NOTE — Progress Notes (Signed)
Lisa Becker is a 81 y.o. female   SUBJECTIVE:  Patient presents with progressive left lower quadrant pain, no pain on ambulation, fever, leukocytosis. In the ED her urinalysis was slightly abnormal. Symptoms did come on after an epidural for back pain. This morning she is arousable but slightly lethargic. She reports that her ovarian cancer is stable and followed by oncology.  ______________________________________________________________________  ROS: Review of systems is unremarkable for any active cardiac,respiratory, GI, GU, hematologic, neurologic or psychiatric systems, 10 systems reviewed.  . calcium carbonate  1 tablet Oral QPM  . cholecalciferol  2,000 Units Oral Q breakfast  . citalopram  20 mg Oral Q breakfast  . docusate sodium  100 mg Oral BID  . gabapentin  200 mg Oral TID  . magnesium oxide  200 mg Oral QPM  . mometasone-formoterol  2 puff Inhalation BID  . piperacillin-tazobactam (ZOSYN)  IV  3.375 g Intravenous 3 times per day  . potassium chloride  20 mEq Oral Daily  . simvastatin  20 mg Oral QPM  . [START ON 04/02/2015] vancomycin  750 mg Intravenous Q18H  . vitamin B-12  1,000 mcg Oral Q breakfast  . ascorbic acid  1,000 mg Oral Q breakfast   acetaminophen **OR** acetaminophen, albuterol, morphine injection, pramipexole   Past Medical History  Diagnosis Date  . Hypertension   . Anemia   . Sleep apnea     CPAP in use q night   . Pulmonary hypertension     followed by Dr. Raul Del, [facility], PFT's done at that last visit- mid April- 2015  . Shortness of breath   . Depression   . Nervous indigestion     uses one TUM, if this occurs (maybe once per month)    . Arthritis     lumbar stenosis  . Neuromuscular disorder     neuropathy hands, feet, legs   . Cancer 2010    ovarian, reoccurence - 2013, treatment completed through porta cath. 2014   . Asthma     Past Surgical History  Procedure Laterality Date  . Total hip arthroplasty  2013  . Knee surgery  Left 2006  . Eus  08/20/2012    Procedure: UPPER ENDOSCOPIC ULTRASOUND (EUS) LINEAR;  Surgeon: Milus Banister, MD;  Location: [facility] ENDOSCOPY;  Service: Endoscopy;  Laterality: N/A;  radial/ linear   . Joint replacement Right     both knees have been replaced    . Tonsillectomy    . Abdominal hysterectomy    . Back surgery  2003    prior to 2003 episode, had back surgery (lumbar)- 1989  . Laparotomy  2013    for mass in abdomen   . Blepharoplasty      PHYSICAL EXAM:  BP 113/51 mmHg  Pulse 62  Temp(Src) 97.9 F (36.6 C) (Oral)  Resp 18  Ht 5\' 2"  (1.575 m)  Wt 95.899 kg (211 lb 6.7 oz)  BMI 38.66 kg/m2  SpO2 99%  Wt Readings from Last 3 Encounters:  03/30/15 95.899 kg (211 lb 6.7 oz)  05/10/14 98.884 kg (218 lb)  05/05/14 98.9 kg (218 lb 0.6 oz)           BP Readings from Last 3 Encounters:  04/01/15 113/51  05/12/14 131/54  05/07/14 114/39    Constitutional: NAD Neck: supple, no thyromegaly Respiratory: CTA, no rales or wheezes Cardiovascular: RRR, no murmur, no gallop Abdomen: High-pitched bowel sounds, diffuse moderate bilateral lower quadrant tenderness without rebound Extremities: no edema Neuro: alert  and oriented, no focal motor or sensory deficits  ASSESSMENT/PLAN:  Labs and imaging studies were reviewed  Sepsis and pyelonephritis; GNR in blood, hold vanc, follow Hypotension- Improving Ovarian CA-apparently declined chemotherapy, oncology is following Sacral fracture- Add vit d, has pet set up as an oupateint soon Patient long-term DO NOT RESUSCITATE

## 2015-04-01 NOTE — Evaluation (Signed)
Physical Therapy Evaluation Patient Details Name: Lisa Becker MRN: [id number] DOB: 12-03-1933 Today's Date: 04/01/2015   History of Present Illness  Pt here with L  lower quadrant pain, feeling generally weak but overall much better  Clinical Impression  Pt is here with abdominal pain that has largely resolved.  She reports feeling weak from being in the hospital as well as feeling much more fatigued with the 75 ft walk than she normally would at baseline.  She shows good effort and her o2 remains in the 90s on room air.      Follow Up Recommendations  (per progress with activity tolerance, may not need HHPT)    Equipment Recommendations       Recommendations for Other Services       Precautions / Restrictions Precautions Precautions: Fall Restrictions Weight Bearing Restrictions: No      Mobility  Bed Mobility               General bed mobility comments: pt in recliner, not tested  Transfers Overall transfer level: Modified independent Equipment used: Rolling walker (2 wheeled)                Ambulation/Gait Ambulation/Gait assistance: Modified independent (Device/Increase time) Ambulation Distance (Feet): 75 Feet Assistive device: Rolling walker (2 wheeled)       General Gait Details: Pt walks with good speed and confidence.  She does have considerable fatigue (reports much more than normal) but her o2 remains in the mid/high 90s t/o on room air  Stairs            Wheelchair Mobility    Modified Rankin (Stroke Patients Only)       Balance                                             Pertinent Vitals/Pain Pain Assessment:  (minimal c/o pain)    Home Living Family/patient expects to be discharged to:: Private residence Living Arrangements: Spouse/significant other (daughter lives next door and helps a lot)   Type of Home: House Home Access: Stairs to enter Entrance Stairs-Rails: Right Entrance Stairs-Number of  Steps: 3          Prior Function Level of Independence: Independent with assistive device(s)         Comments: ambulated with SPC in community and rollator in house      Hand Dominance        Extremity/Trunk Assessment   Upper Extremity Assessment: Overall WFL for tasks assessed           Lower Extremity Assessment: Overall WFL for tasks assessed         Communication      Cognition Arousal/Alertness: Awake/alert Behavior During Therapy: WFL for tasks assessed/performed Overall Cognitive Status: Within Functional Limits for tasks assessed                      General Comments      Exercises        Assessment/Plan    PT Assessment Patient needs continued PT services  PT Diagnosis Generalized weakness   PT Problem List Decreased strength;Decreased activity tolerance  PT Treatment Interventions Gait training;Therapeutic activities;Therapeutic exercise   PT Goals (Current goals can be found in the Care Plan section) Acute Rehab PT Goals Patient Stated Goal: "Get back home as soon as I can"  PT Goal Formulation: With patient Time For Goal Achievement: 04/15/15 Potential to Achieve Goals: Good    Frequency Min 2X/week   Barriers to discharge        Co-evaluation               End of Session Equipment Utilized During Treatment: Gait belt Activity Tolerance: Patient tolerated treatment well             Time: 1511-1531 PT Time Calculation (min) (ACUTE ONLY): 20 min   Charges:   PT Evaluation $Initial PT Evaluation Tier I: 1 Procedure     PT G Codes:       Wayne Both, PT, DPT [phone]  Kreg Shropshire 04/01/2015, 4:47 PM

## 2015-04-01 NOTE — Progress Notes (Signed)
[place] NOTE  Pharmacy Consult for Electolyte Management, Vancomycin, & Zosyn  Indication: Sepsis/ICU Status   Allergies  Allergen Reactions  . Amlodipine Swelling    "leg swelling"  . Metoclopramide Other (See Comments)    "interferes with my pramipexole"  . Metronidazole Diarrhea    "upset stomach"  . Tramadol Hcl Er Other (See Comments)    "hold fluid"    Patient Measurements: Height: 5\' 2"  (157.5 cm) Weight: 211 lb 6.7 oz (95.899 kg) IBW/kg (Calculated) : 50.1 Adjusted Body Weight: 70kg  Vital Signs: Temp: 97.9 F (36.6 C) (05/21 0943) Temp Source: Oral (05/21 0943) BP: 113/51 mmHg (05/21 0943) Pulse Rate: 62 (05/21 0943) Intake/Output from previous day: 05/20 0701 - 05/21 0700 In: 1400 [P.O.:400; I.V.:1000] Out: 1000 [Urine:1000] Intake/Output from this shift: Total I/O In: 240 [P.O.:240] Out: 275 [Urine:275] Vent settings for last 24 hours:    Labs:  Recent Labs  03/30/15 2332 03/31/15 0639 04/01/15 0452  WBC 20.7* 30.3* 17.0*  HGB 11.8* 9.5* 8.9*  HCT 36.4 29.2* 27.6*  PLT 189 193 162  CREATININE 1.18* 1.49* 1.18*  MG  --   --  2.1  PHOS  --   --  3.7  ALBUMIN  --   --  2.6*  PROT  --   --  6.0*  AST  --   --  20  ALT  --   --  18  ALKPHOS  --   --  158*  BILITOT  --   --  0.2*   Estimated Creatinine Clearance: 42.4 mL/min (by C-G formula based on Cr of 1.18).   Recent Labs  03/31/15 0537 03/31/15 1610  GLUCAP 148* 127*    Microbiology: Recent Results (from the past 720 hour(s))  Culture, blood (routine x 2)     Status: None (Preliminary result)   Collection Time: 03/30/15 11:32 PM  Result Value Ref Range Status   Specimen Description BLOOD  Final   Special Requests Normal  Final   Culture  Setup Time   Final    GRAM NEGATIVE RODS BLOOD ANAEROBIC BOTTLE IDENTIFICATION TO FOLLOW CRITICAL RESULT CALLED TO, READ BACK BY AND VERIFIED WITH: PAM CRAWFORD ON 03/31/15 AT 94 BY JEF    Culture   Final    GRAM  NEGATIVE RODS IDENTIFICATION TO FOLLOW SUSCEPTIBILITIES TO FOLLOW    Report Status PENDING  Incomplete  Culture, blood (routine x 2)     Status: None (Preliminary result)   Collection Time: 03/31/15  2:20 AM  Result Value Ref Range Status   Specimen Description BLOOD  Final   Special Requests Normal  Final   Culture  Setup Time   Final    GRAM NEGATIVE RODS AEROBIC BOTTLE ONLY CRITICAL RESULT CALLED TO, READ BACK BY AND VERIFIED WITH: CALLED SKYLER STONE AT [place] ON 04/01/15 BY [facility] CONFIRMED BY [place]    Culture   Final    GRAM NEGATIVE RODS AEROBIC BOTTLE ONLY IDENTIFICATION TO FOLLOW SUSCEPTIBILITIES TO FOLLOW    Report Status PENDING  Incomplete    Medications:  Scheduled:  . calcium carbonate  1 tablet Oral QPM  . cholecalciferol  2,000 Units Oral Q breakfast  . citalopram  20 mg Oral Q breakfast  . docusate sodium  100 mg Oral BID  . gabapentin  200 mg Oral TID  . magnesium oxide  200 mg Oral QPM  . mometasone-formoterol  2 puff Inhalation BID  . piperacillin-tazobactam (ZOSYN)  IV  3.375 g Intravenous  3 times per day  . potassium chloride  20 mEq Oral Daily  . simvastatin  20 mg Oral QPM  . [START ON 04/02/2015] vancomycin  750 mg Intravenous Q18H  . vitamin B-12  1,000 mcg Oral Q breakfast  . ascorbic acid  1,000 mg Oral Q breakfast   Infusions:  . sodium chloride 100 mL/hr at 03/31/15 0600    Assessment: 81 yo female critical care patient on Zosyn and vancomycin for sepsis.    Plan:   1. Electrolytes - Electrolytes WNL. Will obtain follow-up electrolytes with am labs and adjust accordingly.     2. Zosyn - Will continue patient on Zosyn EI 3.375g IV Q8hr. Will follow cultures and recommend narrowing therapy as appropriate.    3. Vancomycin- Scr improved and PK parameters are listed below: Ke= 0.0393; t1/2: 17.6 hrs; VD: 47.8 Regimen changed to Vancomycin 750 mg IV q18hours. Trough level ordered to be drawn prior to the 06:00 dose on 5/24. Target 15-20 mcg/ml.    Pharmacy will continue to monitor and adjust per consult.    Larene Beach, PharmD  04/01/2015,10:26 AM

## 2015-04-02 LAB — URINE CULTURE
Culture: >=100000
Special Requests: NORMAL

## 2015-04-02 MED ORDER — ZOLPIDEM TARTRATE 5 MG PO TABS: 5.0000 mg | ORAL_TABLET | Freq: Every evening | ORAL | Status: DC | PRN

## 2015-04-02 MED ORDER — CEFTRIAXONE SODIUM IN DEXTROSE 20 MG/ML IV SOLN
1.0000 g | Freq: Two times a day (BID) | INTRAVENOUS | Status: DC
  Administered 2015-04-02 – 2015-04-05 (×6): 1 g via INTRAVENOUS
  Filled 2015-04-02 (×8): qty 50

## 2015-04-02 NOTE — Plan of Care (Signed)
Problem: Discharge Progression Outcomes Goal: Discharge plan in place and appropriate Individualization:  Pt prefers to be called Lisa Becker. She is from home with her husband. Hx HTN, depression, sleep apnea, arthritis, neuropathy, and ovarian cancer (tx completed 2014) which are controlled by home medications. Pt is a high fall risk and show understanding of how to use the call system for assistance.

## 2015-04-02 NOTE — Plan of Care (Signed)
Problem: Discharge Progression Outcomes Goal: Other Discharge Outcomes/Goals Outcome: Progressing Plan of care progress to goals: 1. Denies pain. 2. Hemodynamically:             -VSS. O2 sat 95% on RA.                -Remains on IVFs.             -Remains on IV antibiotics. Labs with improvement noted.   3. Remains on healthy heart diet. Tolerating diet.   4. Requires one person assist to [facility]. 5. No fall or injuries noted this shift.

## 2015-04-02 NOTE — Progress Notes (Signed)
Lisa Becker is a 81 y.o. female   SUBJECTIVE:  Patient presents with progressive left lower quadrant pain, no pain on ambulation, fever, leukocytosis and found to have pyelonephritis. Walking some with pt, less pain but has insomnia  ______________________________________________________________________  ROS: Review of systems is unremarkable for any active cardiac,respiratory, GI, GU, hematologic, neurologic or psychiatric systems, 10 systems reviewed.  . calcium carbonate  1 tablet Oral QPM  . cholecalciferol  1,000 Units Oral Daily  . cholecalciferol  2,000 Units Oral Q breakfast  . citalopram  20 mg Oral Q breakfast  . docusate sodium  100 mg Oral BID  . gabapentin  200 mg Oral TID  . magnesium oxide  200 mg Oral QPM  . mometasone-formoterol  2 puff Inhalation BID  . piperacillin-tazobactam (ZOSYN)  IV  3.375 g Intravenous 3 times per day  . potassium chloride  20 mEq Oral Daily  . simvastatin  20 mg Oral QPM  . vitamin B-12  1,000 mcg Oral Q breakfast  . ascorbic acid  1,000 mg Oral Q breakfast   acetaminophen **OR** acetaminophen, albuterol, HYDROcodone-acetaminophen, morphine injection, pramipexole   Past Medical History  Diagnosis Date  . Hypertension   . Anemia   . Sleep apnea     CPAP in use q night   . Pulmonary hypertension     followed by Dr. Raul Del, [facility], PFT's done at that last visit- mid April- 2015  . Shortness of breath   . Depression   . Nervous indigestion     uses one TUM, if this occurs (maybe once per month)    . Arthritis     lumbar stenosis  . Neuromuscular disorder     neuropathy hands, feet, legs   . Cancer 2010    ovarian, reoccurence - 2013, treatment completed through porta cath. 2014   . Asthma     Past Surgical History  Procedure Laterality Date  . Total hip arthroplasty  2013  . Knee surgery Left 2006  . Eus  08/20/2012    Procedure: UPPER ENDOSCOPIC ULTRASOUND (EUS) LINEAR;  Surgeon: Milus Banister, MD;  Location: [facility]  ENDOSCOPY;  Service: Endoscopy;  Laterality: N/A;  radial/ linear   . Joint replacement Right     both knees have been replaced    . Tonsillectomy    . Abdominal hysterectomy    . Back surgery  2003    prior to 2003 episode, had back surgery (lumbar)- 1989  . Laparotomy  2013    for mass in abdomen   . Blepharoplasty      PHYSICAL EXAM:  BP 133/56 mmHg  Pulse 77  Temp(Src) 98.6 F (37 C) (Oral)  Resp 19  Ht 5\' 2"  (1.575 m)  Wt 97.07 kg (214 lb)  BMI 39.13 kg/m2  SpO2 97%  Wt Readings from Last 3 Encounters:  04/02/15 97.07 kg (214 lb)  05/10/14 98.884 kg (218 lb)  05/05/14 98.9 kg (218 lb 0.6 oz)           BP Readings from Last 3 Encounters:  04/02/15 133/56  05/12/14 131/54  05/07/14 114/39    Constitutional: NAD Neck: supple, no thyromegaly Respiratory: CTA, no rales or wheezes Cardiovascular: RRR, no murmur, no gallop Abdomen: High-pitched bowel sounds, diffuse moderate bilateral lower quadrant tenderness without rebound Extremities: no edema Neuro: alert and oriented, no focal motor or sensory deficits  ASSESSMENT/PLAN:  Labs and imaging studies were reviewed  Sepsis and pyelonephritis; GNR in blood  Hypotension-  Better, sl iv  Ovarian CA-apparently declined chemotherapy, oncology is following Sacral fracture- Add vit d, has pet set up as an oupateint soon Patient long-term DO NOT RESUSCITATE

## 2015-04-02 NOTE — Plan of Care (Signed)
Problem: Discharge Progression Outcomes Goal: Discharge plan in place and appropriate Individualization:  Individualization of Care:   Patient prefers to be called Lisa Becker, from home with husband.  PMH: HTN, depression, sleep apnea, arthritis, neuropathy, and ovarian cancer (treatment completed 2014) - controlled by home medications.  High Fall Risk - bed alarm/chair alarm on - patient demonstrated understanding of how to use the call system for assistance.     Goal: Other Discharge Outcomes/Goals Plan of care progress to goal:  Norco x1 and Morphine x1 given for left abdominal pain, relief noted. IV fluids discontinued. IV antibiotics infused as ordered. ONE person assist to [facility].

## 2015-04-03 ENCOUNTER — Encounter: Payer: Medicare Other | Admitting: Physical Therapy

## 2015-04-03 LAB — CBC WITH DIFFERENTIAL/PLATELET
Basophils Absolute: 0 10*3/uL (ref 0–0.1)
Basophils Relative: 0 %
EOS ABS: 0.4 10*3/uL (ref 0–0.7)
HCT: 28.8 % — ABNORMAL LOW (ref 35.0–47.0)
HEMOGLOBIN: 9.5 g/dL — AB (ref 12.0–16.0)
Lymphocytes Relative: 17 %
Lymphs Abs: 1.8 10*3/uL (ref 1.0–3.6)
MCH: 29.2 pg (ref 26.0–34.0)
MCHC: 33 g/dL (ref 32.0–36.0)
MCV: 88.4 fL (ref 80.0–100.0)
MONO ABS: 0.9 10*3/uL (ref 0.2–0.9)
Monocytes Relative: 8 %
Neutro Abs: 7.6 10*3/uL — ABNORMAL HIGH (ref 1.4–6.5)
Neutrophils Relative %: 72 %
PLATELETS: 206 10*3/uL (ref 150–440)
RBC: 3.25 MIL/uL — ABNORMAL LOW (ref 3.80–5.20)
RDW: 14.1 % (ref 11.5–14.5)
WBC: 10.7 10*3/uL (ref 3.6–11.0)

## 2015-04-03 LAB — BASIC METABOLIC PANEL
ANION GAP: 5 (ref 5–15)
BUN: 21 mg/dL — ABNORMAL HIGH (ref 6–20)
CO2: 30 mmol/L (ref 22–32)
Calcium: 8.1 mg/dL — ABNORMAL LOW (ref 8.9–10.3)
Chloride: 102 mmol/L (ref 101–111)
Creatinine, Ser: 0.91 mg/dL (ref 0.44–1.00)
GFR calc Af Amer: >60 mL/min (ref >60)
GFR calc non Af Amer: 59 mL/min — ABNORMAL LOW (ref >60)
GLUCOSE: 96 mg/dL (ref 65–99)
Potassium: 4.2 mmol/L (ref 3.5–5.1)
Sodium: 137 mmol/L (ref 135–145)

## 2015-04-03 MED ORDER — PRAMIPEXOLE DIHYDROCHLORIDE 0.25 MG PO TABS
0.2500 mg | ORAL_TABLET | Freq: Two times a day (BID) | ORAL | Status: DC
  Administered 2015-04-03 – 2015-04-05 (×5): 0.25 mg via ORAL
  Filled 2015-04-03 (×5): qty 1

## 2015-04-03 MED ORDER — HYDROCODONE-ACETAMINOPHEN 5-325 MG PO TABS
1.0000 | ORAL_TABLET | Freq: Two times a day (BID) | ORAL | Status: DC
  Administered 2015-04-03 – 2015-04-05 (×5): 1 via ORAL
  Filled 2015-04-03 (×5): qty 1

## 2015-04-03 NOTE — Care Management Note (Signed)
Case Management Note  Patient Details  Name: Lisa Becker MRN: [id number] Date of Birth: 03-16-1934  Subjective/Objective:                    Action/Plan:   Expected Discharge Date:                  Expected Discharge Plan:     In-House Referral:     Discharge planning Services     Post Acute Care Choice:    Choice offered to:     DME Arranged:    DME Agency:     HH Arranged:    [place] Agency:     Status of Service:     Medicare Important Message Given:   Yes Date Medicare IM Given:    04/03/15 Medicare IM give by:   Hester Mates, RN Date Additional Medicare IM Given:    Additional Medicare Important Message give by:     If discussed at [place] of Stay Meetings, dates discussed:  04/03/15  Additional Comments:  Wilmer Santillo A, RN 04/03/2015, 8:44 AM

## 2015-04-03 NOTE — Progress Notes (Signed)
Physical Therapy Treatment Patient Details Name: Lisa Becker MRN: [id number] DOB: 1934-11-08 Today's Date: 04/03/2015    History of Present Illness Pt here with L  lower quadrant pain, feeling generally weak but overall much better    PT Comments    Pt is not making improvement towards goals this session and is limited by fatigue. Pt reports 6/10 on RPE with minimal exertion. PT ambulated with min assist and rw. Pt with pre-syncopal event during gait training, requiring WC and RN assistance. Vitals assessed, WNL. Pt with labored breathing. Pt returned back to bed and unable to further participate with therapy. CSW notified with updated POC.  Follow Up Recommendations  SNF     Equipment Recommendations       Recommendations for Other Services       Precautions / Restrictions Precautions Precautions: Fall Restrictions Weight Bearing Restrictions: No    Mobility  Bed Mobility Overal bed mobility: Needs Assistance Bed Mobility: Sit to Supine           General bed mobility comments: sit->supine with mod assist for B LEs. Pt very short winded with minimal exertion  Transfers Overall transfer level: Needs assistance Equipment used: Rolling walker (2 wheeled) Transfers: Sit to/from Stand Sit to Stand: Min assist         General transfer comment: sit<>Stand with rw and min assist. Once standing, pt requires cues for anterior translation of weight as she demonstrates posterior leaning.  Ambulation/Gait Ambulation/Gait assistance: Min assist Ambulation Distance (Feet): 40 Feet Assistive device: Rolling walker (2 wheeled)       General Gait Details: Pt ambulates with reciprocal gait pattern, however fatigues quickly with heavy SOB symptoms. Pt with 2 standing rest breaks and eventually feels faint and requires a WC to sit in. All vital WNL and assessed by PT and RN.   Stairs            Wheelchair Mobility    Modified Rankin (Stroke Patients Only)        Balance                                    Cognition Arousal/Alertness: Awake/alert Behavior During Therapy: WFL for tasks assessed/performed Overall Cognitive Status: Within Functional Limits for tasks assessed                      Exercises      General Comments        Pertinent Vitals/Pain Pain Assessment: No/denies pain    Home Living                      Prior Function            PT Goals (current goals can now be found in the care plan section) Acute Rehab PT Goals Patient Stated Goal: "Get back home as soon as I can" PT Goal Formulation: With patient Time For Goal Achievement: 04/15/15 Potential to Achieve Goals: Good Progress towards PT goals: Not progressing toward goals - comment (limited by fatigue)    Frequency  Min 2X/week    PT Plan Discharge plan needs to be updated    Co-evaluation             End of Session Equipment Utilized During Treatment: Gait belt Activity Tolerance: Patient limited by fatigue Patient left: in bed;with bed alarm set     Time: 1448-1856 PT  Time Calculation (min) (ACUTE ONLY): 30 min  Charges:  $Gait Training: 23-37 mins                    G Codes:     Greggory Stallion, PT, DPT [phone]  Shemuel Harkleroad 04/03/2015, 4:41 PM

## 2015-04-03 NOTE — Clinical Documentation Improvement (Signed)
Presents with Sepsis likely due to UTI. Hypotension documented.   BP's of 52/41 with a MAP of 47, 96/54 with MAP of 65 noted on flow sheets  Fluid resuscitation given; IV antibiotics initiated  Please provide a diagnosis associated with the above clinical indicators and document findings in next progress note and include in discharge summary if applicable.  Septic Shock Cardiogenic Shock Hypovolemic Shock Other Condition Cannot Clinically determine  Thank You, Zoila Shutter ,RN Clinical Documentation Specialist:  [place] Information Management

## 2015-04-03 NOTE — Plan of Care (Signed)
Problem: Discharge Progression Outcomes Goal: Discharge plan in place and appropriate Individualization:  Pt prefers to be called Lisa Becker. She is from home with her husband. Hx HTN, depression, sleep apnea, arthritis, neuropathy, and ovarian cancer (tx completed 2014) which are controlled by home medications. Pt is a high fall risk and shows understanding of how to use the call system for assistance.    Goal: Other Discharge Outcomes/Goals Plan of care progress to goals: 1. Pain: lower left abdominal pain relieved by PRN Oxy IR; restless leg pain relieved by Mirapex 2. Hemodynamically:             -Vitals stable, afebrile               -IV Abx given as scheduled               -WBC elevated but trending down 4. Tolerating diet: pt tolerating heart healthy diet without problem 5. Activity: pt requires moderate assistance out of bed to [facility] due to generalized weakness and chronic back pain No fall or injury this shift. Will continue to assess.

## 2015-04-03 NOTE — Plan of Care (Signed)
Problem: Discharge Progression Outcomes Goal: Discharge plan in place and appropriate Individualization:  Outcome: Progressing Pt prefers to be called Pamala Hurry. She is from home with her husband. Hx HTN, depression, sleep apnea, arthritis, neuropathy, and ovarian cancer (tx completed 2014) which are controlled by home medications. Pt is a high fall risk and shows understanding of how to use the call system for assistance. Goal: Other Discharge Outcomes/Goals Outcome: Progressing Plan of care progress to goals: 1. Pain: restless leg pain relieved by Mirapex, norco/vicodin now BID. 2. Hemodynamically:             -Vitals stable, afebrile                -IV Abx given as scheduled                -WBC elevated but trending down 4. Tolerating diet: pt tolerating heart healthy diet  5. Activity: pt requires moderate assistance out of bed to [facility] due to generalized weakness and chronic back pain. Pt felt faint when walking with PT, sat down, BP okay, Pt recovered well after. Will monitor. No fall or injury this shift. Will continue to assess

## 2015-04-03 NOTE — Progress Notes (Signed)
Lisa Becker is a 81 y.o. female   SUBJECTIVE:  Patient doing a lot better. E coli in urine and blood. Moderate back pain.  ______________________________________________________________________  ROS: Review of systems is unremarkable for any active cardiac,respiratory, GI, GU, hematologic, neurologic or psychiatric systems, 10 systems reviewed.  . calcium carbonate  1 tablet Oral QPM  . cefTRIAXone (ROCEPHIN)  IV  1 g Intravenous Q12H  . cholecalciferol  1,000 Units Oral Daily  . cholecalciferol  2,000 Units Oral Q breakfast  . citalopram  20 mg Oral Q breakfast  . docusate sodium  100 mg Oral BID  . gabapentin  200 mg Oral TID  . HYDROcodone-acetaminophen  1 tablet Oral BID  . magnesium oxide  200 mg Oral QPM  . mometasone-formoterol  2 puff Inhalation BID  . potassium chloride  20 mEq Oral Daily  . pramipexole  0.25 mg Oral BID  . simvastatin  20 mg Oral QPM  . vitamin B-12  1,000 mcg Oral Q breakfast  . ascorbic acid  1,000 mg Oral Q breakfast   acetaminophen **OR** acetaminophen, albuterol, morphine injection, zolpidem   Past Medical History  Diagnosis Date  . Hypertension   . Anemia   . Sleep apnea     CPAP in use q night   . Pulmonary hypertension     followed by Dr. Raul Del, Jefm Bryant, PFT's done at that last visit- mid April- 2015  . Shortness of breath   . Depression   . Nervous indigestion     uses one TUM, if this occurs (maybe once per month)    . Arthritis     lumbar stenosis  . Neuromuscular disorder     neuropathy hands, feet, legs   . Cancer 2010    ovarian, reoccurence - 2013, treatment completed through porta cath. 2014   . Asthma     Past Surgical History  Procedure Laterality Date  . Total hip arthroplasty  2013  . Knee surgery Left 2006  . Eus  08/20/2012    Procedure: UPPER ENDOSCOPIC ULTRASOUND (EUS) LINEAR;  Surgeon: Milus Banister, MD;  Location: [facility];  Service: Endoscopy;  Laterality: N/A;  radial/ linear   . Joint  replacement Right     both knees have been replaced    . Tonsillectomy    . Abdominal hysterectomy    . Back surgery  2003    prior to 2003 episode, had back surgery (lumbar)- 1989  . Laparotomy  2013    for mass in abdomen   . Blepharoplasty      PHYSICAL EXAM:  BP 118/65 mmHg  Pulse 89  Temp(Src) 97.6 F (36.4 C) (Oral)  Resp 19  Ht 5\' 2"  (1.575 m)  Wt 97.523 kg (215 lb)  BMI 39.31 kg/m2  SpO2 98%  Wt Readings from Last 3 Encounters:  04/03/15 97.523 kg (215 lb)  05/10/14 98.884 kg (218 lb)  05/05/14 98.9 kg (218 lb 0.6 oz)           BP Readings from Last 3 Encounters:  04/03/15 118/65  05/12/14 131/54  05/07/14 114/39    Constitutional: NAD Neck: supple, no thyromegaly Respiratory: CTA, no rales or wheezes Cardiovascular: RRR, no murmur, no gallop Abdomen: soft, good BS, nontender Extremities: no edema Neuro: alert and oriented, no focal motor or sensory deficits  ASSESSMENT/PLAN:  Labs and imaging studies were reviewed  Escherichia coli sepsis-blood pressure better, continue IV Rocephin, off Zosyn Sacral fracture-no sign for pathologic fracture, Norco twice a day, morphine  when necessary Ovarian CAD-oncology follow-up ROS-Mirapex twice a day Home a.m. with physical therapy

## 2015-04-04 LAB — BASIC METABOLIC PANEL
ANION GAP: 8 (ref 5–15)
BUN: 21 mg/dL — AB (ref 6–20)
CALCIUM: 8.4 mg/dL — AB (ref 8.9–10.3)
CHLORIDE: 99 mmol/L — AB (ref 101–111)
CO2: 29 mmol/L (ref 22–32)
CREATININE: 0.93 mg/dL (ref 0.44–1.00)
GFR calc Af Amer: >60 mL/min (ref >60)
GFR calc non Af Amer: 57 mL/min — ABNORMAL LOW (ref >60)
Glucose, Bld: 107 mg/dL — ABNORMAL HIGH (ref 65–99)
Potassium: 4.6 mmol/L (ref 3.5–5.1)
Sodium: 136 mmol/L (ref 135–145)

## 2015-04-04 LAB — CBC WITH DIFFERENTIAL/PLATELET
BASOS ABS: 0 10*3/uL (ref 0–0.1)
Basophils Relative: 0 %
Eosinophils Absolute: 0.3 10*3/uL (ref 0–0.7)
Eosinophils Relative: 3 %
HEMATOCRIT: 31.4 % — AB (ref 35.0–47.0)
Hemoglobin: 10.3 g/dL — ABNORMAL LOW (ref 12.0–16.0)
LYMPHS PCT: 17 %
Lymphs Abs: 1.5 10*3/uL (ref 1.0–3.6)
MCH: 28.8 pg (ref 26.0–34.0)
MCHC: 32.7 g/dL (ref 32.0–36.0)
MCV: 88.1 fL (ref 80.0–100.0)
Monocytes Absolute: 0.7 10*3/uL (ref 0.2–0.9)
Monocytes Relative: 8 %
NEUTROS PCT: 72 %
Neutro Abs: 6.4 10*3/uL (ref 1.4–6.5)
PLATELETS: 213 10*3/uL (ref 150–440)
RBC: 3.56 MIL/uL — AB (ref 3.80–5.20)
RDW: 13.8 % (ref 11.5–14.5)
WBC: 8.9 10*3/uL (ref 3.6–11.0)

## 2015-04-04 MED ORDER — ONDANSETRON HCL 4 MG/2ML IJ SOLN
4.0000 mg | INTRAMUSCULAR | Status: DC | PRN
  Administered 2015-04-04: 4 mg via INTRAVENOUS
  Filled 2015-04-04: qty 2

## 2015-04-04 MED ORDER — CEFUROXIME AXETIL 250 MG PO TABS: 250.0000 mg | ORAL_TABLET | Freq: Two times a day (BID) | ORAL | Status: DC

## 2015-04-04 NOTE — Plan of Care (Signed)
Problem: Discharge Progression Outcomes Goal: Discharge plan in place and appropriate Individualization:  Outcome: Progressing Pt prefers to be called Lisa Becker. She is from home with her husband. Hx HTN, depression, sleep apnea, arthritis, neuropathy, and ovarian cancer (tx completed 2014) which are controlled by home medications. Pt is a high fall risk and shows understanding of how to use the call system for assistance.     Goal: Other Discharge Outcomes/Goals Outcome: Progressing Plan of care progress to goals: 1. Pain: restless leg pain relieved by scheduled norco.  2. Hemodynamically:             -Vitals stable, afebrile                 -IV Abx given as scheduled                 -WBC elevated but trending down 4. Tolerating diet: pt tolerating heart healthy diet   5. Activity: pt requires moderate assistance out of bed to [facility] due to generalized weakness and chronic back pain.No fall or injury this shift. Will continue to assess

## 2015-04-04 NOTE — Care Management Note (Signed)
Case Management Note  Patient Details  Name: Lisa Becker MRN: [id number] Date of Birth: 1934/01/20  Subjective/Objective:                  Patient sitting in bedside chair independently. She declined SNF but agrees to home health PT. She would like to use [facility]. She uses a rollator for ambulation. She lives with her husband. Patient anticipates discharge to home tomorrow.   Action/Plan: Referral made to [place] with [facility]. MD orders needed for PT along with face to face.   Expected Discharge Date:                  Expected Discharge Plan:     In-House Referral:     Discharge planning Services  CM Consult  Post Acute Care Choice:    Choice offered to:  Patient  DME Arranged:    DME Agency:     HH Arranged:  PT HH Agency:  [place]  Status of Service:     Medicare Important Message Given:  Yes Date Medicare IM Given:  04/04/15 Medicare IM give by:  Marshell Garfinkel Date Additional Medicare IM Given:    Additional Medicare Important Message give by:     If discussed at [place] of Stay Meetings, dates discussed:    Additional Comments:  Marshell Garfinkel, RN 04/04/2015, 1:21 PM

## 2015-04-04 NOTE — Progress Notes (Signed)
Physical Therapy Treatment Patient Details Name: Lisa Becker MRN: [id number] DOB: 08-Jan-1934 Today's Date: 04/04/2015    History of Present Illness Pt here with L  lower quadrant pain, feeling generally weak but overall much better    PT Comments    Pt is making good progress towards goals with improved functional independence this date. Pt able to perform safe ambulation in hallway without any rest breaks. Second person assist for following behind with recliner. Pt also demonstrates safe technique with hygiene during [place] transfer. Pt with good endurance and no SOB symptoms this date. Is safe for dc home with HHPT with pt in agreement. RN notified.  Follow Up Recommendations  Home health PT     Equipment Recommendations       Recommendations for Other Services       Precautions / Restrictions Precautions Precautions: Fall Restrictions Weight Bearing Restrictions: No    Mobility  Bed Mobility               General bed mobility comments: Pt in recliner upon arrival. Bed mobility not assessed  Transfers Overall transfer level: Modified independent Equipment used: Rolling walker (2 wheeled)             General transfer comment: sit<>Stand with rw and supervision. Safe technique performed.  Ambulation/Gait Ambulation/Gait assistance: Min guard Ambulation Distance (Feet): 160 Feet Assistive device: Rolling walker (2 wheeled)       General Gait Details: Pt ambulated using rw and cga. Reciprocal gait pattern performed. Pt ambulated to [facility] where she only required supervision for hygiene and then ambulated in hallway with no SOB symptoms noted. Good cadence performed with safe technique.    Stairs            Wheelchair Mobility    Modified Rankin (Stroke Patients Only)       Balance                                    Cognition Arousal/Alertness: Awake/alert Behavior During Therapy: WFL for tasks assessed/performed Overall  Cognitive Status: Within Functional Limits for tasks assessed                      Exercises      General Comments        Pertinent Vitals/Pain Pain Assessment: No/denies pain    Home Living                      Prior Function            PT Goals (current goals can now be found in the care plan section) Acute Rehab PT Goals Patient Stated Goal: "Get back home as soon as I can" PT Goal Formulation: With patient Time For Goal Achievement: 04/15/15 Potential to Achieve Goals: Good Progress towards PT goals: Progressing toward goals    Frequency  Min 2X/week    PT Plan Discharge plan needs to be updated    Co-evaluation             End of Session Equipment Utilized During Treatment: Gait belt Activity Tolerance: Patient tolerated treatment well Patient left: in chair;with chair alarm set     Time: 0922-0932 PT Time Calculation (min) (ACUTE ONLY): 10 min  Charges:  $Gait Training: 8-22 mins  G CodesGreggory Becker, PT, DPT [phone]  Lisa Becker 04/04/2015, 9:54 AM

## 2015-04-04 NOTE — Plan of Care (Signed)
Problem: Discharge Progression Outcomes Goal: Discharge plan in place and appropriate Individualization:  Outcome: Progressing Pt prefers to be called Lisa Becker. She is from home with her husband. Hx HTN, depression, sleep apnea, arthritis, neuropathy, and ovarian cancer (tx completed 2014) which are controlled by home medications. Pt is a high fall risk and shows understanding of how to use the call system for assistance. Goal: Other Discharge Outcomes/Goals Outcome: Progressing Plan of care progress to goals: 1. Pain: restless leg pain relieved by Mirapex, norco/vicodin now BID. 2. Hemodynamically:             -Vitals stable, afebrile                 -IV Abx given as scheduled             -WBC normal 3. Tolerating diet: pt tolerating heart healthy diet   4. Activity: walked with PT, much better than yesterday  5. C/o pain 1x, morphine given 1x. Pt stated relief No fall or injury this shift. 6. Pt planned to be d/c'd home with home health possibly tomorrow

## 2015-04-04 NOTE — Progress Notes (Signed)
Patient ID: Lisa Becker, female   DOB: [birth year], 81 y.o.   MRN: [id number] Lisa Becker is a 81 y.o. female   SUBJECTIVE:  Patient doing a lot better. E coli in urine and blood. Patient notes significant pelvic pain on ambulation, very limited ambulation yesterday, does feel better today ______________________________________________________________________  ROS: Review of systems is unremarkable for any active cardiac,respiratory, GI, GU, hematologic, neurologic or psychiatric systems, 10 systems reviewed.  . calcium carbonate  1 tablet Oral QPM  . cefTRIAXone (ROCEPHIN)  IV  1 g Intravenous Q12H  . cholecalciferol  2,000 Units Oral Q breakfast  . citalopram  20 mg Oral Q breakfast  . docusate sodium  100 mg Oral BID  . gabapentin  200 mg Oral TID  . HYDROcodone-acetaminophen  1 tablet Oral BID  . magnesium oxide  200 mg Oral QPM  . mometasone-formoterol  2 puff Inhalation BID  . potassium chloride  20 mEq Oral Daily  . pramipexole  0.25 mg Oral BID  . simvastatin  20 mg Oral QPM  . vitamin B-12  1,000 mcg Oral Q breakfast  . ascorbic acid  1,000 mg Oral Q breakfast   acetaminophen **OR** acetaminophen, albuterol, morphine injection, ondansetron (ZOFRAN) IV, zolpidem   Past Medical History  Diagnosis Date  . Hypertension   . Anemia   . Sleep apnea     CPAP in use q night   . Pulmonary hypertension     followed by Dr. Raul Del, Jefm Bryant, PFT's done at that last visit- mid April- 2015  . Shortness of breath   . Depression   . Nervous indigestion     uses one TUM, if this occurs (maybe once per month)    . Arthritis     lumbar stenosis  . Neuromuscular disorder     neuropathy hands, feet, legs   . Cancer 2010    ovarian, reoccurence - 2013, treatment completed through porta cath. 2014   . Asthma     Past Surgical History  Procedure Laterality Date  . Total hip arthroplasty  2013  . Knee surgery Left 2006  . Eus  08/20/2012    Procedure: UPPER ENDOSCOPIC  ULTRASOUND (EUS) LINEAR;  Surgeon: Milus Banister, MD;  Location: [facility];  Service: Endoscopy;  Laterality: N/A;  radial/ linear   . Joint replacement Right     both knees have been replaced    . Tonsillectomy    . Abdominal hysterectomy    . Back surgery  2003    prior to 2003 episode, had back surgery (lumbar)- 1989  . Laparotomy  2013    for mass in abdomen   . Blepharoplasty      PHYSICAL EXAM:  BP 105/41 mmHg  Pulse 69  Temp(Src) 97.5 F (36.4 C) (Oral)  Resp 20  Ht 5\' 2"  (1.575 m)  Wt 97.977 kg (216 lb)  BMI 39.50 kg/m2  SpO2 96%  Wt Readings from Last 3 Encounters:  04/04/15 97.977 kg (216 lb)  05/10/14 98.884 kg (218 lb)  05/05/14 98.9 kg (218 lb 0.6 oz)           BP Readings from Last 3 Encounters:  04/04/15 105/41  05/12/14 131/54  05/07/14 114/39    Constitutional: NAD Neck: supple, no thyromegaly Respiratory: CTA, no rales or wheezes Cardiovascular: RRR, no murmur, no gallop Abdomen: soft, good BS, nontender Extremities: no edema Neuro: alert and oriented, no focal motor or sensory deficits  ASSESSMENT/PLAN:  Labs and imaging studies were reviewed  Escherichia coli septic shock - blood pressure better, continue IV Rocephin, off Zosyn, white count back to normal Sacral fracture-no sign for pathologic fracture, Norco twice a day, morphine when necessary, intolerant to tramadol Ovarian CA-oncology follow-up ROS-Mirapex twice a day We'll see how she ambulates today to decide home versus skilled nursing tomorrow

## 2015-04-05 ENCOUNTER — Encounter: Payer: Medicare Other | Admitting: Physical Therapy

## 2015-04-05 LAB — CULTURE, BLOOD (ROUTINE X 2)
SPECIAL REQUESTS: NORMAL
Special Requests: NORMAL

## 2015-04-05 MED ORDER — HYDROCODONE-ACETAMINOPHEN 5-325 MG PO TABS: 1.0000 | ORAL_TABLET | Freq: Two times a day (BID) | ORAL | Status: DC

## 2015-04-05 NOTE — Discharge Summary (Signed)
Lisa Becker, is a 81 y.o. female  DOB 1933/11/15  MRN [id number].  Admission date:  03/30/2015  Admitting Physician  Harrie Foreman, MD  Discharge Date:  04/05/2015    Admission Diagnosis  Pain [R52] Acute pyelonephritis [N10] Sacral fracture, closed, initial encounter [S32.10XA] Post-procedural fever [R50.82] Right-sided low back pain without sciatica [M54.5] Sepsis, due to unspecified organism [A41.9]  Discharge Diagnoses   Septic shock secondary to Escherichia coli UTI Acute sacral fracture Obstructive sleep apnea on CPAP Ovarian cancer Pulmonary hypertension Restless leg syndrome   Past Medical History  Diagnosis Date  . Hypertension   . Anemia   . Sleep apnea     CPAP in use q night   . Pulmonary hypertension     followed by Dr. Raul Del, Jefm Bryant, PFT's done at that last visit- mid April- 2015  . Shortness of breath   . Depression   . Nervous indigestion     uses one TUM, if this occurs (maybe once per month)    . Arthritis     lumbar stenosis  . Neuromuscular disorder     neuropathy hands, feet, legs   . Cancer 2010    ovarian, reoccurence - 2013, treatment completed through porta cath. 2014   . Asthma     Past Surgical History  Procedure Laterality Date  . Total hip arthroplasty  2013  . Knee surgery Left 2006  . Eus  08/20/2012    Procedure: UPPER ENDOSCOPIC ULTRASOUND (EUS) LINEAR;  Surgeon: Milus Banister, MD;  Location: [facility];  Service: Endoscopy;  Laterality: N/A;  radial/ linear   . Joint replacement Right     both knees have been replaced    . Tonsillectomy    . Abdominal hysterectomy    . Back surgery  2003    prior to 2003 episode, had back surgery (lumbar)- 1989  . Laparotomy  2013    for mass in abdomen   . Blepharoplasty         History of present illness and  Hospital Course:     Kindly see H&P for history of present illness and  admission details, please review complete Labs, Consult reports and Test reports for all details in brief  HPI  from the history and physical done on the day of admission    Hospital Course    Patient was admitted and placed on IV vancomycin and IV Zosyn. Her blood cultures and urine culture grew out Escherichia coli, pansensitive. Gravida biotics were changed to IV Rocephin and her white count normalized from [phone] on discharge. Her initial hypotension and tachycardia resolved with IV fluids. Abdominal CT scan showed right pyelonephritis. Lumbar MRI did not show any evidence of epidural abscess as her symptoms came on several days post epidural. She was fairly unstable on her feet with physical therapy got her up and moving around and she is stable for home with home health. There is no sign at the sacral fracture was caused by ovarian cancer and she will be  on low-dose hydrocodone when necessary for the pain with vitamin D.   Discharge Condition: Stable   Follow UP  Dr. Sabra Heck one week    Discharge Instructions  and  Discharge Medications  Albuterol inhaler 2 puffs every 6 when necessary Vitamin C daily Calcium/magnesium 500/250 daily Ceftin 250 mg twice a day 7 days Citalopram 20 mg daily CoQ10 one tab twice a day Advair 115/20 one twice a day Gabapentin 200 mg 3 times a day Norco 5/325 one twice a day when necessary pain  lipoic acid 300 mg daily Simvastatin 20 mg at bedtime Torsemide 20 mg daily B12 daily Vitron C daily   Today   Subjective:   Norissa Bartee today feeling good, ambulating fairly good, wants to go home  Objective:   Blood pressure 135/53, pulse 73, temperature 98.4 F (36.9 C), temperature source Oral, resp. rate 20, height 5\' 2"  (1.575 m), weight 96.843 kg (213 lb 8 oz), SpO2 97 %.   Exam Awake Alert, Oriented x 3, No new F.N deficits, Normal affect [place].AT,PERRAL Supple Neck,No JVD, No cervical lymphadenopathy appriciated.  Symmetrical  Chest wall movement, Good air movement bilaterally, CTAB RRR,No Gallops,Rubs or new Murmurs, Abd Soft, Non tender, No rebound. No Cyanosis, Clubbing or edema, No new Rash or bruise  Total Time in preparing paper work, data evaluation and todays exam - 35 minutes  Meggie Laseter F. M.D on 04/05/2015 at 7:58 AM

## 2015-04-05 NOTE — Discharge Instructions (Signed)
Antibiotic Medication °Antibiotics are among the most frequently prescribed medicines. Antibiotics cure illness by assisting our body to injure or kill the bacteria that cause infection. While antibiotics are useful to treat a wide variety of infections they are useless against viruses. Antibiotics cannot cure colds, flu, or other viral infections.  °There are many types of antibiotics available. Your caregiver will decide which antibiotic will be useful for an illness. Never take or give someone else's antibiotics or left over medicine. °Your caregiver may also take into account: °· Allergies. °· The cost of the medicine. °· Dosing schedules. °· Taste. °· Common side effects when choosing an antibiotic for an infection. °Ask your caregiver if you have questions about why a certain medicine was chosen. °HOME CARE INSTRUCTIONS °Read all instructions and labels on medicine bottles carefully. Some antibiotics should be taken on an empty stomach while others should be taken with food. Taking antibiotics incorrectly may reduce how well they work. Some antibiotics need to be kept in the refrigerator. Others should be kept at room temperature. Ask your caregiver or pharmacist if you do not understand how to give the medicine. °Be sure to give the amount of medicine your caregiver has prescribed. Even if you feel better and your symptoms improve, bacteria may still remain alive in the body. Taking all of the medicine will prevent: °· The infection from returning and becoming harder to treat. °· Complications from partially treated infections. °If there is any medicine left over after you have taken the medicine as your caregiver has instructed, throw the medicine away. °Be sure to tell your caregiver if you: °· Are allergic to any medicines. °· Are pregnant or intend to become pregnant while using this medicine. °· Are breastfeeding. °· Are taking any other prescription, non-prescription medicine, or herbal  remedies. °· Have any other medical conditions or problems you have not already discussed. °If you are taking birth control pills, they may not work while you are on antibiotics. To avoid unwanted pregnancy: °· Continue taking your birth control pills as usual. °· Use a second form of birth control (such as condoms) while you are taking antibiotic medicine. °· When you finish taking the antibiotic medicine, continue using the second form of birth control until you are finished with your current 1 month cycle of birth control pills. °Try not to miss any doses of medicine. If you miss a dose, take it as soon as possible. However, if it is almost time for the next dose and the dosing schedule is: °· 2 doses a day, take the missed dose and the next dose 5 to 6 hours apart. °· 3 or more doses a day, take the missed dose and the next dose 2 to 4 hours apart, then go back to the normal schedule. °· If you are unable to make up a missed dose, take the next scheduled dose on time and complete the missed dose at the end of the prescribed time for your medicine. °SIDE EFFECTS TO TAKING ANTIBIOTICS °Common side effects to antibiotic use include: °· Soft stools or diarrhea. °· Mild stomach upset. °· Sun sensitivity. °SEEK MEDICAL CARE IF:  °· If you get worse or do not improve within a few days of starting the medicine. °· Vomiting develops. °· Diaper rash or rash on the genitals appears. °· Vaginal itching occurs. °· White patches appear on the tongue or in the mouth. °· Severe watery diarrhea and abdominal cramps occur. °· Signs of an allergy develop (hives, unknown   itchy rash appears). STOP TAKING THE ANTIBIOTIC. °SEEK IMMEDIATE MEDICAL CARE IF:  °· Urine turns dark or blood colored. °· Skin turns yellow. °· Easy bruising or bleeding occurs. °· Joint pain or muscle aches occur. °· Fever returns. °· Severe headache occurs. °· Signs of an allergy develop (trouble breathing, wheezing, swelling of the lips, face or tongue,  fainting, or blisters on the skin or in the mouth). STOP TAKING THE ANTIBIOTIC. °Document Released: 07/10/2004 Document Revised: 01/20/2012 Document Reviewed: 07/20/2009 °Exit[employer]® Patient Information ©2015 [employer]. This information is not intended to replace advice given to you by your health care provider. Make sure you discuss any questions you have with your health care provider. ° °

## 2015-04-05 NOTE — Care Management (Signed)
Patient discharging home today followed by [facility]. I have notified care team at [facility] of patient discharge. No further RNCM needs. Case closed.

## 2015-04-05 NOTE — Plan of Care (Signed)
Problem: Discharge Progression Outcomes Goal: Discharge plan in place and appropriate Individualization:  Outcome: Progressing Pt prefers to be called Lisa Becker. She is from home with her husband. Hx HTN, depression, sleep apnea, arthritis, neuropathy, and ovarian cancer (tx completed 2014) which are controlled by home medications. Pt is a high fall risk and shows understanding of how to use the call system for assistance.        Goal: Other Discharge Outcomes/Goals Outcome: Progressing Plan of care progress to goal for: Pain-pt C/o pain in lower right back, prn morphine given x1 with improvement Hemodynamically-VSS Complications-no complications this shift Diet-pt tolerating diet Activity-pt up to [facility] with 1 assist

## 2015-04-05 NOTE — Plan of Care (Signed)
Problem: Discharge Progression Outcomes Goal: Other Discharge Outcomes/Goals Outcome: Adequate for Discharge Patient up to the bathroom with walker  Patient WBC counts are WNL Patient is urinating without pain and is going home on oral antibiotics  Received MD order to Discharge patient to home,

## 2015-04-12 ENCOUNTER — Ambulatory Visit: Payer: Medicare Other

## 2015-04-12 DIAGNOSIS — M818 Other osteoporosis without current pathological fracture: Secondary | ICD-10-CM | POA: Insufficient documentation

## 2015-04-12 DIAGNOSIS — Z8781 Personal history of (healed) traumatic fracture: Secondary | ICD-10-CM | POA: Insufficient documentation

## 2015-05-08 ENCOUNTER — Other Ambulatory Visit: Payer: Self-pay

## 2015-05-16 ENCOUNTER — Ambulatory Visit
Admission: RE | Admit: 2015-05-16 | Discharge: 2015-05-16 | Disposition: A | Discharge status: Home / Self Care | Payer: Medicare Other | Source: Ambulatory Visit | Attending: Oncology | Admitting: Oncology

## 2015-05-16 DIAGNOSIS — C561 Malignant neoplasm of right ovary: Secondary | ICD-10-CM

## 2015-05-16 DIAGNOSIS — C569 Malignant neoplasm of unspecified ovary: Secondary | ICD-10-CM | POA: Diagnosis present

## 2015-05-16 DIAGNOSIS — I700 Atherosclerosis of aorta: Secondary | ICD-10-CM | POA: Diagnosis not present

## 2015-05-16 DIAGNOSIS — Z9221 Personal history of antineoplastic chemotherapy: Secondary | ICD-10-CM | POA: Diagnosis not present

## 2015-05-16 LAB — GLUCOSE, CAPILLARY: GLUCOSE-CAPILLARY: 93 mg/dL (ref 65–99)

## 2015-05-16 MED ORDER — FLUDEOXYGLUCOSE F - 18 (FDG) INJECTION
12.0000 | Freq: Once | INTRAVENOUS | Status: AC | PRN
  Administered 2015-05-16: 12.95 via INTRAVENOUS

## 2015-05-18 ENCOUNTER — Other Ambulatory Visit: Payer: Self-pay | Admitting: *Deleted

## 2015-05-18 ENCOUNTER — Inpatient Hospital Stay: Payer: Medicare Other | Attending: Oncology

## 2015-05-18 ENCOUNTER — Inpatient Hospital Stay (HOSPITAL_BASED_OUTPATIENT_CLINIC_OR_DEPARTMENT_OTHER): Payer: Medicare Other | Admitting: Oncology

## 2015-05-18 VITALS — BP 131/86 | HR 71 | Temp 96.2°F | Wt 200.2 lb

## 2015-05-18 DIAGNOSIS — G4730 Sleep apnea, unspecified: Secondary | ICD-10-CM

## 2015-05-18 DIAGNOSIS — R599 Enlarged lymph nodes, unspecified: Secondary | ICD-10-CM | POA: Diagnosis not present

## 2015-05-18 DIAGNOSIS — I10 Essential (primary) hypertension: Secondary | ICD-10-CM | POA: Insufficient documentation

## 2015-05-18 DIAGNOSIS — R1032 Left lower quadrant pain: Secondary | ICD-10-CM | POA: Insufficient documentation

## 2015-05-18 DIAGNOSIS — Z79899 Other long term (current) drug therapy: Secondary | ICD-10-CM

## 2015-05-18 DIAGNOSIS — Z7982 Long term (current) use of aspirin: Secondary | ICD-10-CM | POA: Diagnosis not present

## 2015-05-18 DIAGNOSIS — Z136 Encounter for screening for cardiovascular disorders: Secondary | ICD-10-CM

## 2015-05-18 DIAGNOSIS — M4806 Spinal stenosis, lumbar region: Secondary | ICD-10-CM

## 2015-05-18 DIAGNOSIS — E538 Deficiency of other specified B group vitamins: Secondary | ICD-10-CM

## 2015-05-18 DIAGNOSIS — Z8543 Personal history of malignant neoplasm of ovary: Secondary | ICD-10-CM | POA: Diagnosis not present

## 2015-05-18 DIAGNOSIS — C569 Malignant neoplasm of unspecified ovary: Secondary | ICD-10-CM

## 2015-05-18 DIAGNOSIS — Z13220 Encounter for screening for lipoid disorders: Secondary | ICD-10-CM

## 2015-05-18 DIAGNOSIS — Z9221 Personal history of antineoplastic chemotherapy: Secondary | ICD-10-CM | POA: Insufficient documentation

## 2015-05-18 DIAGNOSIS — C561 Malignant neoplasm of right ovary: Secondary | ICD-10-CM

## 2015-05-18 DIAGNOSIS — M1990 Unspecified osteoarthritis, unspecified site: Secondary | ICD-10-CM | POA: Diagnosis not present

## 2015-05-18 LAB — COMPREHENSIVE METABOLIC PANEL
ALBUMIN: 3.9 g/dL (ref 3.5–5.0)
ALK PHOS: 98 U/L (ref 38–126)
ALT: 24 U/L (ref 14–54)
ANION GAP: 8 (ref 5–15)
AST: 28 U/L (ref 15–41)
BUN: 28 mg/dL — AB (ref 6–20)
CALCIUM: 8.6 mg/dL — AB (ref 8.9–10.3)
CO2: 30 mmol/L (ref 22–32)
Chloride: 100 mmol/L — ABNORMAL LOW (ref 101–111)
Creatinine, Ser: 0.98 mg/dL (ref 0.44–1.00)
GFR calc non Af Amer: 54 mL/min — ABNORMAL LOW (ref >60)
Glucose, Bld: 116 mg/dL — ABNORMAL HIGH (ref 65–99)
POTASSIUM: 4.3 mmol/L (ref 3.5–5.1)
SODIUM: 138 mmol/L (ref 135–145)
TOTAL PROTEIN: 7.2 g/dL (ref 6.5–8.1)
Total Bilirubin: 0.7 mg/dL (ref 0.3–1.2)

## 2015-05-18 LAB — LIPID PANEL
CHOL/HDL RATIO: 3.4 ratio
Cholesterol: 270 mg/dL — ABNORMAL HIGH (ref 0–200)
HDL: 79 mg/dL (ref >40)
LDL Cholesterol: 148 mg/dL — ABNORMAL HIGH (ref 0–99)
Triglycerides: 214 mg/dL — ABNORMAL HIGH (ref <150)
VLDL: 43 mg/dL — AB (ref 0–40)

## 2015-05-18 LAB — CBC WITH DIFFERENTIAL/PLATELET
BASOS ABS: 0 10*3/uL (ref 0–0.1)
Basophils Relative: 1 %
EOS ABS: 0.3 10*3/uL (ref 0–0.7)
EOS PCT: 5 %
HCT: 36.3 % (ref 35.0–47.0)
Hemoglobin: 11.6 g/dL — ABNORMAL LOW (ref 12.0–16.0)
Lymphocytes Relative: 27 %
Lymphs Abs: 1.6 10*3/uL (ref 1.0–3.6)
MCH: 28.7 pg (ref 26.0–34.0)
MCHC: 32.1 g/dL (ref 32.0–36.0)
MCV: 89.5 fL (ref 80.0–100.0)
Monocytes Absolute: 0.4 10*3/uL (ref 0.2–0.9)
Monocytes Relative: 7 %
Neutro Abs: 3.5 10*3/uL (ref 1.4–6.5)
Neutrophils Relative %: 60 %
PLATELETS: 227 10*3/uL (ref 150–440)
RBC: 4.05 MIL/uL (ref 3.80–5.20)
RDW: 15.9 % — AB (ref 11.5–14.5)
WBC: 5.9 10*3/uL (ref 3.6–11.0)

## 2015-05-18 LAB — TSH: TSH: 4.037 u[IU]/mL (ref 0.350–4.500)

## 2015-05-18 LAB — VITAMIN B12: VITAMIN B 12: 1946 pg/mL — AB (ref 180–914)

## 2015-05-18 LAB — MAGNESIUM: Magnesium: 1.9 mg/dL (ref 1.7–2.4)

## 2015-05-18 LAB — FERRITIN: Ferritin: 87 ng/mL (ref 11–307)

## 2015-05-18 NOTE — Progress Notes (Signed)
Patient does have living will. Never smoked. 

## 2015-05-19 LAB — CA 125: CA 125: 15.9 U/mL (ref 0.0–38.1)

## 2015-05-30 ENCOUNTER — Encounter: Payer: Self-pay | Admitting: Medical Oncology

## 2015-05-30 ENCOUNTER — Emergency Department: Payer: Medicare Other

## 2015-05-30 ENCOUNTER — Other Ambulatory Visit: Payer: Self-pay

## 2015-05-30 ENCOUNTER — Emergency Department
Admission: EM | Admit: 2015-05-30 | Discharge: 2015-05-30 | Disposition: A | Discharge status: Home / Self Care | Payer: Medicare Other | Attending: Emergency Medicine | Admitting: Emergency Medicine

## 2015-05-30 DIAGNOSIS — G8929 Other chronic pain: Secondary | ICD-10-CM | POA: Diagnosis not present

## 2015-05-30 DIAGNOSIS — Z7982 Long term (current) use of aspirin: Secondary | ICD-10-CM | POA: Insufficient documentation

## 2015-05-30 DIAGNOSIS — I10 Essential (primary) hypertension: Secondary | ICD-10-CM | POA: Diagnosis not present

## 2015-05-30 DIAGNOSIS — R1032 Left lower quadrant pain: Secondary | ICD-10-CM | POA: Diagnosis present

## 2015-05-30 DIAGNOSIS — Z79899 Other long term (current) drug therapy: Secondary | ICD-10-CM | POA: Insufficient documentation

## 2015-05-30 LAB — CBC WITH DIFFERENTIAL/PLATELET
Basophils Absolute: 0 10*3/uL (ref 0–0.1)
Basophils Relative: 0 %
EOS PCT: 6 %
Eosinophils Absolute: 0.5 10*3/uL (ref 0–0.7)
HCT: 35.1 % (ref 35.0–47.0)
Hemoglobin: 11.5 g/dL — ABNORMAL LOW (ref 12.0–16.0)
LYMPHS PCT: 27 %
Lymphs Abs: 2.2 10*3/uL (ref 1.0–3.6)
MCH: 29.1 pg (ref 26.0–34.0)
MCHC: 32.8 g/dL (ref 32.0–36.0)
MCV: 88.5 fL (ref 80.0–100.0)
MONOS PCT: 8 %
Monocytes Absolute: 0.6 10*3/uL (ref 0.2–0.9)
NEUTROS PCT: 59 %
Neutro Abs: 4.7 10*3/uL (ref 1.4–6.5)
Platelets: 242 10*3/uL (ref 150–440)
RBC: 3.97 MIL/uL (ref 3.80–5.20)
RDW: 16.3 % — ABNORMAL HIGH (ref 11.5–14.5)
WBC: 7.9 10*3/uL (ref 3.6–11.0)

## 2015-05-30 LAB — URINALYSIS COMPLETE WITH MICROSCOPIC (ARMC ONLY)
BILIRUBIN URINE: NEGATIVE
Bacteria, UA: NONE SEEN
Glucose, UA: NEGATIVE mg/dL
HGB URINE DIPSTICK: NEGATIVE
KETONES UR: NEGATIVE mg/dL
NITRITE: NEGATIVE
PH: 7 (ref 5.0–8.0)
PROTEIN: NEGATIVE mg/dL
SPECIFIC GRAVITY, URINE: 1.006 (ref 1.005–1.030)

## 2015-05-30 LAB — COMPREHENSIVE METABOLIC PANEL
ALK PHOS: 78 U/L (ref 38–126)
ALT: 21 U/L (ref 14–54)
AST: 25 U/L (ref 15–41)
Albumin: 4 g/dL (ref 3.5–5.0)
Anion gap: 13 (ref 5–15)
BILIRUBIN TOTAL: 0.5 mg/dL (ref 0.3–1.2)
BUN: 31 mg/dL — ABNORMAL HIGH (ref 6–20)
CHLORIDE: 95 mmol/L — AB (ref 101–111)
CO2: 30 mmol/L (ref 22–32)
Calcium: 9.3 mg/dL (ref 8.9–10.3)
Creatinine, Ser: 1 mg/dL (ref 0.44–1.00)
GFR calc Af Amer: >60 mL/min (ref >60)
GFR calc non Af Amer: 53 mL/min — ABNORMAL LOW (ref >60)
GLUCOSE: 111 mg/dL — AB (ref 65–99)
Potassium: 3.8 mmol/L (ref 3.5–5.1)
Sodium: 138 mmol/L (ref 135–145)
Total Protein: 7.5 g/dL (ref 6.5–8.1)

## 2015-05-30 LAB — LIPASE, BLOOD: LIPASE: 15 U/L — AB (ref 22–51)

## 2015-05-30 MED ORDER — IOHEXOL 240 MG/ML SOLN
25.0000 mL | Freq: Once | INTRAMUSCULAR | Status: AC | PRN
  Administered 2015-05-30: 25 mL via ORAL

## 2015-05-30 MED ORDER — IOHEXOL 300 MG/ML  SOLN
100.0000 mL | Freq: Once | INTRAMUSCULAR | Status: AC | PRN
  Administered 2015-05-30: 100 mL via INTRAVENOUS

## 2015-05-30 MED ORDER — ONDANSETRON HCL 4 MG/2ML IJ SOLN
4.0000 mg | INTRAMUSCULAR | Status: AC
  Administered 2015-05-30: 4 mg via INTRAVENOUS
  Filled 2015-05-30: qty 2

## 2015-05-30 MED ORDER — MORPHINE SULFATE 4 MG/ML IJ SOLN
4.0000 mg | Freq: Once | INTRAMUSCULAR | Status: AC
  Administered 2015-05-30: 4 mg via INTRAVENOUS
  Filled 2015-05-30: qty 1

## 2015-05-30 MED ORDER — OXYCODONE HCL 5 MG PO TABS: 5.0000 mg | ORAL_TABLET | Freq: Four times a day (QID) | ORAL | Status: DC | PRN

## 2015-05-30 NOTE — ED Notes (Addendum)
Pt here from [facility] with c/o left sided abdominal pain; was seen here in May and tx for e coli and UTI, pt reports being admitted and being treated with antibiotics since then. Pt reports pain has gotten worse over the past few days. Pt reports taking hydrocone around 0930. Pt also with hx of ovarian cancer, now with possible mets to aorta and left subclavian area, head of pancreas and right ileum.

## 2015-05-30 NOTE — Discharge Instructions (Signed)
Abdominal Pain  These follow-up with oncology at 8 AM at the clinic. Please stop hydrocodone and all other strong pain medications. You may use oxycodone as prescribed for severe pain.  Many things can cause belly (abdominal) pain. Most times, the belly pain is not dangerous. Many cases of belly pain can be watched and treated at home. HOME CARE   Do not take medicines that help you go poop (laxatives) unless told to by your doctor.  Only take medicine as told by your doctor.  Eat or drink as told by your doctor. Your doctor will tell you if you should be on a special diet. GET HELP IF:  You do not know what is causing your belly pain.  You have belly pain while you are sick to your stomach (nauseous) or have runny poop (diarrhea).  You have pain while you pee or poop.  Your belly pain wakes you up at night.  You have belly pain that gets worse or better when you eat.  You have belly pain that gets worse when you eat fatty foods.  You have a fever. GET HELP RIGHT AWAY IF:   The pain does not go away within 2 hours.  You keep throwing up (vomiting).  The pain changes and is only in the right or left part of the belly.  You have bloody or tarry looking poop. MAKE SURE YOU:   Understand these instructions.  Will watch your condition.  Will get help right away if you are not doing well or get worse. Document Released: 04/15/2008 Document Revised: 11/02/2013 Document Reviewed: 07/07/2013 [facility] Patient Information 2015 [place], [place]. This information is not intended to replace advice given to you by your health care provider. Make sure you discuss any questions you have with your health care provider.

## 2015-05-30 NOTE — ED Provider Notes (Signed)
[facility] Emergency Department Provider Note  ____________________________________________  Time seen: Approximately 10:55 AM  I have reviewed the triage vital signs and the nursing notes.   HISTORY  Chief Complaint Abdominal Pain    HPI Lisa Becker is a 81 y.o. female history of recent resistant urinary tract infection, and ovarian cancer metastatic currently not on chemotherapy or radiation but under the care of Dr. Jeb Levering.  Patient reports that she's been having left-sided abdominal pain since May, it is progressively worsening and located over the left lower to mid abdomen. There is no fever or chills. No vomiting or diarrhea. She is slightly nauseated but not constipated.  She took a hydrocodone this morning at home for the pain and it has not provided relief. She reports this pain is just steadily seeming to get worse and more frequent over the last 2 months. It is unclear, but her family believes this could be due to her cancer pain.  No chest pain. No shortness of breath. No fevers or chills.   Past Medical History  Diagnosis Date  . Hypertension   . Anemia   . Sleep apnea     CPAP in use q night   . Pulmonary hypertension     followed by Dr. Raul Del, [facility], PFT's done at that last visit- mid April- 2015  . Shortness of breath   . Depression   . Nervous indigestion     uses one TUM, if this occurs (maybe once per month)    . Arthritis     lumbar stenosis  . Neuromuscular disorder     neuropathy hands, feet, legs   . Cancer 2010    ovarian, reoccurence - 2013, treatment completed through porta cath. 2014   . Asthma     Patient Active Problem List   Diagnosis Date Noted  . Septic shock 03/31/2015  . Sacral fracture 03/31/2015  . Ovarian cancer 03/31/2015  . UTI (lower urinary tract infection) 03/31/2015  . Lumbago 05/08/2014  . Spondylolisthesis of lumbar region 05/05/2014    Past Surgical History  Procedure Laterality  Date  . Total hip arthroplasty  2013  . Knee surgery Left 2006  . Eus  08/20/2012    Procedure: UPPER ENDOSCOPIC ULTRASOUND (EUS) LINEAR;  Surgeon: Milus Banister, MD;  Location: [facility] ENDOSCOPY;  Service: Endoscopy;  Laterality: N/A;  radial/ linear   . Joint replacement Right     both knees have been replaced    . Tonsillectomy    . Abdominal hysterectomy    . Back surgery  2003    prior to 2003 episode, had back surgery (lumbar)- 1989  . Laparotomy  2013    for mass in abdomen   . Blepharoplasty      Current Outpatient Rx  Name  Route  Sig  Dispense  Refill  . albuterol (PROVENTIL HFA;VENTOLIN HFA) 108 (90 BASE) MCG/ACT inhaler   Inhalation   Inhale 2 puffs into the lungs every 6 (six) hours as needed for wheezing or shortness of breath.         . Alpha-Lipoic Acid (LIPOIC ACID PO)   Oral   Take 300 mg by mouth every evening.          Marland Kitchen aspirin EC 81 MG tablet   Oral   Take 81 mg by mouth daily.         . budesonide-formoterol (SYMBICORT) 160-4.5 MCG/ACT inhaler   Inhalation   Inhale 2 puffs into the lungs 2 (two)  times daily.         . calcium carbonate (TUMS - DOSED IN MG ELEMENTAL CALCIUM) 500 MG chewable tablet   Oral   Chew 1 tablet by mouth daily.         . Cholecalciferol (VITAMIN D3) 2000 UNITS TABS   Oral   Take 2,000 Units by mouth daily with breakfast.          . Coenzyme Q10-Fish Oil-Vit E (CO-Q 10 OMEGA-3 FISH OIL) CAPS   Oral   Take 1 capsule by mouth 2 (two) times daily.          . cyclobenzaprine (FLEXERIL) 10 MG tablet   Oral   Take 10 mg by mouth 2 (two) times daily as needed for muscle spasms.         Marland Kitchen gabapentin (NEURONTIN) 100 MG capsule   Oral   Take 300 mg by mouth 3 (three) times daily.          . Iron-Vitamin C (VITRON-C PO)   Oral   Take 1 tablet by mouth daily with breakfast.          . Magnesium Oxide 250 MG TABS   Oral   Take 1 tablet by mouth daily.         . nabumetone (RELAFEN) 500 MG tablet    Oral   Take 500 mg by mouth 2 (two) times daily.         . ondansetron (ZOFRAN) 4 MG tablet   Oral   Take 4 mg by mouth every 6 (six) hours as needed for nausea.         Marland Kitchen OVER THE COUNTER MEDICATION   Oral   Take 2 capsules by mouth daily. GSH-3P         . pramipexole (MIRAPEX) 0.25 MG tablet   Oral   Take 0.25 mg by mouth at bedtime as needed and may repeat dose one time if needed (restless leg).          . simvastatin (ZOCOR) 20 MG tablet   Oral   Take 20 mg by mouth every evening.         . torsemide (DEMADEX) 20 MG tablet   Oral   Take 10 mg by mouth daily with breakfast.          . triamterene-hydrochlorothiazide (MAXZIDE-25) 37.5-25 MG per tablet   Oral   Take 2 tablets by mouth daily.         . vitamin B-12 (CYANOCOBALAMIN) 1000 MCG tablet   Oral   Take 1,000 mcg by mouth daily with breakfast.          . oxyCODONE (ROXICODONE) 5 MG immediate release tablet   Oral   Take 1 tablet (5 mg total) by mouth every 6 (six) hours as needed for moderate pain or severe pain.   4 tablet   0     Allergies Amlodipine; Metoclopramide; Metronidazole; and Tramadol hcl er  Family History  Problem Relation Age of Onset  . Breast cancer Sister   . Breast cancer Maternal Aunt     Social History History  Substance Use Topics  . Smoking status: Never Smoker   . Smokeless tobacco: Not on file  . Alcohol Use: No    Review of Systems Constitutional: No fever/chills Eyes: No visual changes. ENT: No sore throat. Cardiovascular: Denies chest pain. Respiratory: Denies shortness of breath. Gastrointestinal: He history of present illness No diarrhea.  No constipation. Genitourinary: Negative for dysuria. No abnormal  urine odor. No burning with urination. Musculoskeletal: Negative for back pain. Skin: Negative for rash. Neurological: Negative for headaches, focal weakness or numbness.   10-point ROS otherwise  negative.  ____________________________________________   PHYSICAL EXAM:  VITAL SIGNS: ED Triage Vitals  Enc Vitals Group     BP 05/30/15 1022 120/48 mmHg     Pulse Rate 05/30/15 1022 79     Resp 05/30/15 1022 20     Temp 05/30/15 1022 98 F (36.7 C)     Temp Source 05/30/15 1022 Oral     SpO2 05/30/15 1022 99 %     Weight 05/30/15 1022 250 lb (113.399 kg)     Height 05/30/15 1022 5' (1.524 m)     Head Cir --      Peak Flow --      Pain Score 05/30/15 1023 10     Pain Loc --      Pain Edu? --      Excl. in [place]? --     Constitutional: Alert and oriented. Appears to be in pain, sitting up in the chair rocking herself back and forth holding her hand over her left flank and lower abdomen. Eyes: Conjunctivae are normal. PERRL. EOMI. Head: Atraumatic. Nose: No congestion/rhinnorhea. Mouth/Throat: Mucous membranes are moist.  Oropharynx non-erythematous. Neck: No stridor.   Cardiovascular: Normal rate, regular rhythm. Grossly normal heart sounds.  Good peripheral circulation. Respiratory: Normal respiratory effort.  No retractions. Lungs CTAB. Gastrointestinal: Soft and nontender except for some mild elucidation of pain with deep palpation in the left mid abdomen. No distention. No abdominal bruits. No CVA tenderness. Musculoskeletal: No lower extremity tenderness nor edema.  No joint effusions. Neurologic:  Normal speech and language. No gross focal neurologic deficits are appreciated. No gait instability. Skin:  Skin is warm, dry and intact. No rash noted. Psychiatric: Mood and affect are normal. Speech and behavior are normal.  ____________________________________________   LABS (all labs ordered are listed, but only abnormal results are displayed)  Labs Reviewed  CBC WITH DIFFERENTIAL/PLATELET - Abnormal; Notable for the following:    Hemoglobin 11.5 (*)    RDW 16.3 (*)    All other components within normal limits  COMPREHENSIVE METABOLIC PANEL - Abnormal; Notable for  the following:    Chloride 95 (*)    Glucose, Bld 111 (*)    BUN 31 (*)    GFR calc non Af Amer 53 (*)    All other components within normal limits  LIPASE, BLOOD - Abnormal; Notable for the following:    Lipase 15 (*)    All other components within normal limits  URINALYSIS COMPLETEWITH MICROSCOPIC ([facility] ONLY) - Abnormal; Notable for the following:    Color, Urine STRAW (*)    APPearance CLEAR (*)    Leukocytes, UA TRACE (*)    Squamous Epithelial / LPF 0-5 (*)    All other components within normal limits  URINE CULTURE   ____________________________________________  EKG  Reviewed and interpreted by me Sinus rhythm with occasional PVC. Ventricular rate 77 QRS 78 QTc 448 No acute ST abnormalities.  Interpreted as no ischemic changes, occasional PVC. Sinus rhythm. ____________________________________________  RADIOLOGY  CT Abdomen Pelvis W Contrast (Edited Result - FINAL) Result time: 05/30/15 12:39:50   Addendum 1 of 1 by Rad Results In Interface (05/30/15 12:39:50)   ADDENDUM REPORT: 05/30/2015 12:39  ADDENDUM: Delayed renal images shows bilateral renal symmetrical excretion. Bilateral visualized proximal ureter is unremarkable.   Electronically Signed  By: Orlean Bradford.D.  On: 05/30/2015 12:39       Final result by Rad Results In Interface (05/30/15 12:33:37)   Narrative:   CLINICAL DATA: Left side abdominal pain for 2 months, history of ovarian cancer, possible metastatic disease  EXAM: CT ABDOMEN AND PELVIS WITH CONTRAST  TECHNIQUE: Multidetector CT imaging of the abdomen and pelvis was performed using the standard protocol following bolus administration of intravenous contrast.  CONTRAST: 100 cc Omnipaque 300  COMPARISON: 05/16/2015  FINDINGS: Sagittal images of the spine shows posterior metallic fusion L3, L4 and L5 level. Degenerative changes L2-L3 level.  The lung bases are unremarkable.  Enhanced liver, spleen and adrenal  glands are unremarkable. Atrophic partially fatty replaced pancreas again noted. No calcified gallstones are noted within gallbladder. Stable cystic lesion in pancreatic head measures about 1.1 cm.  Again noted confluent retroperitoneal adenopathy axial image 47 just anterior to aorta measures 4.3 by 2.9 cm without change in size in appearance from prior exam. A portal caval lymph node again noted measures 1.4 cm stable.  Kidneys are symmetrical in size and enhancement. No hydronephrosis or hydroureter.  No small bowel obstruction. No ascites or free air. No adenopathy. There is no pericecal inflammation. Normal appendix. Next item the patient is status post hysterectomy. Suboptimal evaluation of the pelvis due to extensive metallic artifacts from left hip prosthesis.  No destructive bony lesions are noted within pelvis. Degenerative changes bilateral SI joints.  IMPRESSION: 1. There is stable retroperitoneal adenopathy. 2. Postsurgical changes with posterior metallic fusion L3, L4 and L5 level. 3. Degenerative changes bilateral SI joints. 4. No hepatic metastatic disease. 5. Again noted atrophic fatty replaced pancreas. Stable cystic lesion within pancreatic head. 6. Normal appendix. No pericecal inflammation. 7. Status post hysterectomy.    ____________________________________________   PROCEDURES  Procedure(s) performed: None  Critical Care performed: No  ____________________________________________   INITIAL IMPRESSION / ASSESSMENT AND PLAN / ED COURSE  Pertinent labs & imaging results that were available during my care of the patient were reviewed by me and considered in my medical decision making (see chart for details).  Patient presents with increasing abdominal pain. This is been ongoing for 2 months, but appears to be worsening to the point is severe and the patient is unable to get comfort even with oral pain medicine at home. She does not have any acute  infectious symptoms, she has no urinary symptoms and has a left only been treated and was told her last urine culture was clean only a couple weeks ago in clinic. I am very concerned this could represent pain from metastatic and/or cancer burden. She has sacral insufficiency fractures on her last PET scan, but her pain is really more located in the left mid abdomen. I do not believe this represents an acute infectious process, but given her cancer we will obtain CT imaging. She is very stable at this time. We will treat her for pain while in the ER, obtain labs and CT and I anticipate discussion with oncology.  Patient discussed with Dr. Jeb Levering. Recommends change to oxycodone and he will see her in clinic at [place]. Her CT and labs do not reveal any acute etiology, and I suspect after discussion with Dr. Dione Plover in his advice that this is likely related to her tumor burden. Discussed with the patient and family and they're very amenable to follow-up at 8 AM in the clinic with Dr. Jeb Levering and will stop hydrocodone and initiate oxycodone. ____________________________________________   FINAL CLINICAL IMPRESSION(S) / ED DIAGNOSES  Final diagnoses:  Abdominal pain, chronic, left lower quadrant      Delman Kitten, MD 05/30/15 1336

## 2015-05-31 ENCOUNTER — Encounter: Payer: Self-pay | Admitting: Oncology

## 2015-05-31 ENCOUNTER — Inpatient Hospital Stay: Payer: Medicare Other

## 2015-05-31 ENCOUNTER — Inpatient Hospital Stay (HOSPITAL_BASED_OUTPATIENT_CLINIC_OR_DEPARTMENT_OTHER): Payer: Medicare Other | Admitting: Oncology

## 2015-05-31 VITALS — BP 120/68 | HR 83 | Temp 97.0°F | Wt 218.5 lb

## 2015-05-31 DIAGNOSIS — R599 Enlarged lymph nodes, unspecified: Secondary | ICD-10-CM | POA: Diagnosis not present

## 2015-05-31 DIAGNOSIS — Z9221 Personal history of antineoplastic chemotherapy: Secondary | ICD-10-CM | POA: Diagnosis not present

## 2015-05-31 DIAGNOSIS — R1032 Left lower quadrant pain: Secondary | ICD-10-CM | POA: Diagnosis not present

## 2015-05-31 DIAGNOSIS — Z8543 Personal history of malignant neoplasm of ovary: Secondary | ICD-10-CM

## 2015-05-31 DIAGNOSIS — Z7982 Long term (current) use of aspirin: Secondary | ICD-10-CM

## 2015-05-31 DIAGNOSIS — G4730 Sleep apnea, unspecified: Secondary | ICD-10-CM

## 2015-05-31 DIAGNOSIS — M1990 Unspecified osteoarthritis, unspecified site: Secondary | ICD-10-CM

## 2015-05-31 DIAGNOSIS — J45909 Unspecified asthma, uncomplicated: Secondary | ICD-10-CM | POA: Insufficient documentation

## 2015-05-31 DIAGNOSIS — C801 Malignant (primary) neoplasm, unspecified: Secondary | ICD-10-CM

## 2015-05-31 DIAGNOSIS — C569 Malignant neoplasm of unspecified ovary: Secondary | ICD-10-CM

## 2015-05-31 DIAGNOSIS — M4806 Spinal stenosis, lumbar region: Secondary | ICD-10-CM

## 2015-05-31 DIAGNOSIS — Z79899 Other long term (current) drug therapy: Secondary | ICD-10-CM

## 2015-05-31 LAB — URINE CULTURE

## 2015-05-31 MED ORDER — HEPARIN SOD (PORK) LOCK FLUSH 100 UNIT/ML IV SOLN
500.0000 [IU] | Freq: Once | INTRAVENOUS | Status: AC
  Administered 2015-05-31: 500 [IU] via INTRAVENOUS

## 2015-05-31 MED ORDER — METRONIDAZOLE 500 MG PO TABS: 500.0000 mg | ORAL_TABLET | Freq: Three times a day (TID) | ORAL | Status: DC

## 2015-05-31 MED ORDER — SODIUM CHLORIDE 0.9 % IJ SOLN
10.0000 mL | INTRAMUSCULAR | Status: AC | PRN
  Administered 2015-05-31: 10 mL via INTRAVENOUS
  Filled 2015-05-31: qty 10

## 2015-05-31 MED ORDER — CIPROFLOXACIN HCL 500 MG PO TABS: 500.0000 mg | ORAL_TABLET | Freq: Two times a day (BID) | ORAL | Status: DC

## 2015-05-31 MED ORDER — HEPARIN SOD (PORK) LOCK FLUSH 100 UNIT/ML IV SOLN
INTRAVENOUS | Status: AC
  Filled 2015-05-31: qty 5

## 2015-05-31 NOTE — Progress Notes (Signed)
[place] @ [facility] Telephone:(336) [phone]  Fax:(336) [place]: 1933/11/27  MR#: [id number]  ENI#:[id number]  Patient Care Team: Rusty Aus, MD as PCP - General (Internal Medicine)  CHIEF COMPLAINT:  Chief Complaint  Patient presents with  . Follow-up    Oncology History   02/2009  Adenocarcinoma ovary with SB involvement             adequate TRS             Carboplatin and palitaxel x 6, moderately well tolerated, last treatment in August, clinically NED 09/2012 nodal recurrence,  resection,              carboplatin and paclitaxel (allergy) and gemcitabine (myelosuppression) x 5 completed NED 03/2013 CT scan and PET scan is consistent with retroperitoneal lymph node suspicious for metastatic disease     Ovarian cancer    Oncology Flowsheet 05/11/2014 05/11/2014 05/12/2014 03/30/2015 03/31/2015 04/04/2015 05/30/2015  dexamethasone (DECADRON) IV - - - - - - -  dexamethasone (DECADRON) PO - - - - - - -  diazepam (VALIUM) IV - - - - 1 mg - -  diazepam (VALIUM) PO 5 mg 5 mg 5 mg - - - -  ondansetron (ZOFRAN) IJ - - - - - - -  ondansetron (ZOFRAN) IM - - - - - - -  ondansetron (ZOFRAN) IV - - - 4 mg 4 mg 4 mg 4 mg    INTERVAL HISTORY:  81 year old lady presented to emergency room with increasing left lower quadrant pain.  Patient was admitted in hospital with UTI Escherichia coli sepsis in May of 2016. Urine analysis done in the emergency room yesterday was completely clear.  Pain is constant dull aching is chronic pain with acute exacerbation. Patient was given oxycodone resulting into improvement of pain pain localized in the left lower quadrant.  CT scan was done which revealed retroperitoneal adenopathy but left lower quadrant there was no evidence of any mass. Patient does not have any associated diarrhea fever.  Or constipation.  No rectal bleeding. REVIEW OF SYSTEMS:   Gen. status: Patient is feeling weak and tired. Lungs: No cough or shortness of  breath.  GI: Left lower quadrant abdominal pain as described in interval history. Cardiac: No chest pain.  GU: No dysuria hematuria urine analysis was clear culture is pending lower extremity swelling.. Neurological system: No headache no dizziness. Skin: No rash As per HPI. Otherwise, a complete review of systems is negatve.  PAST MEDICAL HISTORY: Past Medical History  Diagnosis Date  . Hypertension   . Anemia   . Sleep apnea     CPAP in use q night   . Pulmonary hypertension     followed by Dr. Raul Del, [facility], PFT's done at that last visit- mid April- 2015  . Shortness of breath   . Depression   . Nervous indigestion     uses one TUM, if this occurs (maybe once per month)    . Arthritis     lumbar stenosis  . Neuromuscular disorder     neuropathy hands, feet, legs   . Cancer 2010    ovarian, reoccurence - 2013, treatment completed through porta cath. 2014   . Asthma     PAST SURGICAL HISTORY: Past Surgical History  Procedure Laterality Date  . Total hip arthroplasty  2013  . Knee surgery Left 2006  . Eus  08/20/2012    Procedure: UPPER ENDOSCOPIC ULTRASOUND (EUS) LINEAR;  Surgeon: Milus Banister, MD;  Location: [facility];  Service: Endoscopy;  Laterality: N/A;  radial/ linear   . Joint replacement Right     both knees have been replaced    . Tonsillectomy    . Abdominal hysterectomy    . Back surgery  2003    prior to 2003 episode, had back surgery (lumbar)- 1989  . Laparotomy  2013    for mass in abdomen   . Blepharoplasty      FAMILY HISTORY Family History  Problem Relation Age of Onset  . Breast cancer Sister   . Breast cancer Maternal Aunt     ADVANCED DIRECTIVES:  Patient does have advance healthcare directive, Patient   does not desire to make any changes HEALTH MAINTENANCE: History  Substance Use Topics  . Smoking status: Never Smoker   . Smokeless tobacco: Not on file  . Alcohol Use: No      Allergies  Allergen Reactions  .  Amlodipine Swelling    "leg swelling"  . Metoclopramide Other (See Comments)    "interferes with my pramipexole"  . Metronidazole Diarrhea    "upset stomach"  . Tramadol Hcl Er Other (See Comments)    "hold fluid"    Current Outpatient Prescriptions  Medication Sig Dispense Refill  . albuterol (PROVENTIL HFA;VENTOLIN HFA) 108 (90 BASE) MCG/ACT inhaler Inhale 2 puffs into the lungs every 6 (six) hours as needed for wheezing or shortness of breath.    . Alpha-Lipoic Acid (LIPOIC ACID PO) Take 300 mg by mouth every evening.     Marland Kitchen aspirin EC 81 MG tablet Take 81 mg by mouth daily.    . budesonide-formoterol (SYMBICORT) 160-4.5 MCG/ACT inhaler Inhale 2 puffs into the lungs 2 (two) times daily.    . calcium carbonate (TUMS - DOSED IN MG ELEMENTAL CALCIUM) 500 MG chewable tablet Chew 1 tablet by mouth daily.    . Cholecalciferol (VITAMIN D3) 2000 UNITS TABS Take 2,000 Units by mouth daily with breakfast.     . Coenzyme Q10-Fish Oil-Vit E (CO-Q 10 OMEGA-3 FISH OIL) CAPS Take 1 capsule by mouth 2 (two) times daily.     . cyclobenzaprine (FLEXERIL) 10 MG tablet Take 10 mg by mouth 2 (two) times daily as needed for muscle spasms.    Marland Kitchen gabapentin (NEURONTIN) 300 MG capsule Take 300 mg by mouth 3 (three) times daily.    . Iron-Vitamin C (VITRON-C PO) Take 1 tablet by mouth daily with breakfast.     . Magnesium Oxide 250 MG TABS Take 1 tablet by mouth daily.    . nabumetone (RELAFEN) 500 MG tablet Take 500 mg by mouth 2 (two) times daily.    . ondansetron (ZOFRAN) 4 MG tablet Take 4 mg by mouth every 6 (six) hours as needed for nausea.    Marland Kitchen OVER THE COUNTER MEDICATION Take 2 capsules by mouth daily. GSH-3P    . oxyCODONE (ROXICODONE) 5 MG immediate release tablet Take 1 tablet (5 mg total) by mouth every 6 (six) hours as needed for moderate pain or severe pain. 4 tablet 0  . pramipexole (MIRAPEX) 0.25 MG tablet Take 0.25 mg by mouth at bedtime as needed and may repeat dose one time if needed (restless  leg).     . simvastatin (ZOCOR) 20 MG tablet Take 20 mg by mouth every evening.    . torsemide (DEMADEX) 20 MG tablet Take 10 mg by mouth daily with breakfast.     . triamterene-hydrochlorothiazide (MAXZIDE-25) 37.5-25 MG per  tablet Take 2 tablets by mouth daily.    . vitamin B-12 (CYANOCOBALAMIN) 1000 MCG tablet Take 1,000 mcg by mouth daily with breakfast.     . ciprofloxacin (CIPRO) 500 MG tablet Take 1 tablet (500 mg total) by mouth 2 (two) times daily. 14 tablet 0  . gabapentin (NEURONTIN) 100 MG capsule Take 300 mg by mouth 3 (three) times daily.     . metroNIDAZOLE (FLAGYL) 500 MG tablet Take 1 tablet (500 mg total) by mouth 3 (three) times daily. 21 tablet 0   No current facility-administered medications for this visit.   Facility-Administered Medications Ordered in Other Visits  Medication Dose Route Frequency Provider Last Rate Last Dose  . heparin lock flush 100 unit/mL  500 Units Intravenous Once Forest Gleason, MD      . sodium chloride 0.9 % injection 10 mL  10 mL Intravenous PRN Forest Gleason, MD        OBJECTIVE:  Filed Vitals:   05/31/15 0823  BP: 120/68  Pulse: 83  Temp: 97 F (36.1 C)     Body mass index is 42.67 kg/(m^2).    ECOG FS:1 - Symptomatic but completely ambulatory  PHYSICAL EXAM: General  status: Performance status is good.  Patient has not lost significant weight moderately obese lady  HEENT: No evidence of stomatitis. Sclera and conjunctivae :: No jaundice.   pale looking. Lungs: Air  entry equal on both sides.  No rhonchi.  No rales.  Cardiac: Heart sounds are normal.  No pericardial rub.  No murmur. Lymphatic system: Cervical, axillary, inguinal, lymph nodes not palpable GI: Abdomen is soft.  No ascites.  Liver spleen not palpable.  No tenderness.  Bowel sounds are within normal limitThere is no tenderness in the left lower quadrant.  Bowel sounds are present.  No palpable mass.   Lower extremity: No edema Neurological system: Higher functions,  cranial nerves intact no evidence of peripheral neuropathy. Skin: No rash.  No ecchymosis.Marland Kitchen   LAB RESULTS:  Admission on 05/30/2015, Discharged on 05/30/2015  Component Date Value Ref Range Status  . WBC 05/30/2015 7.9  3.6 - 11.0 K/uL Final  . RBC 05/30/2015 3.97  3.80 - 5.20 MIL/uL Final  . Hemoglobin 05/30/2015 11.5* 12.0 - 16.0 g/dL Final  . HCT 05/30/2015 35.1  35.0 - 47.0 % Final  . MCV 05/30/2015 88.5  80.0 - 100.0 fL Final  . MCH 05/30/2015 29.1  26.0 - 34.0 pg Final  . MCHC 05/30/2015 32.8  32.0 - 36.0 g/dL Final  . RDW 05/30/2015 16.3* 11.5 - 14.5 % Final  . Platelets 05/30/2015 242  150 - 440 K/uL Final  . Neutrophils Relative % 05/30/2015 59   Final  . Neutro Abs 05/30/2015 4.7  1.4 - 6.5 K/uL Final  . Lymphocytes Relative 05/30/2015 27   Final  . Lymphs Abs 05/30/2015 2.2  1.0 - 3.6 K/uL Final  . Monocytes Relative 05/30/2015 8   Final  . Monocytes Absolute 05/30/2015 0.6  0.2 - 0.9 K/uL Final  . Eosinophils Relative 05/30/2015 6   Final  . Eosinophils Absolute 05/30/2015 0.5  0 - 0.7 K/uL Final  . Basophils Relative 05/30/2015 0   Final  . Basophils Absolute 05/30/2015 0.0  0 - 0.1 K/uL Final  . Sodium 05/30/2015 138  135 - 145 mmol/L Final  . Potassium 05/30/2015 3.8  3.5 - 5.1 mmol/L Final  . Chloride 05/30/2015 95* 101 - 111 mmol/L Final  . CO2 05/30/2015 30  22 - 32 mmol/L  Final  . Glucose, Bld 05/30/2015 111* 65 - 99 mg/dL Final  . BUN 05/30/2015 31* 6 - 20 mg/dL Final  . Creatinine, Ser 05/30/2015 1.00  0.44 - 1.00 mg/dL Final  . Calcium 05/30/2015 9.3  8.9 - 10.3 mg/dL Final  . Total Protein 05/30/2015 7.5  6.5 - 8.1 g/dL Final  . Albumin 05/30/2015 4.0  3.5 - 5.0 g/dL Final  . AST 05/30/2015 25  15 - 41 U/L Final  . ALT 05/30/2015 21  14 - 54 U/L Final  . Alkaline Phosphatase 05/30/2015 78  38 - 126 U/L Final  . Total Bilirubin 05/30/2015 0.5  0.3 - 1.2 mg/dL Final  . GFR calc non Af Amer 05/30/2015 53* >60 mL/min Final  . GFR calc Af Amer 05/30/2015  >60  >60 mL/min Final   Comment: (NOTE) The eGFR has been calculated using the CKD EPI equation. This calculation has not been validated in all clinical situations. eGFR's persistently <60 mL/min signify possible Chronic Kidney Disease.   . Anion gap 05/30/2015 13  5 - 15 Final  . Lipase 05/30/2015 15* 22 - 51 U/L Final  . Color, Urine 05/30/2015 STRAW* YELLOW Final  . APPearance 05/30/2015 CLEAR* CLEAR Final  . Glucose, UA 05/30/2015 NEGATIVE  NEGATIVE mg/dL Final  . Bilirubin Urine 05/30/2015 NEGATIVE  NEGATIVE Final  . Ketones, ur 05/30/2015 NEGATIVE  NEGATIVE mg/dL Final  . Specific Gravity, Urine 05/30/2015 1.006  1.005 - 1.030 Final  . Hgb urine dipstick 05/30/2015 NEGATIVE  NEGATIVE Final  . pH 05/30/2015 7.0  5.0 - 8.0 Final  . Protein, ur 05/30/2015 NEGATIVE  NEGATIVE mg/dL Final  . Nitrite 05/30/2015 NEGATIVE  NEGATIVE Final  . Leukocytes, UA 05/30/2015 TRACE* NEGATIVE Final  . RBC / HPF 05/30/2015 0-5  0 - 5 RBC/hpf Final  . WBC, UA 05/30/2015 0-5  0 - 5 WBC/hpf Final  . Bacteria, UA 05/30/2015 NONE SEEN  NONE SEEN Final  . Squamous Epithelial / LPF 05/30/2015 0-5* NONE SEEN Final  . Mucous 05/30/2015 PRESENT   Final  . Hyaline Casts, UA 05/30/2015 PRESENT   Final      STUDIES: Ct Abdomen Pelvis W Contrast  05/30/2015   ADDENDUM REPORT: 05/30/2015 12:39  ADDENDUM: Delayed renal images shows bilateral renal symmetrical excretion. Bilateral visualized proximal ureter is unremarkable.   Electronically Signed   By: Lahoma Crocker M.D.   On: 05/30/2015 12:39   05/30/2015   CLINICAL DATA:  Left side abdominal pain for 2 months, history of ovarian cancer, possible metastatic disease  EXAM: CT ABDOMEN AND PELVIS WITH CONTRAST  TECHNIQUE: Multidetector CT imaging of the abdomen and pelvis was performed using the standard protocol following bolus administration of intravenous contrast.  CONTRAST:  100 cc Omnipaque 300  COMPARISON:  05/16/2015  FINDINGS: Sagittal images of the spine  shows posterior metallic fusion L3, L4 and L5 level. Degenerative changes L2-L3 level.  The lung bases are unremarkable.  Enhanced liver, spleen and adrenal glands are unremarkable. Atrophic partially fatty replaced pancreas again noted. No calcified gallstones are noted within gallbladder. Stable cystic lesion in pancreatic head measures about 1.1 cm.  Again noted confluent retroperitoneal adenopathy axial image 47 just anterior to aorta measures 4.3 by 2.9 cm without change in size in appearance from prior exam. A portal caval lymph node again noted measures 1.4 cm stable.  Kidneys are symmetrical in size and enhancement. No hydronephrosis or hydroureter.  No small bowel obstruction. No ascites or free air. No adenopathy. There is no  pericecal inflammation. Normal appendix. Next item the patient is status post hysterectomy. Suboptimal evaluation of the pelvis due to extensive metallic artifacts from left hip prosthesis.  No destructive bony lesions are noted within pelvis. Degenerative changes bilateral SI joints.  IMPRESSION: 1. There is stable retroperitoneal adenopathy. 2. Postsurgical changes with posterior metallic fusion L3, L4 and L5 level. 3. Degenerative changes bilateral SI joints. 4. No hepatic metastatic disease. 5. Again noted atrophic fatty replaced pancreas. Stable cystic lesion within pancreatic head. 6. Normal appendix.  No pericecal inflammation. 7. Status post hysterectomy.  Electronically Signed: By: Lahoma Crocker M.D. On: 05/30/2015 12:33   Nm Pet Image Restag (ps) Skull Base To Thigh  05/16/2015   CLINICAL DATA:  Subsequent treatment strategy for ovarian cancer treated with chemotherapy in 2013.  EXAM: NUCLEAR MEDICINE PET SKULL BASE TO THIGH  TECHNIQUE: 12.95 mCi F-18 FDG was injected intravenously. Full-ring PET imaging was performed from the skull base to thigh after the radiotracer. CT data was obtained and used for attenuation correction and anatomic localization.  FASTING BLOOD GLUCOSE:   Value: 93 mg/dl  COMPARISON:  Abdominal pelvic CT 03/31/2015.  PET-CT 09/26/2014  FINDINGS: NECK  There is a new hypermetabolic posterior left supraclavicular lymph node measuring 6 mm on image 60. This has an SUV max of 3.5. No other cervical hypermetabolic nodal activity demonstrated.There are no lesions of the pharyngeal mucosal space. Left IJ Port-A-Cath tip is in the lower SVC.  CHEST  There are no hypermetabolic mediastinal, hilar or axillary lymph nodes. No suspicious pulmonary activity or nodularity. Atherosclerosis of the aorta, great vessels and coronary arteries again noted.  ABDOMEN/PELVIS  The hepatic activity is mildly heterogeneous without definite focal lesion. No abnormal activity demonstrated within the spleen, pancreas or adrenal glands. The pancreas is diffusely atrophied. 1.3 cm nodule or fluid collection within the pancreatic head on image 170 does not have any abnormal metabolic activity. Again demonstrated are several hypermetabolic retroperitoneal lymph nodes. The largest nodes include a portacaval node measuring 1.4 x 1.4 cm on image 161 (SUV max 7.5) and a 4.5 x 2.4 cm preaortic node on image 189 (SUV max 14.3). The largest node is similar in size to the prior PET-CT at which time it had an SUV max 16.7. There is a 10 mm right common iliac node on image 203 which has an SUV max of 8.6.  SKELETON  There is no hypermetabolic activity to suggest osseous metastatic disease. There are extensive postsurgical changes within the lumbar spine status post laminectomy and fusion. There is bilateral hypermetabolic activity within the sacrum corresponding with bilateral sacral insufficiency fractures. No underlying lytic lesion identified.  IMPRESSION: 1. Persistent hypermetabolic retroperitoneal lymphadenopathy, dominant node similar to prior PET-CT from 8 months ago. Current study does demonstrate several small hypermetabolic nodes as well. 2. New hypermetabolic left supraclavicular lymph node  worrisome for metastatic disease. 3. Bilateral sacral insufficiency fractures.   Electronically Signed   By: Richardean Sale M.D.   On: 05/16/2015 13:27    ASSESSMENT: Left lower quadrant abdominal pain etiology is not clear on CT scan which has been reviewed independently by me does not show any evidence of mass.  Retroperitoneal lymphadenopathy persists. Possibility of diverticulitis or other pathology involving: Cannot be ruled out.  Patient was given Cipro 500 mg twice a day and Flagyl 500 mg 3 times a day for 7 days.  GI Department was consulted for possibility of sigmoidoscopy or colonoscopy Patient has pelvic examination scheduled by GYN oncologist. Oxycodone has  been given for pain.  MEDICAL DECISION MAKING:  CT scan has been reviewed.  All lab data from emergency room has been reviewed.  Urine culture would be reviewed.  Refer patient to GI for colonoscopy  Manage Colace and MiraLAX for constipation  Patient expressed understanding and was in agreement with this plan. She also understands that She can call clinic at any time with any questions, concerns, or complaints.    No matching staging information was found for the patient.  Forest Gleason, MD   05/31/2015 9:12 AM

## 2015-05-31 NOTE — Progress Notes (Signed)
Patient does have living will.  Never smoked.  Patient seen in ED yesterday for left abdominal pain.

## 2015-06-04 ENCOUNTER — Encounter: Payer: Self-pay | Admitting: Oncology

## 2015-06-04 NOTE — Progress Notes (Signed)
[place] @ [facility] [phone]  Fax:([phone] [place]: [birth year]  MR#: [id number]  DHR#:[id number]  Patient Care Team: Rusty Aus, MD as PCP - General (Internal Medicine)  CHIEF COMPLAINT:  Chief Complaint  Patient presents with  . Follow-up    Oncology History   02/2009  Adenocarcinoma ovary with SB involvement             adequate TRS             Carboplatin and palitaxel x 6, moderately well tolerated, last treatment in August, clinically NED 09/2012 nodal recurrence,  resection,              carboplatin and paclitaxel (allergy) and gemcitabine (myelosuppression) x 5 completed NED 03/2013 CT scan and PET scan is consistent with retroperitoneal lymph node suspicious for metastatic disease     Ovarian cancer    H/O ovarian cancer   11/25/2014 Initial Diagnosis H/O ovarian cancer    Oncology Flowsheet 05/11/2014 05/11/2014 05/12/2014 03/30/2015 03/31/2015 04/04/2015 05/30/2015  dexamethasone (DECADRON) IV - - - - - - -  dexamethasone (DECADRON) PO - - - - - - -  diazepam (VALIUM) IV - - - - 1 mg - -  diazepam (VALIUM) PO 5 mg 5 mg 5 mg - - - -  ondansetron (ZOFRAN) IJ - - - - - - -  ondansetron (ZOFRAN) IM - - - - - - -  ondansetron (ZOFRAN) IV - - - 4 mg 4 mg 4 mg 4 mg    INTERVAL HISTORY: 81 year old lady came today further follow-up.  Patient had a retroperitoneal lymphadenopathy which is progressing on a PET scan.  Patient had a previous history of ovarian cancer with metastases to retroperitoneal lymph node Recently the patient was admitted in the hospital with UTI, Escherichia coli and sepsis treated with IV antibiotics Gradually recovering.  He has persistent left lower quadrant pain  REVIEW OF SYSTEMS:   GENERAL:  Feels good.  Active.  No fevers, sweats or weight loss. PERFORMANCE STATUS (ECOG):01 HEENT:  No visual changes, runny nose, sore throat, mouth sores or tenderness. Lungs: No shortness of breath or cough.  No  hemoptysis. Cardiac:  No chest pain, palpitations, orthopnea, or PND. GI: Lower quadrant pain. GU:  No urgency, frequency, dysuria, or hematuria. Patient was admitted in the hospital with Escherichia coli sepsis Musculoskeletal:  No back pain.  No joint pain.  No muscle tenderness. Extremities:  No pain or swelling. Skin:  No rashes or skin changes. Neuro:  No headache, numbness or weakness, balance or coordination issues. Endocrine:  No diabetes, thyroid issues, hot flashes or night sweats. Psych:  No mood changes, depression or anxiety. Pain:  No focal pain. Review of systems:  All other systems reviewed and found to be negative. As per HPI. Otherwise, a complete review of systems is negatve.  PAST MEDICAL HISTORY: Past Medical History  Diagnosis Date  . Hypertension   . Anemia   . Sleep apnea     CPAP in use q night   . Pulmonary hypertension     followed by Dr. Raul Del, Jef[facility] Bryant, PFT's done at that last visit- mid April- 2015  . Shortness of breath   . Depression   . Nervous indigestion     uses one TUM, if this occurs (maybe once per month)    . Arthritis     lumbar stenosis  . Neuromuscular disorder     neuropathy hands, feet, legs   .  Cancer 2010    ovarian, reoccurence - 2013, treatment completed through porta cath. 2014   . Asthma     PAST SURGICAL HISTORY: Past Surgical History  Procedure Laterality Date  . Total hip arthroplasty  2013  . Knee surgery Left 2006  . Eus  08/20/2012    Procedure: UPPER ENDOSCOPIC ULTRASOUND (EUS) LINEAR;  Surgeon: Milus Banister, MD;  Location: [facility] END[facility];  Service: Endoscopy;  Laterality: N/A;  radial/ linear   . Joint replacement Right     both knees have been replaced    . Tonsillectomy    . Abdominal hysterectomy    . Back surgery  2003    prior to 2003 episode, had back surgery (lumbar)- 1989  . Laparotomy  2013    for mass in abdomen   . Blepharoplasty      FAMILY HISTORY Family History  Problem Relation Age  of Onset  . Breast cancer Sister   . Breast cancer Maternal Aunt     ADVANCED DIRECTIVES:  Patient does have advance healthcare directive, Patient   does not desire to make any changes HEALTH MAINTENANCE: History  Substance Use Topics  . Smoking status: Never Smoker   . Smokeless tobacco: Not on file  . Alcohol Use: No      Allergies  Allergen Reactions  . Amlodipine Swelling    "leg swelling"  . Metoclopramide Other (See Comments)    "interferes with my pramipexole"  . Metronidazole Diarrhea    "upset stomach"  . Tramadol Hcl Er Other (See Comments)    "hold fluid"    Current Outpatient Prescriptions  Medication Sig Dispense Refill  . albuterol (PROVENTIL HFA;VENTOLIN HFA) 108 (90 BASE) MCG/ACT inhaler Inhale 2 puffs into the lungs every 6 (six) hours as needed for wheezing or shortness of breath.    . Alpha-Lipoic Acid (LIPOIC ACID PO) Take 300 mg by mouth every evening.     . Cholecalciferol (VITAMIN D3) 2000 UNITS TABS Take 2,000 Units by mouth daily with breakfast.     . Coenzyme Q10-Fish Oil-Vit E (CO-Q 10 OMEGA-3 FISH OIL) CAPS Take 1 capsule by mouth 2 (two) times daily.     Marland Kitchen gabapentin (NEURONTIN) 100 MG capsule Take 300 mg by mouth 3 (three) times daily.     . Iron-Vitamin C (VITRON-C PO) Take 1 tablet by mouth daily with breakfast.     . pramipexole (MIRAPEX) 0.25 MG tablet Take 0.25 mg by mouth at bedtime as needed and may repeat dose one time if needed (restless leg).     . simvastatin (ZOCOR) 20 MG tablet Take 20 mg by mouth every evening.    . torsemide (DEMADEX) 20 MG tablet Take 10 mg by mouth daily with breakfast.     . vitamin B-12 (CYANOCOBALAMIN) 1000 MCG tablet Take 1,000 mcg by mouth daily with breakfast.     . aspirin EC 81 MG tablet Take 81 mg by mouth daily.    . budesonide-formoterol (SYMBICORT) 160-4.5 MCG/ACT inhaler Inhale 2 puffs into the lungs 2 (two) times daily.    . calcium carbonate (TUMS - DOSED IN MG ELEMENTAL CALCIUM) 500 MG chewable  tablet Chew 1 tablet by mouth daily.    . ciprofloxacin (CIPRO) 500 MG tablet Take 1 tablet (500 mg total) by mouth 2 (two) times daily. 14 tablet 0  . cyclobenzaprine (FLEXERIL) 10 MG tablet Take 10 mg by mouth 2 (two) times daily as needed for muscle spasms.    Marland Kitchen gabapentin (NEURONTIN) 300 MG  capsule Take 300 mg by mouth 3 (three) times daily.    . Magnesium Oxide 250 MG TABS Take 1 tablet by mouth daily.    . metroNIDAZOLE (FLAGYL) 500 MG tablet Take 1 tablet (500 mg total) by mouth 3 (three) times daily. 21 tablet 0  . nabumetone (RELAFEN) 500 MG tablet Take 500 mg by mouth 2 (two) times daily.    . ondansetron (ZOFRAN) 4 MG tablet Take 4 mg by mouth every 6 (six) hours as needed for nausea.    Marland Kitchen OVER THE COUNTER MEDICATION Take 2 capsules by mouth daily. GSH-3P    . oxyCODONE (ROXICODONE) 5 MG immediate release tablet Take 1 tablet (5 mg total) by mouth every 6 (six) hours as needed for moderate pain or severe pain. 4 tablet 0  . triamterene-hydrochlorothiazide (MAXZIDE-25) 37.5-25 MG per tablet Take 2 tablets by mouth daily.     No current facility-administered medications for this visit.   Facility-Administered Medications Ordered in Other Visits  Medication Dose Route Frequency Provider Last Rate Last Dose  . sodium chloride 0.9 % injection 10 mL  10 mL Intravenous PRN Forest Gleason, MD   10 mL at 05/31/15 0912    OBJECTIVE:  Filed Vitals:   05/18/15 1128  BP: 131/86  Pulse: 71  Temp: 96.2 F (35.7 C)     Body mass index is 36.6 kg/(m^2).    ECOG FS:1 - Symptomatic but completely ambulatory  PHYSICAL EXAM: Goal status: Performance status is good.  Patient has not lost significant weight Moderately obese lady with BMI of 36 HEENT: No evidence of stomatitis. Sclera and conjunctivae :: No jaundice.   pale looking. Lungs: Air  entry equal on both sides.  No rhonchi.  No rales.  Cardiac: Heart sounds are normal.  No pericardial rub.  No murmur. Lymphatic system: Cervical,  axillary, inguinal, lymph nodes not palpable GI: Abdomen is soft.  No ascites.  Liver spleen not palpable.  No tenderness.  Bowel sounds are within normal limit Because of obesity  difficult to palpate deep organs or tenderness in left lower quadrant Lower extremity: No edema Neurological system: Higher functions, cranial nerves intact no evidence of peripheral neuropathy. Skin: No rash.  No ecchymosis.Marland Kitchen   LAB RESULTS:  Appointment on 05/18/2015  Component Date Value Ref Range Status  . WBC 05/18/2015 5.9  3.6 - 11.0 K/uL Final  . RBC 05/18/2015 4.05  3.80 - 5.20 MIL/uL Final  . Hemoglobin 05/18/2015 11.6* 12.0 - 16.0 g/dL Final  . HCT 05/18/2015 36.3  35.0 - 47.0 % Final  . MCV 05/18/2015 89.5  80.0 - 100.0 fL Final  . MCH 05/18/2015 28.7  26.0 - 34.0 pg Final  . MCHC 05/18/2015 32.1  32.0 - 36.0 g/dL Final  . RDW 05/18/2015 15.9* 11.5 - 14.5 % Final  . Platelets 05/18/2015 227  150 - 440 K/uL Final  . Neutrophils Relative % 05/18/2015 60   Final  . Neutro Abs 05/18/2015 3.5  1.4 - 6.5 K/uL Final  . Lymphocytes Relative 05/18/2015 27   Final  . Lymphs Abs 05/18/2015 1.6  1.0 - 3.6 K/uL Final  . Monocytes Relative 05/18/2015 7   Final  . Monocytes Absolute 05/18/2015 0.4  0.2 - 0.9 K/uL Final  . Eosinophils Relative 05/18/2015 5   Final  . Eosinophils Absolute 05/18/2015 0.3  0 - 0.7 K/uL Final  . Basophils Relative 05/18/2015 1   Final  . Basophils Absolute 05/18/2015 0.0  0 - 0.1 K/uL Final  .  Sodium 05/18/2015 138  135 - 145 mmol/L Final  . Potassium 05/18/2015 4.3  3.5 - 5.1 mmol/L Final  . Chloride 05/18/2015 100* 101 - 111 mmol/L Final  . CO2 05/18/2015 30  22 - 32 mmol/L Final  . Glucose, Bld 05/18/2015 116* 65 - 99 mg/dL Final  . BUN 05/18/2015 28* 6 - 20 mg/dL Final  . Creatinine, Ser 05/18/2015 0.98  0.44 - 1.00 mg/dL Final  . Calcium 05/18/2015 8.6* 8.9 - 10.3 mg/dL Final  . Total Protein 05/18/2015 7.2  6.5 - 8.1 g/dL Final  . Albumin 05/18/2015 3.9  3.5 - 5.0 g/dL  Final  . AST 05/18/2015 28  15 - 41 U/L Final  . ALT 05/18/2015 24  14 - 54 U/L Final  . Alkaline Phosphatase 05/18/2015 98  38 - 126 U/L Final  . Total Bilirubin 05/18/2015 0.7  0.3 - 1.2 mg/dL Final  . GFR calc non Af Amer 05/18/2015 54* >60 mL/min Final  . GFR calc Af Amer 05/18/2015 >60  >60 mL/min Final   Comment: (NOTE) The eGFR has been calculated using the CKD EPI equation. This calculation has not been validated in all clinical situations. eGFR's persistently <60 mL/min signify possible Chronic Kidney Disease.   . Anion gap 05/18/2015 8  5 - 15 Final  . CA 125 05/18/2015 15.9  0.0 - 38.1 U/mL Final   Comment: (NOTE) Roche ECLIA methodology Performed At: [facility] [place], [place] [id number] Lindon Romp MD VO:[id number]   . Magnesium 05/18/2015 1.9  1.7 - 2.4 mg/dL Final  . Cholesterol 05/18/2015 270* 0 - 200 mg/dL Final  . Triglycerides 05/18/2015 214* <150 mg/dL Final  . HDL 05/18/2015 79  >40 mg/dL Final  . Total CHOL/HDL Ratio 05/18/2015 3.4   Final  . VLDL 05/18/2015 43* 0 - 40 mg/dL Final  . LDL Cholesterol 05/18/2015 148* 0 - 99 mg/dL Final   Comment:        Total Cholesterol/HDL:CHD Risk Coronary Heart Disease Risk Table                     Men   Women  1/2 Average Risk   3.4   3.3  Average Risk       5.0   4.4  2 X Average Risk   9.6   7.1  3 X Average Risk  23.4   11.0        Use the calculated Patient Ratio above and the CHD Risk Table to determine the patient's CHD Risk.        ATP III CLASSIFICATION (LDL):  <100     mg/dL   Optimal  100-129  mg/dL   Near or Above                    Optimal  130-159  mg/dL   Borderline  160-189  mg/dL   High  >190     mg/dL   Very High   . Vitamin B-12 05/18/2015 1946* 180 - 914 pg/mL Final   Comment: (NOTE) This assay is not validated for testing neonatal or myeloproliferative syndrome specimens for Vitamin B12 levels. Performed at [facility]   . Ferritin 05/18/2015 87   11 - 307 ng/mL Final  . TSH 05/18/2015 4.037  0.350 - 4.500 uIU/mL Final     STUDIES: Ct Abdomen Pelvis W Contrast  05/30/2015   ADDENDUM REPORT: 05/30/2015 12:39  ADDENDUM: Delayed renal images shows bilateral renal  symmetrical excretion. Bilateral visualized proximal ureter is unremarkable.   Electronically Signed   By: Lahoma Crocker M.D.   On: 05/30/2015 12:39   05/30/2015   CLINICAL DATA:  Left side abdominal pain for 2 months, history of ovarian cancer, possible metastatic disease  EXAM: CT ABDOMEN AND PELVIS WITH CONTRAST  TECHNIQUE: Multidetector CT imaging of the abdomen and pelvis was performed using the standard protocol following bolus administration of intravenous contrast.  CONTRAST:  100 cc Omnipaque 300  COMPARISON:  05/16/2015  FINDINGS: Sagittal images of the spine shows posterior metallic fusion L3, L4 and L5 level. Degenerative changes L2-L3 level.  The lung bases are unremarkable.  Enhanced liver, spleen and adrenal glands are unremarkable. Atrophic partially fatty replaced pancreas again noted. No calcified gallstones are noted within gallbladder. Stable cystic lesion in pancreatic head measures about 1.1 cm.  Again noted confluent retroperitoneal adenopathy axial image 47 just anterior to aorta measures 4.3 by 2.9 cm without change in size in appearance from prior exam. A portal caval lymph node again noted measures 1.4 cm stable.  Kidneys are symmetrical in size and enhancement. No hydronephrosis or hydroureter.  No small bowel obstruction. No ascites or free air. No adenopathy. There is no pericecal inflammation. Normal appendix. Next item the patient is status post hysterectomy. Suboptimal evaluation of the pelvis due to extensive metallic artifacts from left hip prosthesis.  No destructive bony lesions are noted within pelvis. Degenerative changes bilateral SI joints.  IMPRESSION: 1. There is stable retroperitoneal adenopathy. 2. Postsurgical changes with posterior metallic fusion L3,  L4 and L5 level. 3. Degenerative changes bilateral SI joints. 4. No hepatic metastatic disease. 5. Again noted atrophic fatty replaced pancreas. Stable cystic lesion within pancreatic head. 6. Normal appendix.  No pericecal inflammation. 7. Status post hysterectomy.  Electronically Signed: By: Lahoma Crocker M.D. On: 05/30/2015 12:33   Nm Pet Image Restag (ps) Skull Base To Thigh  05/16/2015   CLINICAL DATA:  Subsequent treatment strategy for ovarian cancer treated with chemotherapy in 2013.  EXAM: NUCLEAR MEDICINE PET SKULL BASE TO THIGH  TECHNIQUE: 12.95 mCi F-18 FDG was injected intravenously. Full-ring PET imaging was performed from the skull base to thigh after the radiotracer. CT data was obtained and used for attenuation correction and anatomic localization.  FASTING BLOOD GLUCOSE:  Value: 93 mg/dl  COMPARISON:  Abdominal pelvic CT 03/31/2015.  PET-CT 09/26/2014  FINDINGS: NECK  There is a new hypermetabolic posterior left supraclavicular lymph node measuring 6 mm on image 60. This has an SUV max of 3.5. No other cervical hypermetabolic nodal activity demonstrated.There are no lesions of the pharyngeal mucosal space. Left IJ Port-A-Cath tip is in the lower SVC.  CHEST  There are no hypermetabolic mediastinal, hilar or axillary lymph nodes. No suspicious pulmonary activity or nodularity. Atherosclerosis of the aorta, great vessels and coronary arteries again noted.  ABDOMEN/PELVIS  The hepatic activity is mildly heterogeneous without definite focal lesion. No abnormal activity demonstrated within the spleen, pancreas or adrenal glands. The pancreas is diffusely atrophied. 1.3 cm nodule or fluid collection within the pancreatic head on image 170 does not have any abnormal metabolic activity. Again demonstrated are several hypermetabolic retroperitoneal lymph nodes. The largest nodes include a portacaval node measuring 1.4 x 1.4 cm on image 161 (SUV max 7.5) and a 4.5 x 2.4 cm preaortic node on image 189 (SUV max  14.3). The largest node is similar in size to the prior PET-CT at which time it had an SUV max 16.7.  There is a 10 mm right common iliac node on image 203 which has an SUV max of 8.6.  SKELETON  There is no hypermetabolic activity to suggest osseous metastatic disease. There are extensive postsurgical changes within the lumbar spine status post laminectomy and fusion. There is bilateral hypermetabolic activity within the sacrum corresponding with bilateral sacral insufficiency fractures. No underlying lytic lesion identified.  IMPRESSION: 1. Persistent hypermetabolic retroperitoneal lymphadenopathy, dominant node similar to prior PET-CT from 8 months ago. Current study does demonstrate several small hypermetabolic nodes as well. 2. New hypermetabolic left supraclavicular lymph node worrisome for metastatic disease. 3. Bilateral sacral insufficiency fractures.   Electronically Signed   By: Richardean Sale M.D.   On: 05/16/2015 13:27    ASSESSMENT: Carcinoma poorly metastases to the retroperitoneal lymph node Recent PET scan has been reviewed  independently ,shows progressive disease  MEDICAL DECISION MAKING:  Appointment with GYN oncologist Regarding persistent lower abdominal pain and GI evaluation for colonoscopy Reevaluate patient after GYN oncology evaluation  Patient expressed understanding and was in agreement with this plan. She also understands that She can call clinic at any time with any questions, concerns, or complaints.    No matching staging information was found for the patient.  Forest Gleason, MD   06/04/2015 3:56 PM

## 2015-06-05 ENCOUNTER — Other Ambulatory Visit: Payer: Self-pay | Admitting: Internal Medicine

## 2015-06-05 DIAGNOSIS — Z1231 Encounter for screening mammogram for malignant neoplasm of breast: Secondary | ICD-10-CM

## 2015-06-05 DIAGNOSIS — C800 Disseminated malignant neoplasm, unspecified: Secondary | ICD-10-CM

## 2015-06-05 DIAGNOSIS — C569 Malignant neoplasm of unspecified ovary: Secondary | ICD-10-CM | POA: Insufficient documentation

## 2015-06-09 ENCOUNTER — Encounter: Payer: Self-pay | Admitting: Anesthesiology

## 2015-06-09 ENCOUNTER — Ambulatory Visit
Admission: RE | Admit: 2015-06-09 | Discharge: 2015-06-09 | Disposition: A | Discharge status: Home / Self Care | Payer: Medicare Other | Source: Ambulatory Visit | Attending: Gastroenterology | Admitting: Gastroenterology

## 2015-06-09 ENCOUNTER — Ambulatory Visit: Payer: Medicare Other | Admitting: Anesthesiology

## 2015-06-09 ENCOUNTER — Encounter
Admission: RE | Disposition: A | Discharge status: Home / Self Care | Payer: Self-pay | Source: Ambulatory Visit | Attending: Gastroenterology

## 2015-06-09 DIAGNOSIS — Z79899 Other long term (current) drug therapy: Secondary | ICD-10-CM | POA: Insufficient documentation

## 2015-06-09 DIAGNOSIS — Z7982 Long term (current) use of aspirin: Secondary | ICD-10-CM | POA: Diagnosis not present

## 2015-06-09 DIAGNOSIS — Z803 Family history of malignant neoplasm of breast: Secondary | ICD-10-CM | POA: Insufficient documentation

## 2015-06-09 DIAGNOSIS — Z8249 Family history of ischemic heart disease and other diseases of the circulatory system: Secondary | ICD-10-CM | POA: Insufficient documentation

## 2015-06-09 DIAGNOSIS — G629 Polyneuropathy, unspecified: Secondary | ICD-10-CM | POA: Diagnosis not present

## 2015-06-09 DIAGNOSIS — Z832 Family history of diseases of the blood and blood-forming organs and certain disorders involving the immune mechanism: Secondary | ICD-10-CM | POA: Insufficient documentation

## 2015-06-09 DIAGNOSIS — D123 Benign neoplasm of transverse colon: Secondary | ICD-10-CM | POA: Diagnosis not present

## 2015-06-09 DIAGNOSIS — J45909 Unspecified asthma, uncomplicated: Secondary | ICD-10-CM | POA: Insufficient documentation

## 2015-06-09 DIAGNOSIS — K5730 Diverticulosis of large intestine without perforation or abscess without bleeding: Secondary | ICD-10-CM | POA: Diagnosis not present

## 2015-06-09 DIAGNOSIS — G4730 Sleep apnea, unspecified: Secondary | ICD-10-CM | POA: Insufficient documentation

## 2015-06-09 DIAGNOSIS — Z8379 Family history of other diseases of the digestive system: Secondary | ICD-10-CM | POA: Diagnosis not present

## 2015-06-09 DIAGNOSIS — F329 Major depressive disorder, single episode, unspecified: Secondary | ICD-10-CM | POA: Insufficient documentation

## 2015-06-09 DIAGNOSIS — Z9889 Other specified postprocedural states: Secondary | ICD-10-CM | POA: Insufficient documentation

## 2015-06-09 DIAGNOSIS — Z9049 Acquired absence of other specified parts of digestive tract: Secondary | ICD-10-CM | POA: Diagnosis not present

## 2015-06-09 DIAGNOSIS — Z888 Allergy status to other drugs, medicaments and biological substances status: Secondary | ICD-10-CM | POA: Diagnosis not present

## 2015-06-09 DIAGNOSIS — Z780 Asymptomatic menopausal state: Secondary | ICD-10-CM | POA: Insufficient documentation

## 2015-06-09 DIAGNOSIS — R1032 Left lower quadrant pain: Secondary | ICD-10-CM | POA: Diagnosis present

## 2015-06-09 DIAGNOSIS — Z8619 Personal history of other infectious and parasitic diseases: Secondary | ICD-10-CM | POA: Diagnosis not present

## 2015-06-09 DIAGNOSIS — Z8543 Personal history of malignant neoplasm of ovary: Secondary | ICD-10-CM | POA: Insufficient documentation

## 2015-06-09 HISTORY — PX: COLONOSCOPY WITH PROPOFOL: SHX5780

## 2015-06-09 SURGERY — COLONOSCOPY WITH PROPOFOL
Anesthesia: General

## 2015-06-09 MED ORDER — PROPOFOL 10 MG/ML IV BOLUS
INTRAVENOUS | Status: DC | PRN
  Administered 2015-06-09: 50 mg via INTRAVENOUS
  Administered 2015-06-09: 20 mg via INTRAVENOUS
  Administered 2015-06-09: 50 mg via INTRAVENOUS

## 2015-06-09 MED ORDER — PROPOFOL INFUSION 10 MG/ML OPTIME
INTRAVENOUS | Status: DC | PRN
Start: 2015-06-09 — End: 2015-06-09
  Administered 2015-06-09: 100 ug/kg/min via INTRAVENOUS

## 2015-06-09 MED ORDER — FENTANYL CITRATE (PF) 100 MCG/2ML IJ SOLN
INTRAMUSCULAR | Status: AC
  Filled 2015-06-09: qty 2

## 2015-06-09 MED ORDER — LIDOCAINE HCL (CARDIAC) 20 MG/ML IV SOLN
INTRAVENOUS | Status: DC | PRN
  Administered 2015-06-09: 50 mg via INTRAVENOUS

## 2015-06-09 MED ORDER — SODIUM CHLORIDE 0.9 % IV SOLN: INTRAVENOUS | Status: DC

## 2015-06-09 MED ORDER — FENTANYL CITRATE (PF) 100 MCG/2ML IJ SOLN
50.0000 ug | Freq: Once | INTRAMUSCULAR | Status: AC
  Administered 2015-06-09: 50 ug via INTRAVENOUS

## 2015-06-09 MED ORDER — SODIUM CHLORIDE 0.9 % IV SOLN
INTRAVENOUS | Status: DC
  Administered 2015-06-09: 1000 mL via INTRAVENOUS
  Administered 2015-06-09: 11:00:00 via INTRAVENOUS

## 2015-06-09 NOTE — Op Note (Signed)
[facility] Patient Name: Artemisia Auvil Procedure Date: 06/09/2015 10:21 AM MRN: [id number] Account #: [id number] Date of Birth: October 03, 1934 Admit Type: Outpatient Age: 81 Room: [facility] ENDO ROOM 4 Gender: Female Note Status: Finalized Procedure:         Colonoscopy Indications:       Abdominal pain in the left lower quadrant Providers:         [name]. Candace Cruise, MD Referring MD:      Rusty Aus, MD (Referring MD) Medicines:         Monitored Anesthesia Care Complications:     No immediate complications. Procedure:         Pre-Anesthesia Assessment:                    - Prior to the procedure, a History and Physical was                     performed, and patient medications, allergies and                     sensitivities were reviewed. The patient's tolerance of                     previous anesthesia was reviewed.                    - The risks and benefits of the procedure and the sedation                     options and risks were discussed with the patient. All                     questions were answered and informed consent was obtained.                    - After reviewing the risks and benefits, the patient was                     deemed in satisfactory condition to undergo the procedure.                    After obtaining informed consent, the colonoscope was                     passed under direct vision. Throughout the procedure, the                     patient's blood pressure, pulse, and oxygen saturations                     were monitored continuously. The Colonoscope was                     introduced through the anus and advanced to the the cecum,                     identified by appendiceal orifice and ileocecal valve. The                     colonoscopy was technically difficult and complex due to                     restricted mobility of the colon. Successful completion of  the procedure was aided by using manual  pressure. The                     patient tolerated the procedure well. The quality of the                     bowel preparation was fair. Findings:      A few small-mouthed diverticula were found in the sigmoid colon.      A diminutive polyp was found in the transverse colon. The polyp was       sessile. Could not locate this polyp again to remove. Very difficult       getting around sigmoid even with pediscope.      The exam was otherwise without abnormality. Impression:        - Diverticulosis in the sigmoid colon.                    - One diminutive polyp in the transverse colon.                    - The examination was otherwise normal.                    - No specimens collected.                    - Suspect pt has adhesions. Recommendation:    - Discharge patient to home.                    - Repeat colonoscopy in 3 years for surveillance.                    - The findings and recommendations were discussed with the                     patient. Procedure Code(s): --- Professional ---                    [id number], Colonoscopy, flexible; diagnostic, including                     collection of specimen(s) by brushing or washing, when                     performed (separate procedure) Diagnosis Code(s): --- Professional ---                    D12.3, Benign neoplasm of transverse colon                    R10.32, Left lower quadrant pain                    K57.30, Diverticulosis of large intestine without                     perforation or abscess without bleeding CPT copyright 2014 American Medical Association. All rights reserved. The codes documented in this report are preliminary and upon coder review may  be revised to meet current compliance requirements. Hulen Luster, MD 06/09/2015 11:04:36 AM This report has been signed electronically. Number of Addenda: 0 Note Initiated On: 06/09/2015 10:21 AM Scope Withdrawal Time: 0 hours 6 minutes 48 seconds  Total Procedure Duration: 0 hours  24 minutes 14 seconds       [facility]

## 2015-06-09 NOTE — Anesthesia Postprocedure Evaluation (Signed)
  Anesthesia Post-op Note  Patient: Lisa Becker  Procedure(s) Performed: Procedure(s): COLONOSCOPY WITH PROPOFOL (N/A)  Anesthesia type:General  Patient location: PACU  Post pain: Pain level controlled  Post assessment: Post-op Vital signs reviewed, Patient's Cardiovascular Status Stable, Respiratory Function Stable, Patent Airway and No signs of Nausea or vomiting  Post vital signs: Reviewed and stable  Last Vitals:  Filed Vitals:   06/09/15 1140  BP: 109/50  Pulse: 51  Temp:   Resp: 14    Level of consciousness: awake, alert  and patient cooperative  Complications: No apparent anesthesia complications

## 2015-06-09 NOTE — Transfer of Care (Signed)
Immediate Anesthesia Transfer of Care Note  Patient: Lisa Becker  Procedure(s) Performed: Procedure(s): COLONOSCOPY WITH PROPOFOL (N/A)  Patient Location: Endoscopy Unit  Anesthesia Type:General  Level of Consciousness: awake  Airway & Oxygen Therapy: Patient Spontanous Breathing and Patient connected to nasal cannula oxygen  Post-op Assessment: Report given to RN  Post vital signs: Reviewed  Last Vitals:  Filed Vitals:   06/09/15 1104  BP: 106/53  Pulse: 70  Temp: 36 C  Resp: 16    Complications: No apparent anesthesia complications

## 2015-06-09 NOTE — H&P (Signed)
Primary Care Physician:  Rusty Aus., MD Primary Gastroenterologist:  Dr. Candace Cruise  Pre-Procedure History & Physical: HPI:  Lisa Becker is a 81 y.o. female is here for an colonoscopy.   Past Medical History  Diagnosis Date  . Hypertension   . Anemia   . Sleep apnea     CPAP in use q night   . Pulmonary hypertension     followed by Dr. Raul Del, Jefm Bryant, PFT's done at that last visit- mid April- 2015  . Shortness of breath   . Depression   . Nervous indigestion     uses one TUM, if this occurs (maybe once per month)    . Arthritis     lumbar stenosis  . Neuromuscular disorder     neuropathy hands, feet, legs   . Cancer 2010    ovarian, reoccurence - 2013, treatment completed through porta cath. 2014   . Asthma     Past Surgical History  Procedure Laterality Date  . Total hip arthroplasty  2013  . Knee surgery Left 2006  . Eus  08/20/2012    Procedure: UPPER ENDOSCOPIC ULTRASOUND (EUS) LINEAR;  Surgeon: Milus Banister, MD;  Location: [facility];  Service: Endoscopy;  Laterality: N/A;  radial/ linear   . Joint replacement Right     both knees have been replaced    . Tonsillectomy    . Abdominal hysterectomy    . Back surgery  2003    prior to 2003 episode, had back surgery (lumbar)- 1989  . Laparotomy  2013    for mass in abdomen   . Blepharoplasty      Prior to Admission medications   Medication Sig Start Date End Date Taking? Authorizing Provider  albuterol (PROVENTIL HFA;VENTOLIN HFA) 108 (90 BASE) MCG/ACT inhaler Inhale 2 puffs into the lungs every 6 (six) hours as needed for wheezing or shortness of breath.    Historical Provider, MD  Alpha-Lipoic Acid (LIPOIC ACID PO) Take 300 mg by mouth every evening.     Historical Provider, MD  aspirin EC 81 MG tablet Take 81 mg by mouth daily.    Historical Provider, MD  budesonide-formoterol (SYMBICORT) 160-4.5 MCG/ACT inhaler Inhale 2 puffs into the lungs 2 (two) times daily.    Historical Provider, MD  calcium  carbonate (TUMS - DOSED IN MG ELEMENTAL CALCIUM) 500 MG chewable tablet Chew 1 tablet by mouth daily.    Historical Provider, MD  Cholecalciferol (VITAMIN D3) 2000 UNITS TABS Take 2,000 Units by mouth daily with breakfast.     Historical Provider, MD  ciprofloxacin (CIPRO) 500 MG tablet Take 1 tablet (500 mg total) by mouth 2 (two) times daily. 05/31/15   Forest Gleason, MD  Coenzyme Q10-Fish Oil-Vit E (CO-Q 10 OMEGA-3 FISH OIL) CAPS Take 1 capsule by mouth 2 (two) times daily.     Historical Provider, MD  cyclobenzaprine (FLEXERIL) 10 MG tablet Take 10 mg by mouth 2 (two) times daily as needed for muscle spasms.    Historical Provider, MD  gabapentin (NEURONTIN) 100 MG capsule Take 300 mg by mouth 3 (three) times daily.     Historical Provider, MD  gabapentin (NEURONTIN) 300 MG capsule Take 300 mg by mouth 3 (three) times daily.    Historical Provider, MD  Iron-Vitamin C (VITRON-C PO) Take 1 tablet by mouth daily with breakfast.     Historical Provider, MD  Magnesium Oxide 250 MG TABS Take 1 tablet by mouth daily.    Historical Provider, MD  metroNIDAZOLE (FLAGYL) 500 MG tablet Take 1 tablet (500 mg total) by mouth 3 (three) times daily. 05/31/15   Forest Gleason, MD  nabumetone (RELAFEN) 500 MG tablet Take 500 mg by mouth 2 (two) times daily.    Historical Provider, MD  ondansetron (ZOFRAN) 4 MG tablet Take 4 mg by mouth every 6 (six) hours as needed for nausea.    Historical Provider, MD  OVER THE COUNTER MEDICATION Take 2 capsules by mouth daily. GSH-3P    Historical Provider, MD  oxyCODONE (ROXICODONE) 5 MG immediate release tablet Take 1 tablet (5 mg total) by mouth every 6 (six) hours as needed for moderate pain or severe pain. 05/30/15 05/29/16  Delman Kitten, MD  pramipexole (MIRAPEX) 0.25 MG tablet Take 0.25 mg by mouth at bedtime as needed and may repeat dose one time if needed (restless leg).     Historical Provider, MD  simvastatin (ZOCOR) 20 MG tablet Take 20 mg by mouth every evening.     Historical Provider, MD  torsemide (DEMADEX) 20 MG tablet Take 10 mg by mouth daily with breakfast.     Historical Provider, MD  triamterene-hydrochlorothiazide (MAXZIDE-25) 37.5-25 MG per tablet Take 2 tablets by mouth daily.    Historical Provider, MD  vitamin B-12 (CYANOCOBALAMIN) 1000 MCG tablet Take 1,000 mcg by mouth daily with breakfast.     Historical Provider, MD    Allergies as of 06/06/2015 - Review Complete 06/04/2015  Allergen Reaction Noted  . Amlodipine Swelling 05/02/2014  . Metoclopramide Other (See Comments) 05/02/2014  . Metronidazole Diarrhea 05/02/2014  . Tramadol hcl er Other (See Comments) 05/02/2014    Family History  Problem Relation Age of Onset  . Breast cancer Sister   . Breast cancer Maternal Aunt     History   Social History  . Marital Status: Married    Spouse Name: N/A  . Number of Children: N/A  . Years of Education: N/A   Occupational History  . Not on file.   Social History Main Topics  . Smoking status: Never Smoker   . Smokeless tobacco: Not on file  . Alcohol Use: No  . Drug Use: No  . Sexual Activity: Not on file   Other Topics Concern  . Not on file   Social History Narrative   Lives with husband who is not able to help her w/ ADL's; she is able to ambulate w/ rolling walker    Review of Systems: See HPI, otherwise negative ROS  Physical Exam: BP 136/52 mmHg  Pulse 63  Temp(Src) 98.8 F (37.1 C) (Tympanic)  Ht 5' (1.524 m)  Wt 99.791 kg (220 lb)  BMI 42.97 kg/m2  SpO2 100% General:   Alert,  pleasant and cooperative in NAD Head:  Normocephalic and atraumatic. Neck:  Supple; no masses or thyromegaly. Lungs:  Clear throughout to auscultation.    Heart:  Regular rate and rhythm. Abdomen:  Soft, nontender and nondistended. Normal bowel sounds, without guarding, and without rebound.   Neurologic:  Alert and  oriented x4;  grossly normal neurologically.  Impression/Plan: Lisa Becker is here for an colonoscopy to  be performed for LLQ abdominal pain.  Risks, benefits, limitations, and alternatives regarding colonoscopy have been reviewed with the patient.  Questions have been answered.  All parties agreeable.   [name], [name], MD  06/09/2015, 9:57 AM

## 2015-06-09 NOTE — Anesthesia Preprocedure Evaluation (Addendum)
Anesthesia Evaluation  Patient identified by MRN, date of birth, ID band Patient awake    Reviewed: Allergy & Precautions, H&P , NPO status , Patient's Chart, lab work & pertinent test results, reviewed documented beta blocker date and time   History of Anesthesia Complications Negative for: history of anesthetic complications  Airway Mallampati: III  TM Distance: >3 FB Neck ROM: limited    Dental  (+) Poor Dentition   Pulmonary shortness of breath, asthma , sleep apnea ,  breath sounds clear to auscultation  Pulmonary exam normal       Cardiovascular Exercise Tolerance: Good hypertension, - Past MI Normal cardiovascular examRhythm:regular Rate:Normal     Neuro/Psych PSYCHIATRIC DISORDERS Depression  Neuromuscular disease negative neurological ROS  negative psych ROS   GI/Hepatic negative GI ROS, Neg liver ROS,   Endo/Other  negative endocrine ROSMorbid obesity  Renal/GU negative Renal ROS  negative genitourinary   Musculoskeletal  (+) Arthritis -,   Abdominal   Peds  Hematology negative hematology ROS (+)   Anesthesia Other Findings Past Medical History:   Hypertension                                                 Anemia                                                       Sleep apnea                                                    Comment:CPAP in use q night    Pulmonary hypertension                                         Comment:followed by Dr. Herbert Seta, PFT's done               at that last visit- mid April- 2015   Shortness of breath                                          Depression                                                   Nervous indigestion                                            Comment:uses one TUM, if this occurs (maybe once per               month)     Arthritis  Comment:lumbar stenosis   Neuromuscular disorder                                          Comment:neuropathy hands, feet, legs    Cancer                                          2010           Comment:ovarian, reoccurence - 2013, treatment               completed through porta cath. 2014    Asthma                                Reproductive/Obstetrics negative OB ROS                            Anesthesia Physical Anesthesia Plan  ASA: III  Anesthesia Plan: General   Post-op Pain Management:    Induction:   Airway Management Planned:   Additional Equipment:   Intra-op Plan:   Post-operative Plan:   Informed Consent: I have reviewed the patients History and Physical, chart, labs and discussed the procedure including the risks, benefits and alternatives for the proposed anesthesia with the patient or authorized representative who has indicated his/her understanding and acceptance.   Dental Advisory Given  Plan Discussed with: Anesthesiologist, CRNA and Surgeon  Anesthesia Plan Comments:         Anesthesia Quick Evaluation

## 2015-06-12 ENCOUNTER — Encounter: Payer: Self-pay | Admitting: Gastroenterology

## 2015-06-14 ENCOUNTER — Inpatient Hospital Stay (HOSPITAL_BASED_OUTPATIENT_CLINIC_OR_DEPARTMENT_OTHER): Payer: Medicare Other | Admitting: Obstetrics and Gynecology

## 2015-06-14 ENCOUNTER — Inpatient Hospital Stay: Payer: Medicare Other | Attending: Oncology

## 2015-06-14 ENCOUNTER — Inpatient Hospital Stay: Payer: Medicare Other | Admitting: Oncology

## 2015-06-14 ENCOUNTER — Other Ambulatory Visit: Payer: Self-pay | Admitting: Family Medicine

## 2015-06-14 VITALS — BP 128/65 | HR 62 | Temp 97.5°F | Wt 219.2 lb

## 2015-06-14 DIAGNOSIS — Z9221 Personal history of antineoplastic chemotherapy: Secondary | ICD-10-CM

## 2015-06-14 DIAGNOSIS — C569 Malignant neoplasm of unspecified ovary: Secondary | ICD-10-CM

## 2015-06-14 DIAGNOSIS — Z90710 Acquired absence of both cervix and uterus: Secondary | ICD-10-CM | POA: Diagnosis not present

## 2015-06-14 DIAGNOSIS — C778 Secondary and unspecified malignant neoplasm of lymph nodes of multiple regions: Secondary | ICD-10-CM | POA: Insufficient documentation

## 2015-06-14 DIAGNOSIS — C549 Malignant neoplasm of corpus uteri, unspecified: Secondary | ICD-10-CM | POA: Insufficient documentation

## 2015-06-14 DIAGNOSIS — Z8543 Personal history of malignant neoplasm of ovary: Secondary | ICD-10-CM

## 2015-06-14 LAB — COMPREHENSIVE METABOLIC PANEL
ALT: 19 U/L (ref 14–54)
ANION GAP: 6 (ref 5–15)
AST: 23 U/L (ref 15–41)
Albumin: 3.6 g/dL (ref 3.5–5.0)
Alkaline Phosphatase: 60 U/L (ref 38–126)
BILIRUBIN TOTAL: 0.6 mg/dL (ref 0.3–1.2)
BUN: 24 mg/dL — AB (ref 6–20)
CHLORIDE: 96 mmol/L — AB (ref 101–111)
CO2: 32 mmol/L (ref 22–32)
CREATININE: 0.92 mg/dL (ref 0.44–1.00)
Calcium: 8.1 mg/dL — ABNORMAL LOW (ref 8.9–10.3)
GFR calc non Af Amer: 58 mL/min — ABNORMAL LOW (ref >60)
GLUCOSE: 151 mg/dL — AB (ref 65–99)
Potassium: 4.1 mmol/L (ref 3.5–5.1)
Sodium: 134 mmol/L — ABNORMAL LOW (ref 135–145)
Total Protein: 6.8 g/dL (ref 6.5–8.1)

## 2015-06-14 LAB — CBC WITH DIFFERENTIAL/PLATELET
BASOS ABS: 0 10*3/uL (ref 0–0.1)
Basophils Relative: 1 %
EOS ABS: 0.1 10*3/uL (ref 0–0.7)
EOS PCT: 2 %
HCT: 35.7 % (ref 35.0–47.0)
Hemoglobin: 11.7 g/dL — ABNORMAL LOW (ref 12.0–16.0)
LYMPHS ABS: 1.4 10*3/uL (ref 1.0–3.6)
LYMPHS PCT: 18 %
MCH: 29.3 pg (ref 26.0–34.0)
MCHC: 32.8 g/dL (ref 32.0–36.0)
MCV: 89.3 fL (ref 80.0–100.0)
Monocytes Absolute: 0.4 10*3/uL (ref 0.2–0.9)
Monocytes Relative: 5 %
NEUTROS ABS: 6.2 10*3/uL (ref 1.4–6.5)
Neutrophils Relative %: 76 %
Platelets: 233 10*3/uL (ref 150–440)
RBC: 3.99 MIL/uL (ref 3.80–5.20)
RDW: 16.3 % — AB (ref 11.5–14.5)
WBC: 8.2 10*3/uL (ref 3.6–11.0)

## 2015-06-14 NOTE — Progress Notes (Signed)
This encounter was created in error - please disregard.

## 2015-06-14 NOTE — Progress Notes (Signed)
Patient has a recent colonoscopy a.nd has surgical tomorrow with Dr. Tamala Julian

## 2015-06-14 NOTE — Progress Notes (Signed)
Gynecologic Oncology Interval Note  Referring Provider: Dr Ammie Dalton, Dr Oliva Bustard  Chief Concern: Ovarian cancer surveillance.  Subjective:  Lisa Becker is a 81 y.o. woman who presents today for continued surveillance for history of stage ovarian cancer.    Had some LLQ pain Colonoscopy 06/09/15: - Diverticulosis in the sigmoid colon. - One diminutive polyp in the transverse colon. - The examination was otherwise normal.   Some fixation of the colon was noted requiring pediatric scope to get through sigmoid.  Some diarrhea since the bowel prep.  CT scan 05/30/15: Again noted confluent retroperitoneal adenopathy axial image 47 just anterior to aorta measures 4.3 by 2.9 cm without change in size in appearance from prior exam. A portal caval lymph node again noted measures 1.4 cm stable.   Oncology Treatment History:  02/2009  TAH, BSO, omentectomy, small bowel resection.  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 CA125 was elevated at 95 after surgery. Carboplatin and palitaxel x 6, moderately well tolerated.    CA125 rose to 36.8 and in 09/2012 nodal recurrence, resection of high grade serous ovarian cancer, carboplatin and paclitaxel (allergy) and gemcitabine (myelosuppression) x 5 completed NED 03/2013  Lisa Becker was admitted to the hospital in fall 2014 due to a partial SBO, which resolved with conservative measures.  PET/CT scan in 5/15 was normal and CA125 was stable at 14. Scan results Interval surgical resection of hypermetabolic retroperitoneal lymph node in the aortocaval space since prior exam. One residual metastatic retroperitoneal lymph node in the abdominal retroperitoneum shows no significant change in size or hypermetabolic activity compared to prior exam. No new or progressive hypermetabolic disease identified. New 5 mm left lower lobe pulmonary nodule without hypermetabolic activity, although this is below PET  size threshold for detection of malignancy. Continued imaging followup by chest CT or PET scan recommended.   GYN History:  Gravida 3  Para 3  Age at Menarche 44  Age at Menopause 50  Regular Pap Smears Yes  Additional Hx no previous gyn problems   Problem List: Patient Active Problem List   Diagnosis Date Noted  . Asthma without status asthmaticus 05/31/2015  . Adult idiopathic generalized osteoporosis 04/12/2015  . Personal history of healed traumatic fracture 04/12/2015  . Septic shock 03/31/2015  . Sacral fracture 03/31/2015  . Ovarian cancer 03/31/2015  . UTI (lower urinary tract infection) 03/31/2015  . H/O ovarian cancer 11/25/2014  . SCC (squamous cell carcinoma), face 08/31/2014  . B12 deficiency 06/09/2014  . Disc disorder 06/09/2014  . Neuropathy involving both lower extremities 06/09/2014  . Lumbago 05/08/2014  . Spondylolisthesis of lumbar region 05/05/2014  . Obstructive apnea 03/18/2014  . Hypertensive pulmonary vascular disease 03/18/2014    Past Medical History: Past Medical History  Diagnosis Date  . Hypertension   . Anemia   . Sleep apnea     CPAP in use q night   . Pulmonary hypertension     followed by Dr. Raul Del, Jefm Bryant, PFT's done at that last visit- mid April- 2015  . Shortness of breath   . Depression   . Nervous indigestion     uses one TUM, if this occurs (maybe once per month)    . Arthritis     lumbar stenosis  . Neuromuscular disorder     neuropathy hands, feet, legs   . Cancer 2010    ovarian, reoccurence - 2013, treatment completed through porta cath. 2014   . Asthma  Past Surgical History: Past Surgical History  Procedure Laterality Date  . Total hip arthroplasty  2013  . Knee surgery Left 2006  . Eus  08/20/2012    Procedure: UPPER ENDOSCOPIC ULTRASOUND (EUS) LINEAR;  Surgeon: Milus Banister, MD;  Location: [facility];  Service: Endoscopy;  Laterality: N/A;  radial/ linear   . Joint replacement Right      both knees have been replaced    . Tonsillectomy    . Abdominal hysterectomy    . Back surgery  2003    prior to 2003 episode, had back surgery (lumbar)- 1989  . Laparotomy  2013    for mass in abdomen   . Blepharoplasty    . Colonoscopy with propofol N/A 06/09/2015    Procedure: COLONOSCOPY WITH PROPOFOL;  Surgeon: Hulen Luster, MD;  Location: [facility] END[facility];  Service: Gastroenterology;  Laterality: N/A;    Family History: Family History  Problem Relation Age of Onset  . Breast cancer Sister   . Breast cancer Maternal Aunt     Social History: History   Social History  . Marital Status: Married    Spouse Name: N/A  . Number of Children: N/A  . Years of Education: N/A   Occupational History  . Not on file.   Social History Main Topics  . Smoking status: Never Smoker   . Smokeless tobacco: Not on file  . Alcohol Use: No  . Drug Use: No  . Sexual Activity: Not on file   Other Topics Concern  . Not on file   Social History Narrative   Lives with husband who is not able to help her w/ ADL's; she is able to ambulate w/ rolling walker    Allergies: Allergies  Allergen Reactions  . Amlodipine Swelling    "leg swelling"  . Metoclopramide Other (See Comments)    "interferes with my pramipexole"  . Metronidazole Diarrhea    "upset stomach"  . Tramadol Hcl Er Other (See Comments)    "hold fluid"    Current Medications: Current Outpatient Prescriptions  Medication Sig Dispense Refill  . albuterol (PROVENTIL HFA;VENTOLIN HFA) 108 (90 BASE) MCG/ACT inhaler Inhale 2 puffs into the lungs every 6 (six) hours as needed for wheezing or shortness of breath.    . Alpha-Lipoic Acid (LIPOIC ACID PO) Take 300 mg by mouth every evening.     Marland Kitchen aspirin EC 81 MG tablet Take 81 mg by mouth daily.    . budesonide-formoterol (SYMBICORT) 160-4.5 MCG/ACT inhaler Inhale 2 puffs into the lungs 2 (two) times daily.    . calcium carbonate (TUMS - DOSED IN MG ELEMENTAL CALCIUM) 500 MG chewable  tablet Chew 1 tablet by mouth daily.    . Cholecalciferol (VITAMIN D3) 2000 UNITS TABS Take 2,000 Units by mouth daily with breakfast.     . ciprofloxacin (CIPRO) 500 MG tablet Take 1 tablet (500 mg total) by mouth 2 (two) times daily. 14 tablet 0  . Coenzyme Q10-Fish Oil-Vit E (CO-Q 10 OMEGA-3 FISH OIL) CAPS Take 1 capsule by mouth 2 (two) times daily.     . cyclobenzaprine (FLEXERIL) 10 MG tablet Take 10 mg by mouth 2 (two) times daily as needed for muscle spasms.    Marland Kitchen gabapentin (NEURONTIN) 100 MG capsule Take 300 mg by mouth 3 (three) times daily.     Marland Kitchen gabapentin (NEURONTIN) 300 MG capsule Take 300 mg by mouth 3 (three) times daily.    . Iron-Vitamin C (VITRON-C PO) Take 1 tablet by mouth  daily with breakfast.     . Magnesium Oxide 250 MG TABS Take 1 tablet by mouth daily.    . metroNIDAZOLE (FLAGYL) 500 MG tablet Take 1 tablet (500 mg total) by mouth 3 (three) times daily. 21 tablet 0  . nabumetone (RELAFEN) 500 MG tablet Take 500 mg by mouth 2 (two) times daily.    . ondansetron (ZOFRAN) 4 MG tablet Take 4 mg by mouth every 6 (six) hours as needed for nausea.    Marland Kitchen OVER THE COUNTER MEDICATION Take 2 capsules by mouth daily. GSH-3P    . oxyCODONE (ROXICODONE) 5 MG immediate release tablet Take 1 tablet (5 mg total) by mouth every 6 (six) hours as needed for moderate pain or severe pain. 4 tablet 0  . pramipexole (MIRAPEX) 0.25 MG tablet Take 0.25 mg by mouth at bedtime as needed and may repeat dose one time if needed (restless leg).     . simvastatin (ZOCOR) 20 MG tablet Take 20 mg by mouth every evening.    . torsemide (DEMADEX) 20 MG tablet Take 10 mg by mouth daily with breakfast.     . triamterene-hydrochlorothiazide (MAXZIDE-25) 37.5-25 MG per tablet Take 2 tablets by mouth daily.    . vitamin B-12 (CYANOCOBALAMIN) 1000 MCG tablet Take 1,000 mcg by mouth daily with breakfast.      No current facility-administered medications for this visit.   Facility-Administered Medications Ordered  in Other Visits  Medication Dose Route Frequency Provider Last Rate Last Dose  . sodium chloride 0.9 % injection 10 mL  10 mL Intravenous PRN Forest Gleason, MD   10 mL at 05/31/15 8299   Genetic Testing: negative in 2010 when she was first diagnosed   Review of Systems Pertinent items are noted in HPI.  Objective:  BP 128/65 mmHg  Pulse 62  Temp(Src) 97.5 F (36.4 C)  Wt 219 lb 4 oz (99.45 kg)   ECOG Performance Status: 1 - Symptomatic but completely ambulatory  General appearance: alert, cooperative and appears stated age HEENT:PERRLA, extra ocular movement intact, neck supple with midline trachea and thyroid without masses Lymph node survey: non-palpable, axillary, inguinal, supraclavicular Cardiovascular: regular rate and rhythm, no murmurs or gallops Respiratory: normal air entry, lungs clear to auscultation Breast exam: breasts appear normal, no suspicious masses, no skin or nipple changes or axillary nodes, unchanged from previous exams. Abdomen: soft, non-tender, without masses or organomegaly, no hernias and well healed incision Back: inspection of back is normal Extremities: extremities normal, atraumatic, no cyanosis or edema Skin exam - normal coloration and turgor, no rashes, no suspicious skin lesions noted. Neurological exam reveals alert, oriented, normal speech, no focal findings or movement disorder noted.  Pelvic: exam chaperoned by nurse;  Vulva: normal appearing vulva with no masses, tenderness or lesions; Vagina: normal vagina; Bimanual/RV: no masses or nodularity.   Lab Results  Component Value Date   CA125 15.9 05/18/2015   CA125 10.4 10/12/2014   CA125 12.0 08/24/2014    Assessment:  Lisa Becker is a 81 y.o. female with a history of stage III, high grade ovarian cancer since 2010 that was localized to primary tumor and this was extending to a loop of intestine that was resected.  She received carbo/taxo adjuvant therapy. History of nodal recurrence  in 2013 treated with resection and carbo/gem after taxol allergy developed.  Probable recurrent disease again in aortic and portacaval nodes.  Negative genetic testing.  Plan:   Problem List Items Addressed This Visit  Genitourinary   Ovarian cancer - Primary (Chronic)     I discussed the situation with Dr Oliva Bustard.  Options include debulking surgery to remove retroperitoneal nodal disease, but this would probably not be too feasible given the involvement of portacaval nodes.  She could have FNA of PA node and then chemotherapy for platinum sensitive disease (possibly carbo/doxil).  Expectant management would also be an option as she has a long disease free interval and her overall PS is only fair.   Also plans to see a general surgeon due to the fixation of her colon on colonoscopy, but do not think there is anything there that requires surgical management.  It may be due to prior surgeries.   Mellody Drown, MD  CC:  Rusty Aus, MD [address] [place], [place] [address] [phone]

## 2015-06-15 ENCOUNTER — Other Ambulatory Visit: Payer: Self-pay | Admitting: Surgery

## 2015-06-15 DIAGNOSIS — R1032 Left lower quadrant pain: Secondary | ICD-10-CM

## 2015-06-15 LAB — CA 125: CA 125: 12.4 U/mL (ref 0.0–38.1)

## 2015-06-20 ENCOUNTER — Other Ambulatory Visit: Payer: Self-pay | Admitting: Surgery

## 2015-06-20 ENCOUNTER — Ambulatory Visit
Admission: RE | Admit: 2015-06-20 | Discharge: 2015-06-20 | Disposition: A | Discharge status: Home / Self Care | Payer: Medicare Other | Source: Ambulatory Visit | Attending: Surgery | Admitting: Surgery

## 2015-06-20 DIAGNOSIS — Z96642 Presence of left artificial hip joint: Secondary | ICD-10-CM | POA: Insufficient documentation

## 2015-06-20 DIAGNOSIS — R1032 Left lower quadrant pain: Secondary | ICD-10-CM | POA: Diagnosis present

## 2015-06-20 DIAGNOSIS — K5730 Diverticulosis of large intestine without perforation or abscess without bleeding: Secondary | ICD-10-CM | POA: Insufficient documentation

## 2015-06-20 DIAGNOSIS — Z8543 Personal history of malignant neoplasm of ovary: Secondary | ICD-10-CM | POA: Insufficient documentation

## 2015-09-14 ENCOUNTER — Inpatient Hospital Stay: Payer: Medicare Other | Attending: Oncology | Admitting: Oncology

## 2015-09-14 ENCOUNTER — Encounter: Payer: Self-pay | Admitting: Oncology

## 2015-09-14 ENCOUNTER — Inpatient Hospital Stay: Payer: Medicare Other

## 2015-09-14 VITALS — BP 147/80 | HR 76 | Temp 96.8°F | Wt 219.4 lb

## 2015-09-14 DIAGNOSIS — R5383 Other fatigue: Secondary | ICD-10-CM

## 2015-09-14 DIAGNOSIS — R0602 Shortness of breath: Secondary | ICD-10-CM

## 2015-09-14 DIAGNOSIS — Z803 Family history of malignant neoplasm of breast: Secondary | ICD-10-CM | POA: Insufficient documentation

## 2015-09-14 DIAGNOSIS — I10 Essential (primary) hypertension: Secondary | ICD-10-CM | POA: Diagnosis not present

## 2015-09-14 DIAGNOSIS — C7800 Secondary malignant neoplasm of unspecified lung: Secondary | ICD-10-CM | POA: Insufficient documentation

## 2015-09-14 DIAGNOSIS — Z8543 Personal history of malignant neoplasm of ovary: Secondary | ICD-10-CM

## 2015-09-14 DIAGNOSIS — R140 Abdominal distension (gaseous): Secondary | ICD-10-CM

## 2015-09-14 DIAGNOSIS — K5900 Constipation, unspecified: Secondary | ICD-10-CM | POA: Diagnosis not present

## 2015-09-14 DIAGNOSIS — R630 Anorexia: Secondary | ICD-10-CM

## 2015-09-14 DIAGNOSIS — R1900 Intra-abdominal and pelvic swelling, mass and lump, unspecified site: Secondary | ICD-10-CM

## 2015-09-14 DIAGNOSIS — C801 Malignant (primary) neoplasm, unspecified: Secondary | ICD-10-CM

## 2015-09-14 DIAGNOSIS — J45909 Unspecified asthma, uncomplicated: Secondary | ICD-10-CM

## 2015-09-14 DIAGNOSIS — R1084 Generalized abdominal pain: Secondary | ICD-10-CM

## 2015-09-14 DIAGNOSIS — C569 Malignant neoplasm of unspecified ovary: Secondary | ICD-10-CM

## 2015-09-14 DIAGNOSIS — R531 Weakness: Secondary | ICD-10-CM

## 2015-09-14 DIAGNOSIS — G4730 Sleep apnea, unspecified: Secondary | ICD-10-CM

## 2015-09-14 DIAGNOSIS — Z79899 Other long term (current) drug therapy: Secondary | ICD-10-CM | POA: Insufficient documentation

## 2015-09-14 DIAGNOSIS — Z23 Encounter for immunization: Secondary | ICD-10-CM | POA: Diagnosis not present

## 2015-09-14 DIAGNOSIS — R1032 Left lower quadrant pain: Secondary | ICD-10-CM

## 2015-09-14 DIAGNOSIS — Z9221 Personal history of antineoplastic chemotherapy: Secondary | ICD-10-CM | POA: Diagnosis not present

## 2015-09-14 DIAGNOSIS — Z7982 Long term (current) use of aspirin: Secondary | ICD-10-CM | POA: Diagnosis not present

## 2015-09-14 DIAGNOSIS — Z9989 Dependence on other enabling machines and devices: Secondary | ICD-10-CM | POA: Diagnosis not present

## 2015-09-14 LAB — COMPREHENSIVE METABOLIC PANEL
ALBUMIN: 3.7 g/dL (ref 3.5–5.0)
ALK PHOS: 66 U/L (ref 38–126)
ALT: 22 U/L (ref 14–54)
ANION GAP: 10 (ref 5–15)
AST: 30 U/L (ref 15–41)
BILIRUBIN TOTAL: 0.5 mg/dL (ref 0.3–1.2)
BUN: 27 mg/dL — AB (ref 6–20)
CO2: 29 mmol/L (ref 22–32)
Calcium: 8.2 mg/dL — ABNORMAL LOW (ref 8.9–10.3)
Chloride: 94 mmol/L — ABNORMAL LOW (ref 101–111)
Creatinine, Ser: 1.14 mg/dL — ABNORMAL HIGH (ref 0.44–1.00)
GFR calc Af Amer: 52 mL/min — ABNORMAL LOW (ref >60)
GFR calc non Af Amer: 45 mL/min — ABNORMAL LOW (ref >60)
GLUCOSE: 96 mg/dL (ref 65–99)
Potassium: 3.5 mmol/L (ref 3.5–5.1)
SODIUM: 133 mmol/L — AB (ref 135–145)
TOTAL PROTEIN: 7.4 g/dL (ref 6.5–8.1)

## 2015-09-14 LAB — CBC WITH DIFFERENTIAL/PLATELET
BASOS ABS: 0.1 10*3/uL (ref 0–0.1)
BASOS PCT: 1 %
Eosinophils Absolute: 0.3 10*3/uL (ref 0–0.7)
Eosinophils Relative: 3 %
HEMATOCRIT: 35.3 % (ref 35.0–47.0)
Hemoglobin: 11.9 g/dL — ABNORMAL LOW (ref 12.0–16.0)
LYMPHS PCT: 23 %
Lymphs Abs: 1.9 10*3/uL (ref 1.0–3.6)
MCH: 29.6 pg (ref 26.0–34.0)
MCHC: 33.8 g/dL (ref 32.0–36.0)
MCV: 87.3 fL (ref 80.0–100.0)
MONO ABS: 0.6 10*3/uL (ref 0.2–0.9)
MONOS PCT: 7 %
NEUTROS ABS: 5.4 10*3/uL (ref 1.4–6.5)
Neutrophils Relative %: 66 %
Platelets: 240 10*3/uL (ref 150–440)
RBC: 4.04 MIL/uL (ref 3.80–5.20)
RDW: 14.3 % (ref 11.5–14.5)
WBC: 8.2 10*3/uL (ref 3.6–11.0)

## 2015-09-14 MED ORDER — INFLUENZA VAC SPLIT QUAD 0.5 ML IM SUSY
0.5000 mL | PREFILLED_SYRINGE | Freq: Once | INTRAMUSCULAR | Status: AC
  Administered 2015-09-14: 0.5 mL via INTRAMUSCULAR

## 2015-09-14 MED ORDER — SODIUM CHLORIDE 0.9 % IJ SOLN
10.0000 mL | INTRAMUSCULAR | Status: DC | PRN
  Administered 2015-09-14: 10 mL via INTRAVENOUS
  Filled 2015-09-14: qty 10

## 2015-09-14 MED ORDER — HEPARIN SOD (PORK) LOCK FLUSH 100 UNIT/ML IV SOLN
500.0000 [IU] | Freq: Once | INTRAVENOUS | Status: AC
  Administered 2015-09-14: 500 [IU] via INTRAVENOUS

## 2015-09-14 NOTE — Progress Notes (Signed)
Patient states she gets extremely SOB when doing anything at all.  States her abdomen feels bloated.  O2 saturation 95%.

## 2015-09-14 NOTE — Progress Notes (Signed)
[place] @ [facility] [phone]) [phone]  Fax:(336) [place]: April 20, 1934  MR#: [id number]  BPJ#:[id number]  Patient Care Team: Rusty Aus, MD as PCP - General (Internal Medicine)  CHIEF COMPLAINT:  Chief Complaint  Patient presents with  . OTHER    Oncology History   02/2009  Adenocarcinoma ovary with SB involvement             adequate TRS             Carboplatin and palitaxel x 6, moderately well tolerated, last treatment in August, clinically NED 09/2012 nodal recurrence,  resection,              carboplatin and paclitaxel (allergy) and gemcitabine (myelosuppression) x 5 completed NED 03/2013 CT scan and PET scan is consistent with retroperitoneal lymph node suspicious for metastatic disease     Ovarian cancer ([place])    H/O ovarian cancer   11/25/2014 Initial Diagnosis H/O ovarian cancer    Oncology Flowsheet 05/11/2014 05/11/2014 05/12/2014 03/30/2015 03/31/2015 04/04/2015 05/30/2015  dexamethasone (DECADRON) IV - - - - - - -  dexamethasone (DECADRON) PO - - - - - - -  diazepam (VALIUM) IV - - - - 1 mg - -  diazepam (VALIUM) PO 5 mg 5 mg 5 mg - - - -  ondansetron (ZOFRAN) IJ - - - - - - -  ondansetron (ZOFRAN) IM - - - - - - -  ondansetron (ZOFRAN) IV - - - 4 mg 4 mg 4 mg 4 mg    INTERVAL HISTORY:  81 year old lady presented to emergency room with increasing left lower quadrant pain.   Patient came today complaining of increasing abdominal discomfort.  Increasing abdominal swelling.Marland Kitchen Poor appetite.  No nausea.  Increasing constipation. Discomfort is in lower abdominal area.  Patient had a colonoscopy done    REVIEW OF SYSTEMS:   Gen. status: Patient is feeling weak and tired. Lungs: No cough or shortness of breath.  GI: Left lower quadrant abdominal pain as described in interval history. Cardiac: No chest pain.  GU: No dysuria hematuria urine analysis was clear culture is pending lower extremity swelling.. Neurological system: No headache no  dizziness. Skin: No rash As per HPI. Otherwise, a complete review of systems is negatve.  PAST MEDICAL HISTORY: Past Medical History  Diagnosis Date  . Hypertension   . Anemia   . Sleep apnea     CPAP in use q night   . Pulmonary hypertension ([place])     followed by Dr. Raul Del, Jefm Bryant, PFT's done at that last visit- mid April- 2015  . Shortness of breath   . Depression   . Nervous indigestion     uses one TUM, if this occurs (maybe once per month)    . Arthritis     lumbar stenosis  . Neuromuscular disorder (HCC)     neuropathy hands, feet, legs   . Cancer ([place]) 2010    ovarian, reoccurence - 2013, treatment completed through porta cath. 2014   . Asthma     PAST SURGICAL HISTORY: Past Surgical History  Procedure Laterality Date  . Total hip arthroplasty  2013  . Knee surgery Left 2006  . Eus  08/20/2012    Procedure: UPPER ENDOSCOPIC ULTRASOUND (EUS) LINEAR;  Surgeon: Milus Banister, MD;  Location: [facility] ENDOSCOPY;  Service: Endoscopy;  Laterality: N/A;  radial/ linear   . Joint replacement Right     both knees have been replaced    .  Tonsillectomy    . Abdominal hysterectomy    . Back surgery  2003    prior to 2003 episode, had back surgery (lumbar)- 1989  . Laparotomy  2013    for mass in abdomen   . Blepharoplasty    . Colonoscopy with propofol N/A 06/09/2015    Procedure: COLONOSCOPY WITH PROPOFOL;  Surgeon: Hulen Luster, MD;  Location: [facility] ENDOSCOPY;  Service: Gastroenterology;  Laterality: N/A;    FAMILY HISTORY Family History  Problem Relation Age of Onset  . Breast cancer Sister   . Breast cancer Maternal Aunt     ADVANCED DIRECTIVES:  Patient does have advance healthcare directive, Patient   does not desire to make any changes HEALTH MAINTENANCE: Social History  Substance Use Topics  . Smoking status: Never Smoker   . Smokeless tobacco: None  . Alcohol Use: No      Allergies  Allergen Reactions  . Amlodipine Swelling    "leg swelling"  .  Metoclopramide Other (See Comments)    "interferes with my pramipexole"  . Metronidazole Diarrhea    "upset stomach"  . Tramadol Hcl Er Other (See Comments)    "hold fluid"    Current Outpatient Prescriptions  Medication Sig Dispense Refill  . albuterol (PROVENTIL HFA;VENTOLIN HFA) 108 (90 BASE) MCG/ACT inhaler Inhale 2 puffs into the lungs every 6 (six) hours as needed for wheezing or shortness of breath.    . Alpha-Lipoic Acid (LIPOIC ACID PO) Take 300 mg by mouth every evening.     Marland Kitchen aspirin EC 81 MG tablet Take 81 mg by mouth daily.    . budesonide-formoterol (SYMBICORT) 160-4.5 MCG/ACT inhaler Inhale 2 puffs into the lungs 2 (two) times daily.    . calcium carbonate (TUMS - DOSED IN MG ELEMENTAL CALCIUM) 500 MG chewable tablet Chew 1 tablet by mouth daily.    . Cholecalciferol (VITAMIN D3) 2000 UNITS TABS Take 2,000 Units by mouth daily with breakfast.     . Coenzyme Q10-Fish Oil-Vit E (CO-Q 10 OMEGA-3 FISH OIL) CAPS Take 1 capsule by mouth 2 (two) times daily.     . cyclobenzaprine (FLEXERIL) 10 MG tablet Take 10 mg by mouth 2 (two) times daily as needed for muscle spasms.    Marland Kitchen gabapentin (NEURONTIN) 100 MG capsule Take 300 mg by mouth 3 (three) times daily.     Marland Kitchen gabapentin (NEURONTIN) 300 MG capsule Take 300 mg by mouth 3 (three) times daily.    . Iron-Vitamin C (VITRON-C PO) Take 1 tablet by mouth daily with breakfast.     . Magnesium Oxide 250 MG TABS Take 1 tablet by mouth daily.    . nabumetone (RELAFEN) 500 MG tablet Take 500 mg by mouth 2 (two) times daily.    . ondansetron (ZOFRAN) 4 MG tablet Take 4 mg by mouth every 6 (six) hours as needed for nausea.    Marland Kitchen OVER THE COUNTER MEDICATION Take 2 capsules by mouth daily. GSH-3P    . oxyCODONE (ROXICODONE) 5 MG immediate release tablet Take 1 tablet (5 mg total) by mouth every 6 (six) hours as needed for moderate pain or severe pain. 4 tablet 0  . pramipexole (MIRAPEX) 0.25 MG tablet Take 0.25 mg by mouth at bedtime as needed and  may repeat dose one time if needed (restless leg).     . simvastatin (ZOCOR) 20 MG tablet Take 20 mg by mouth every evening.    . torsemide (DEMADEX) 20 MG tablet Take 10 mg by mouth daily with  breakfast.     . triamterene-hydrochlorothiazide (MAXZIDE-25) 37.5-25 MG per tablet Take 2 tablets by mouth daily.    . vitamin B-12 (CYANOCOBALAMIN) 1000 MCG tablet Take 1,000 mcg by mouth daily with breakfast.      No current facility-administered medications for this visit.   Facility-Administered Medications Ordered in Other Visits  Medication Dose Route Frequency Provider Last Rate Last Dose  . sodium chloride 0.9 % injection 10 mL  10 mL Intravenous PRN Forest Gleason, MD   10 mL at 05/31/15 0912    OBJECTIVE:  Filed Vitals:   09/14/15 1059  BP: 147/80  Pulse: 76  Temp: 96.8 F (36 C)     Body mass index is 42.84 kg/(m^2).    ECOG FS:1 - Symptomatic but completely ambulatory  PHYSICAL EXAM: General  status: Performance status is good.  Patient has not lost significant weight moderately obese lady  HEENT: No evidence of stomatitis. Sclera and conjunctivae :: No jaundice.   pale looking. Lungs: Air  entry equal on both sides.  No rhonchi.  No rales.  Cardiac: Heart sounds are normal.  No pericardial rub.  No murmur. Lymphatic system: Cervical, axillary, inguinal, lymph nodes not palpable GI: Abdomen is soft.  No ascites.  Liver spleen not palpable.  No tenderness.  Bowel sounds are within normal limitThere is no tenderness in the left lower quadrant.  Bowel sounds are present.  No palpable mass.  Abdominal distention Lower extremity: No edema Neurological system: Higher functions, cranial nerves intact no evidence of peripheral neuropathy. Skin: No rash.  No ecchymosis.Marland Kitchen   LAB RESULTS:  No visits with results within 2 Day(s) from this visit. Latest known visit with results is:  Appointment on 06/14/2015  Component Date Value Ref Range Status  . WBC 06/14/2015 8.2  3.6 - 11.0 K/uL  Final  . RBC 06/14/2015 3.99  3.80 - 5.20 MIL/uL Final  . Hemoglobin 06/14/2015 11.7* 12.0 - 16.0 g/dL Final  . HCT 06/14/2015 35.7  35.0 - 47.0 % Final  . MCV 06/14/2015 89.3  80.0 - 100.0 fL Final  . MCH 06/14/2015 29.3  26.0 - 34.0 pg Final  . MCHC 06/14/2015 32.8  32.0 - 36.0 g/dL Final  . RDW 06/14/2015 16.3* 11.5 - 14.5 % Final  . Platelets 06/14/2015 233  150 - 440 K/uL Final  . Neutrophils Relative % 06/14/2015 76   Final  . Neutro Abs 06/14/2015 6.2  1.4 - 6.5 K/uL Final  . Lymphocytes Relative 06/14/2015 18   Final  . Lymphs Abs 06/14/2015 1.4  1.0 - 3.6 K/uL Final  . Monocytes Relative 06/14/2015 5   Final  . Monocytes Absolute 06/14/2015 0.4  0.2 - 0.9 K/uL Final  . Eosinophils Relative 06/14/2015 2   Final  . Eosinophils Absolute 06/14/2015 0.1  0 - 0.7 K/uL Final  . Basophils Relative 06/14/2015 1   Final  . Basophils Absolute 06/14/2015 0.0  0 - 0.1 K/uL Final  . Sodium 06/14/2015 134* 135 - 145 mmol/L Final  . Potassium 06/14/2015 4.1  3.5 - 5.1 mmol/L Final  . Chloride 06/14/2015 96* 101 - 111 mmol/L Final  . CO2 06/14/2015 32  22 - 32 mmol/L Final  . Glucose, Bld 06/14/2015 151* 65 - 99 mg/dL Final  . BUN 06/14/2015 24* 6 - 20 mg/dL Final  . Creatinine, Ser 06/14/2015 0.92  0.44 - 1.00 mg/dL Final  . Calcium 06/14/2015 8.1* 8.9 - 10.3 mg/dL Final  . Total Protein 06/14/2015 6.8  6.5 - 8.1  g/dL Final  . Albumin 06/14/2015 3.6  3.5 - 5.0 g/dL Final  . AST 06/14/2015 23  15 - 41 U/L Final  . ALT 06/14/2015 19  14 - 54 U/L Final  . Alkaline Phosphatase 06/14/2015 60  38 - 126 U/L Final  . Total Bilirubin 06/14/2015 0.6  0.3 - 1.2 mg/dL Final  . GFR calc non Af Amer 06/14/2015 58* >60 mL/min Final  . GFR calc Af Amer 06/14/2015 >60  >60 mL/min Final   Comment: (NOTE) The eGFR has been calculated using the CKD EPI equation. This calculation has not been validated in all clinical situations. eGFR's persistently <60 mL/min signify possible Chronic Kidney Disease.     . Anion gap 06/14/2015 6  5 - 15 Final  . CA 125 06/14/2015 12.4  0.0 - 38.1 U/mL Final   Comment: (NOTE) Roche ECLIA methodology Performed At: [facility] [place], [place] [id number] Lindon Romp MD [id number]       ASSESSMENT: History of recurrent carcinoma of ovary Has increasing abdominal distention and discomfort and pain CT scan of abdomen pelvis has been ordered Evaluate patient after CT scan is available Negative colonoscopy done recently  .    No matching staging information was found for the patient.  Forest Gleason, MD   09/14/2015 11:20 AM

## 2015-09-15 LAB — CA 125: CA 125: 13.1 U/mL (ref 0.0–38.1)

## 2015-09-16 ENCOUNTER — Encounter: Payer: Self-pay | Admitting: Oncology

## 2015-09-18 ENCOUNTER — Ambulatory Visit
Admission: RE | Admit: 2015-09-18 | Discharge: 2015-09-18 | Disposition: A | Discharge status: Home / Self Care | Payer: Medicare Other | Source: Ambulatory Visit | Attending: Internal Medicine | Admitting: Internal Medicine

## 2015-09-18 ENCOUNTER — Other Ambulatory Visit: Payer: Self-pay | Admitting: Internal Medicine

## 2015-09-18 DIAGNOSIS — Z1231 Encounter for screening mammogram for malignant neoplasm of breast: Secondary | ICD-10-CM

## 2015-09-19 ENCOUNTER — Ambulatory Visit: Payer: Medicare Other

## 2015-09-21 ENCOUNTER — Other Ambulatory Visit: Payer: Self-pay | Admitting: *Deleted

## 2015-09-21 ENCOUNTER — Ambulatory Visit
Admission: RE | Admit: 2015-09-21 | Discharge: 2015-09-21 | Disposition: A | Discharge status: Home / Self Care | Payer: Medicare Other | Source: Ambulatory Visit | Attending: Oncology | Admitting: Oncology

## 2015-09-21 DIAGNOSIS — C800 Disseminated malignant neoplasm, unspecified: Secondary | ICD-10-CM

## 2015-09-21 DIAGNOSIS — R590 Localized enlarged lymph nodes: Secondary | ICD-10-CM | POA: Diagnosis not present

## 2015-09-21 DIAGNOSIS — C569 Malignant neoplasm of unspecified ovary: Secondary | ICD-10-CM | POA: Insufficient documentation

## 2015-09-21 DIAGNOSIS — R140 Abdominal distension (gaseous): Secondary | ICD-10-CM

## 2015-09-21 DIAGNOSIS — R1084 Generalized abdominal pain: Secondary | ICD-10-CM | POA: Diagnosis not present

## 2015-09-21 DIAGNOSIS — R918 Other nonspecific abnormal finding of lung field: Secondary | ICD-10-CM | POA: Insufficient documentation

## 2015-09-21 DIAGNOSIS — R0602 Shortness of breath: Secondary | ICD-10-CM

## 2015-09-21 MED ORDER — IOHEXOL 300 MG/ML  SOLN: 75.0000 mL | Freq: Once | INTRAMUSCULAR | Status: DC | PRN

## 2015-09-25 ENCOUNTER — Inpatient Hospital Stay (HOSPITAL_BASED_OUTPATIENT_CLINIC_OR_DEPARTMENT_OTHER): Payer: Medicare Other | Admitting: Oncology

## 2015-09-25 ENCOUNTER — Encounter: Payer: Self-pay | Admitting: Oncology

## 2015-09-25 VITALS — BP 149/75 | HR 73 | Temp 96.8°F | Wt 218.9 lb

## 2015-09-25 DIAGNOSIS — Z9221 Personal history of antineoplastic chemotherapy: Secondary | ICD-10-CM | POA: Diagnosis not present

## 2015-09-25 DIAGNOSIS — Z8543 Personal history of malignant neoplasm of ovary: Secondary | ICD-10-CM | POA: Diagnosis not present

## 2015-09-25 DIAGNOSIS — Z79899 Other long term (current) drug therapy: Secondary | ICD-10-CM

## 2015-09-25 DIAGNOSIS — C569 Malignant neoplasm of unspecified ovary: Secondary | ICD-10-CM

## 2015-09-25 DIAGNOSIS — C7800 Secondary malignant neoplasm of unspecified lung: Secondary | ICD-10-CM

## 2015-09-25 DIAGNOSIS — K5900 Constipation, unspecified: Secondary | ICD-10-CM

## 2015-09-25 DIAGNOSIS — I10 Essential (primary) hypertension: Secondary | ICD-10-CM

## 2015-09-25 DIAGNOSIS — R1900 Intra-abdominal and pelvic swelling, mass and lump, unspecified site: Secondary | ICD-10-CM

## 2015-09-25 DIAGNOSIS — R5383 Other fatigue: Secondary | ICD-10-CM

## 2015-09-25 DIAGNOSIS — R630 Anorexia: Secondary | ICD-10-CM

## 2015-09-25 DIAGNOSIS — R531 Weakness: Secondary | ICD-10-CM

## 2015-09-25 DIAGNOSIS — R1032 Left lower quadrant pain: Secondary | ICD-10-CM

## 2015-09-25 NOTE — Progress Notes (Signed)
Patient here today for results.  

## 2015-09-25 NOTE — Progress Notes (Signed)
[place] @ [facility] [phone]) [phone]  Fax:(336) [place]: 01-Jun-1934  MR#: [id number]  BEM#:[id number]  Patient Care Team: Rusty Aus, MD as PCP - General (Internal Medicine)  CHIEF COMPLAINT:  Chief Complaint  Patient presents with  . OTHER    Oncology History   02/2009  Adenocarcinoma ovary with SB involvement             adequate TRS             Carboplatin and palitaxel x 6, moderately well tolerated, last treatment in August, clinically NED 09/2012 nodal recurrence,  resection,              carboplatin and paclitaxel (allergy) and gemcitabine (myelosuppression) x 5 completed NED 03/2013 CT scan and PET scan is consistent with retroperitoneal lymph node suspicious for metastatic disease     Ovarian cancer ([place])    H/O ovarian cancer   11/25/2014 Initial Diagnosis H/O ovarian cancer    Oncology Flowsheet 05/11/2014 05/11/2014 05/12/2014 03/30/2015 03/31/2015 04/04/2015 05/30/2015  dexamethasone (DECADRON) IV - - - - - - -  dexamethasone (DECADRON) PO - - - - - - -  diazepam (VALIUM) IV - - - - 1 mg - -  diazepam (VALIUM) PO 5 mg 5 mg 5 mg - - - -  ondansetron (ZOFRAN) IJ - - - - - - -  ondansetron (ZOFRAN) IM - - - - - - -  ondansetron (ZOFRAN) IV - - - 4 mg 4 mg 4 mg 4 mg    INTERVAL HISTORY:  81 year old lady presented to emergency room with increasing left lower quadrant pain.   Patient came today complaining of increasing abdominal discomfort.  Increasing abdominal swelling.Marland Kitchen Poor appetite.  No nausea.  Increasing constipation. Discomfort is in lower abdominal area.  Patient had a colonoscopy done Patient had down CT scan of chest and abdomen According to her abdominal discomfort is somewhat improved.  Shortness of breath on exertion continues.    REVIEW OF SYSTEMS:   Gen. status: Patient is feeling weak and tired. Lungs: No cough or shortness of breath.  GI: Left lower quadrant abdominal pain as described in interval history. Cardiac:  No chest pain.  GU: No dysuria hematuria urine analysis was clear culture is pending lower extremity swelling.. Neurological system: No headache no dizziness. Skin: No rash As per HPI. Otherwise, a complete review of systems is negatve.  PAST MEDICAL HISTORY: Past Medical History  Diagnosis Date  . Hypertension   . Anemia   . Sleep apnea     CPAP in use q night   . Pulmonary hypertension ([place])     followed by Dr. Raul Del, Jefm Bryant, PFT's done at that last visit- mid April- 2015  . Shortness of breath   . Depression   . Nervous indigestion     uses one TUM, if this occurs (maybe once per month)    . Arthritis     lumbar stenosis  . Neuromuscular disorder (H[facility])     neuropathy hands, feet, legs   . Cancer ([place]) 2010    ovarian, reoccurence - 2013, treatment completed through porta cath. 2014   . Asthma     PAST SURGICAL HISTORY: Past Surgical History  Procedure Laterality Date  . Total hip arthroplasty  2013  . Knee surgery Left 2006  . Eus  08/20/2012    Procedure: UPPER ENDOSCOPIC ULTRASOUND (EUS) LINEAR;  Surgeon: Milus Banister, MD;  Location: [facility] ENDOSCOPY;  Service: Endoscopy;  Laterality: N/A;  radial/ linear   . Joint replacement Right     both knees have been replaced    . Tonsillectomy    . Abdominal hysterectomy    . Back surgery  2003    prior to 2003 episode, had back surgery (lumbar)- 1989  . Laparotomy  2013    for mass in abdomen   . Blepharoplasty    . Colonoscopy with propofol N/A 06/09/2015    Procedure: COLONOSCOPY WITH PROPOFOL;  Surgeon: Hulen Luster, MD;  Location: [facility] ENDOSCOPY;  Service: Gastroenterology;  Laterality: N/A;    FAMILY HISTORY Family History  Problem Relation Age of Onset  . Breast cancer Sister 76  . Breast cancer Maternal Aunt     ADVANCED DIRECTIVES:  Patient does have advance healthcare directive, Patient   does not desire to make any changes HEALTH MAINTENANCE: Social History  Substance Use Topics  . Smoking status:  Never Smoker   . Smokeless tobacco: Not on file  . Alcohol Use: No      Allergies  Allergen Reactions  . Amlodipine Swelling    "leg swelling"  . Metoclopramide Other (See Comments)    "interferes with my pramipexole"  . Metronidazole Diarrhea    "upset stomach"  . Tramadol Hcl Er Other (See Comments)    "hold fluid"    Current Outpatient Prescriptions  Medication Sig Dispense Refill  . albuterol (PROVENTIL HFA;VENTOLIN HFA) 108 (90 BASE) MCG/ACT inhaler Inhale 2 puffs into the lungs every 6 (six) hours as needed for wheezing or shortness of breath.    . Alpha-Lipoic Acid (LIPOIC ACID PO) Take 300 mg by mouth every evening.     Marland Kitchen aspirin EC 81 MG tablet Take 81 mg by mouth daily.    . budesonide-formoterol (SYMBICORT) 160-4.5 MCG/ACT inhaler Inhale 2 puffs into the lungs 2 (two) times daily.    . calcium carbonate (TUMS - DOSED IN MG ELEMENTAL CALCIUM) 500 MG chewable tablet Chew 1 tablet by mouth daily.    . Cholecalciferol (VITAMIN D3) 2000 UNITS TABS Take 2,000 Units by mouth daily with breakfast.     . Coenzyme Q10-Fish Oil-Vit E (CO-Q 10 OMEGA-3 FISH OIL) CAPS Take 1 capsule by mouth 2 (two) times daily.     . cyclobenzaprine (FLEXERIL) 10 MG tablet Take 10 mg by mouth 2 (two) times daily as needed for muscle spasms.    Marland Kitchen gabapentin (NEURONTIN) 100 MG capsule Take 300 mg by mouth 3 (three) times daily.     Marland Kitchen gabapentin (NEURONTIN) 300 MG capsule Take 300 mg by mouth 3 (three) times daily.    . Iron-Vitamin C (VITRON-C PO) Take 1 tablet by mouth daily with breakfast.     . Magnesium Oxide 250 MG TABS Take 1 tablet by mouth daily.    . nabumetone (RELAFEN) 500 MG tablet Take 500 mg by mouth 2 (two) times daily.    . ondansetron (ZOFRAN) 4 MG tablet Take 4 mg by mouth every 6 (six) hours as needed for nausea.    Marland Kitchen OVER THE COUNTER MEDICATION Take 2 capsules by mouth daily. GSH-3P    . oxyCODONE (ROXICODONE) 5 MG immediate release tablet Take 1 tablet (5 mg total) by mouth every 6  (six) hours as needed for moderate pain or severe pain. 4 tablet 0  . PARoxetine (PAXIL) 20 MG tablet Take 20 mg by mouth daily.    . pramipexole (MIRAPEX) 0.25 MG tablet Take 0.25 mg by mouth at bedtime as  needed and may repeat dose one time if needed (restless leg).     . simvastatin (ZOCOR) 20 MG tablet Take 20 mg by mouth every evening.    . torsemide (DEMADEX) 20 MG tablet Take 10 mg by mouth daily with breakfast.     . triamterene-hydrochlorothiazide (MAXZIDE-25) 37.5-25 MG per tablet Take 2 tablets by mouth daily.    . vitamin B-12 (CYANOCOBALAMIN) 1000 MCG tablet Take 1,000 mcg by mouth daily with breakfast.      No current facility-administered medications for this visit.   Facility-Administered Medications Ordered in Other Visits  Medication Dose Route Frequency Provider Last Rate Last Dose  . sodium chloride 0.9 % injection 10 mL  10 mL Intravenous PRN Forest Gleason, MD   10 mL at 05/31/15 0912    OBJECTIVE:  Filed Vitals:   09/25/15 1358  BP: 149/75  Pulse: 73  Temp: 96.8 F (36 C)     Body mass index is 42.75 kg/(m^2).    ECOG FS:1 - Symptomatic but completely ambulatory  PHYSICAL EXAM: General  status: Performance status is good.  Patient has not lost significant weight moderately obese lady  HEENT: No evidence of stomatitis. Sclera and conjunctivae :: No jaundice.   pale looking. Lungs: Air  entry equal on both sides.  No rhonchi.  No rales.  Cardiac: Heart sounds are normal.  No pericardial rub.  No murmur. Lymphatic system: Cervical, axillary, inguinal, lymph nodes not palpable GI: Abdomen is soft.  No ascites.  Liver spleen not palpable.  No tenderness.  Bowel sounds are within normal limitThere is no tenderness in the left lower quadrant.  Bowel sounds are present.  No palpable mass.  Abdominal distention Lower extremity: No edema Neurological system: Higher functions, cranial nerves intact no evidence of peripheral neuropathy. Skin: No rash.  No  ecchymosis.Marland Kitchen   LAB RESULTS:  No visits with results within 2 Day(s) from this visit. Latest known visit with results is:  Infusion on 09/14/2015  Component Date Value Ref Range Status  . Sodium 09/14/2015 133* 135 - 145 mmol/L Final  . Potassium 09/14/2015 3.5  3.5 - 5.1 mmol/L Final  . Chloride 09/14/2015 94* 101 - 111 mmol/L Final  . CO2 09/14/2015 29  22 - 32 mmol/L Final  . Glucose, Bld 09/14/2015 96  65 - 99 mg/dL Final  . BUN 09/14/2015 27* 6 - 20 mg/dL Final  . Creatinine, Ser 09/14/2015 1.14* 0.44 - 1.00 mg/dL Final  . Calcium 09/14/2015 8.2* 8.9 - 10.3 mg/dL Final  . Total Protein 09/14/2015 7.4  6.5 - 8.1 g/dL Final  . Albumin 09/14/2015 3.7  3.5 - 5.0 g/dL Final  . AST 09/14/2015 30  15 - 41 U/L Final  . ALT 09/14/2015 22  14 - 54 U/L Final  . Alkaline Phosphatase 09/14/2015 66  38 - 126 U/L Final  . Total Bilirubin 09/14/2015 0.5  0.3 - 1.2 mg/dL Final  . GFR calc non Af Amer 09/14/2015 45* >60 mL/min Final  . GFR calc Af Amer 09/14/2015 52* >60 mL/min Final   Comment: (NOTE) The eGFR has been calculated using the CKD EPI equation. This calculation has not been validated in all clinical situations. eGFR's persistently <60 mL/min signify possible Chronic Kidney Disease.   . Anion gap 09/14/2015 10  5 - 15 Final  . CA 125 09/14/2015 13.1  0.0 - 38.1 U/mL Final   Comment: (NOTE) Roche ECLIA methodology Performed At: [facility] [address] [place], [place] [id number] Darrel Hoover  F MD [id number]   . WBC 09/14/2015 8.2  3.6 - 11.0 K/uL Final   A-LINE DRAW  . RBC 09/14/2015 4.04  3.80 - 5.20 MIL/uL Final  . Hemoglobin 09/14/2015 11.9* 12.0 - 16.0 g/dL Final  . HCT 09/14/2015 35.3  35.0 - 47.0 % Final  . MCV 09/14/2015 87.3  80.0 - 100.0 fL Final  . MCH 09/14/2015 29.6  26.0 - 34.0 pg Final  . MCHC 09/14/2015 33.8  32.0 - 36.0 g/dL Final  . RDW 09/14/2015 14.3  11.5 - 14.5 % Final  . Platelets 09/14/2015 240  150 - 440 K/uL Final  . Neutrophils  Relative % 09/14/2015 66   Final  . Neutro Abs 09/14/2015 5.4  1.4 - 6.5 K/uL Final  . Lymphocytes Relative 09/14/2015 23   Final  . Lymphs Abs 09/14/2015 1.9  1.0 - 3.6 K/uL Final  . Monocytes Relative 09/14/2015 7   Final  . Monocytes Absolute 09/14/2015 0.6  0.2 - 0.9 K/uL Final  . Eosinophils Relative 09/14/2015 3   Final  . Eosinophils Absolute 09/14/2015 0.3  0 - 0.7 K/uL Final  . Basophils Relative 09/14/2015 1   Final  . Basophils Absolute 09/14/2015 0.1  0 - 0.1 K/uL Final      ASSESSMENT: 1.Patient has a history of ovarian cancer review of CT scan of abdomen and lung independently shows progressive disease. I reviewed CT scan of chest and abdomen with patient.  Family was present.  We discussed possibility of chemotherapy Available clinical trials Possibility of immunotherapy patient is against chemotherapy At present time patient wants to wait and decides not to have any cancer directed treatment We will continue to follow the patient and she is in agreement with it If there is a further distention of abdomen or more shortness of breath she would contact us Otherwise we will see her back in 4-6 weeks for evaluation Duration of visit is 25 minutes and 50% of time was spent discussing VARIOUS  options or care with other physicians and social worker   .    No matching staging information was found for the patient.  Forest Gleason, MD   09/25/2015 2:47 PM

## 2015-10-24 ENCOUNTER — Other Ambulatory Visit: Payer: Medicare Other

## 2015-10-24 ENCOUNTER — Ambulatory Visit: Payer: Medicare Other | Admitting: Oncology

## 2015-10-26 ENCOUNTER — Inpatient Hospital Stay: Payer: Medicare Other | Attending: Oncology

## 2015-10-26 ENCOUNTER — Inpatient Hospital Stay: Payer: Medicare Other

## 2015-10-26 ENCOUNTER — Inpatient Hospital Stay (HOSPITAL_BASED_OUTPATIENT_CLINIC_OR_DEPARTMENT_OTHER): Payer: Medicare Other | Admitting: Oncology

## 2015-10-26 ENCOUNTER — Encounter: Payer: Self-pay | Admitting: Oncology

## 2015-10-26 VITALS — BP 147/74 | HR 75 | Temp 97.1°F | Wt 219.5 lb

## 2015-10-26 DIAGNOSIS — E669 Obesity, unspecified: Secondary | ICD-10-CM | POA: Insufficient documentation

## 2015-10-26 DIAGNOSIS — C569 Malignant neoplasm of unspecified ovary: Secondary | ICD-10-CM

## 2015-10-26 DIAGNOSIS — G4730 Sleep apnea, unspecified: Secondary | ICD-10-CM | POA: Diagnosis not present

## 2015-10-26 DIAGNOSIS — Z7982 Long term (current) use of aspirin: Secondary | ICD-10-CM | POA: Insufficient documentation

## 2015-10-26 DIAGNOSIS — I272 Other secondary pulmonary hypertension: Secondary | ICD-10-CM

## 2015-10-26 DIAGNOSIS — Z8543 Personal history of malignant neoplasm of ovary: Secondary | ICD-10-CM | POA: Insufficient documentation

## 2015-10-26 DIAGNOSIS — Z79899 Other long term (current) drug therapy: Secondary | ICD-10-CM | POA: Insufficient documentation

## 2015-10-26 DIAGNOSIS — I10 Essential (primary) hypertension: Secondary | ICD-10-CM | POA: Insufficient documentation

## 2015-10-26 DIAGNOSIS — C7800 Secondary malignant neoplasm of unspecified lung: Secondary | ICD-10-CM | POA: Insufficient documentation

## 2015-10-26 DIAGNOSIS — M25512 Pain in left shoulder: Secondary | ICD-10-CM | POA: Diagnosis not present

## 2015-10-26 DIAGNOSIS — Z9221 Personal history of antineoplastic chemotherapy: Secondary | ICD-10-CM | POA: Insufficient documentation

## 2015-10-26 DIAGNOSIS — R590 Localized enlarged lymph nodes: Secondary | ICD-10-CM

## 2015-10-26 DIAGNOSIS — R1900 Intra-abdominal and pelvic swelling, mass and lump, unspecified site: Secondary | ICD-10-CM | POA: Diagnosis not present

## 2015-10-26 DIAGNOSIS — J45909 Unspecified asthma, uncomplicated: Secondary | ICD-10-CM | POA: Diagnosis not present

## 2015-10-26 DIAGNOSIS — M1990 Unspecified osteoarthritis, unspecified site: Secondary | ICD-10-CM

## 2015-10-26 LAB — COMPREHENSIVE METABOLIC PANEL
ALBUMIN: 3.6 g/dL (ref 3.5–5.0)
ALT: 18 U/L (ref 14–54)
ANION GAP: 11 (ref 5–15)
AST: 27 U/L (ref 15–41)
Alkaline Phosphatase: 63 U/L (ref 38–126)
BILIRUBIN TOTAL: 0.2 mg/dL — AB (ref 0.3–1.2)
BUN: 28 mg/dL — ABNORMAL HIGH (ref 6–20)
CO2: 30 mmol/L (ref 22–32)
Calcium: 8.7 mg/dL — ABNORMAL LOW (ref 8.9–10.3)
Chloride: 94 mmol/L — ABNORMAL LOW (ref 101–111)
Creatinine, Ser: 0.99 mg/dL (ref 0.44–1.00)
GFR, EST NON AFRICAN AMERICAN: 53 mL/min — AB (ref >60)
Glucose, Bld: 98 mg/dL (ref 65–99)
POTASSIUM: 3.8 mmol/L (ref 3.5–5.1)
Sodium: 135 mmol/L (ref 135–145)
TOTAL PROTEIN: 7.2 g/dL (ref 6.5–8.1)

## 2015-10-26 LAB — CBC WITH DIFFERENTIAL/PLATELET
BASOS PCT: 1 %
Basophils Absolute: 0 10*3/uL (ref 0–0.1)
Eosinophils Absolute: 0.3 10*3/uL (ref 0–0.7)
Eosinophils Relative: 4 %
HEMATOCRIT: 33.9 % — AB (ref 35.0–47.0)
Hemoglobin: 11.2 g/dL — ABNORMAL LOW (ref 12.0–16.0)
Lymphocytes Relative: 22 %
Lymphs Abs: 1.8 10*3/uL (ref 1.0–3.6)
MCH: 29.1 pg (ref 26.0–34.0)
MCHC: 33.1 g/dL (ref 32.0–36.0)
MCV: 87.9 fL (ref 80.0–100.0)
MONO ABS: 0.5 10*3/uL (ref 0.2–0.9)
MONOS PCT: 7 %
NEUTROS ABS: 5.5 10*3/uL (ref 1.4–6.5)
Neutrophils Relative %: 66 %
Platelets: 205 10*3/uL (ref 150–440)
RBC: 3.85 MIL/uL (ref 3.80–5.20)
RDW: 14.7 % — AB (ref 11.5–14.5)
WBC: 8.2 10*3/uL (ref 3.6–11.0)

## 2015-10-26 MED ORDER — HEPARIN SOD (PORK) LOCK FLUSH 100 UNIT/ML IV SOLN
500.0000 [IU] | Freq: Once | INTRAVENOUS | Status: AC
  Administered 2015-10-26: 500 [IU] via INTRAVENOUS
  Filled 2015-10-26: qty 5

## 2015-10-26 MED ORDER — SODIUM CHLORIDE 0.9 % IJ SOLN
10.0000 mL | Freq: Once | INTRAMUSCULAR | Status: AC
  Administered 2015-10-26: 10 mL via INTRAVENOUS
  Filled 2015-10-26: qty 10

## 2015-10-26 NOTE — Progress Notes (Signed)
[place] @ [facility] [phone]  Fax:([phone]) [place]: [birth year]  MR#: [id number]  SAY#:[id number]  Patient Care Team: Rusty Aus, MD as PCP - General (Internal Medicine)  CHIEF COMPLAINT:  Chief Complaint  Patient presents with  . Ovarian Cancer    Oncology History   02/2009  Adenocarcinoma ovary with SB involvement             adequate TRS             Carboplatin and palitaxel x 6, moderately well tolerated, last treatment in August, clinically NED 09/2012 nodal recurrence,  resection,              carboplatin and paclitaxel (allergy) and gemcitabine (myelosuppression) x 5 completed NED 03/2013 CT scan and PET scan is consistent with retroperitoneal lymph node suspicious for metastatic disease     Ovarian cancer ([place])    H/O ovarian cancer   11/25/2014 Initial Diagnosis H/O ovarian cancer    Oncology Flowsheet 05/11/2014 05/11/2014 05/12/2014 03/30/2015 03/31/2015 04/04/2015 05/30/2015  dexamethasone (DECADRON) IV - - - - - - -  dexamethasone (DECADRON) PO - - - - - - -  diazepam (VALIUM) IV - - - - 1 mg - -  diazepam (VALIUM) PO 5 mg 5 mg 5 mg - - - -  ondansetron (ZOFRAN) IJ - - - - - - -  ondansetron (ZOFRAN) IM - - - - - - -  ondansetron (ZOFRAN) IV - - - 4 mg 4 mg 4 mg 4 mg    INTERVAL HISTORY:  81 year old lady with recurrent and progressive ovarian cancer came today further follow-up. According to her abdominal pain is improved.  Abdominal distention is slightly better. Patient complains of some's left shoulder pain Patient's ambulation has been limited because of her comorbid condition and obesity.  And arthritis.      REVIEW OF SYSTEMS:   Gen. status: Patient is feeling somewhat stronger than before Lungs: No cough or shortness of breath.Cardiac: No chest pain.  GU: No dysuria hematuria urine analysis was clear culture is pending lower extremity swelling.. Neurological system: No headache no dizziness. Skin: No  rash  Dominant oh pain has improved.  Distention is better. Complains of left shoulder pain is described about.  Patient is more comfortable walking short distances As per HPI. Otherwise, a complete review of systems is negatve.  PAST MEDICAL HISTORY: Past Medical History  Diagnosis Date  . Hypertension   . Anemia   . Sleep apnea     CPAP in use q night   . Pulmonary hypertension ([place])     followed by Dr. Raul Del, Jefm Bryant, PFT's done at that last visit- mid April- 2015  . Shortness of breath   . Depression   . Nervous indigestion     uses one TUM, if this occurs (maybe once per month)    . Arthritis     lumbar stenosis  . Neuromuscular disorder (HCC)     neuropathy hands, feet, legs   . Cancer ([place]) 2010    ovarian, reoccurence - 2013, treatment completed through porta cath. 2014   . Asthma     PAST SURGICAL HISTORY: Past Surgical History  Procedure Laterality Date  . Total hip arthroplasty  2013  . Knee surgery Left 2006  . Eus  08/20/2012    Procedure: UPPER ENDOSCOPIC ULTRASOUND (EUS) LINEAR;  Surgeon: Milus Banister, MD;  Location: [facility] ENDOSCOPY;  Service: Endoscopy;  Laterality: N/A;  radial/ linear   . Joint replacement Right     both knees have been replaced    . Tonsillectomy    . Abdominal hysterectomy    . Back surgery  2003    prior to 2003 episode, had back surgery (lumbar)- 1989  . Laparotomy  2013    for mass in abdomen   . Blepharoplasty    . Colonoscopy with propofol N/A 06/09/2015    Procedure: COLONOSCOPY WITH PROPOFOL;  Surgeon: Hulen Luster, MD;  Location: [facility] ENDOSCOPY;  Service: Gastroenterology;  Laterality: N/A;    FAMILY HISTORY Family History  Problem Relation Age of Onset  . Breast cancer Sister 58  . Breast cancer Maternal Aunt     ADVANCED DIRECTIVES:  Patient does have advance healthcare directive, Patient   does not desire to make any changes HEALTH MAINTENANCE: Social History  Substance Use Topics  . Smoking status: Never  Smoker   . Smokeless tobacco: None  . Alcohol Use: No      Allergies  Allergen Reactions  . Amlodipine Swelling    "leg swelling"  . Metoclopramide Other (See Comments)    "interferes with my pramipexole"  . Metronidazole Diarrhea    "upset stomach"  . Tramadol Hcl Er Other (See Comments)    "hold fluid"    Current Outpatient Prescriptions  Medication Sig Dispense Refill  . albuterol (PROVENTIL HFA;VENTOLIN HFA) 108 (90 BASE) MCG/ACT inhaler Inhale 2 puffs into the lungs every 6 (six) hours as needed for wheezing or shortness of breath.    . Alpha-Lipoic Acid (LIPOIC ACID PO) Take 300 mg by mouth every evening.     Marland Kitchen aspirin EC 81 MG tablet Take 81 mg by mouth daily.    . budesonide-formoterol (SYMBICORT) 160-4.5 MCG/ACT inhaler Inhale 2 puffs into the lungs 2 (two) times daily.    . calcium carbonate (TUMS - DOSED IN MG ELEMENTAL CALCIUM) 500 MG chewable tablet Chew 1 tablet by mouth daily.    . Cholecalciferol (VITAMIN D3) 2000 UNITS TABS Take 2,000 Units by mouth daily with breakfast.     . Coenzyme Q10-Fish Oil-Vit E (CO-Q 10 OMEGA-3 FISH OIL) CAPS Take 1 capsule by mouth 2 (two) times daily.     . Iron-Vitamin C (VITRON-C PO) Take 1 tablet by mouth daily with breakfast.     . Magnesium Oxide 250 MG TABS Take 1 tablet by mouth daily.    . ondansetron (ZOFRAN) 4 MG tablet Take 4 mg by mouth every 6 (six) hours as needed for nausea.    Marland Kitchen OVER THE COUNTER MEDICATION Take 2 capsules by mouth daily. GSH-3P    . oxyCODONE (ROXICODONE) 5 MG immediate release tablet Take 1 tablet (5 mg total) by mouth every 6 (six) hours as needed for moderate pain or severe pain. 4 tablet 0  . PARoxetine (PAXIL) 20 MG tablet Take 20 mg by mouth daily.    . pramipexole (MIRAPEX) 0.25 MG tablet Take 0.25 mg by mouth at bedtime as needed and may repeat dose one time if needed (restless leg).     . simvastatin (ZOCOR) 20 MG tablet Take 20 mg by mouth every evening.    . torsemide (DEMADEX) 20 MG tablet  Take 10 mg by mouth daily with breakfast.     . triamterene-hydrochlorothiazide (MAXZIDE-25) 37.5-25 MG per tablet Take 2 tablets by mouth daily.    . vitamin B-12 (CYANOCOBALAMIN) 1000 MCG tablet Take 1,000 mcg by mouth daily with breakfast.      No  current facility-administered medications for this visit.   Facility-Administered Medications Ordered in Other Visits  Medication Dose Route Frequency Provider Last Rate Last Dose  . sodium chloride 0.9 % injection 10 mL  10 mL Intravenous PRN Forest Gleason, MD   10 mL at 05/31/15 0912    OBJECTIVE:  Filed Vitals:   10/26/15 1149  BP: 147/74  Pulse: 75  Temp: 97.1 F (36.2 C)     Body mass index is 42.87 kg/(m^2).    ECOG FS:1 - Symptomatic but completely ambulatory  PHYSICAL EXAM: General  status: Performance status is good.  Patient has not lost significant weight moderately obese lady  HEENT: No evidence of stomatitis. Sclera and conjunctivae :: No jaundice.   pale looking. Lungs: Air  entry equal on both sides.  No rhonchi.  No rales.  Cardiac: Heart sounds are normal.  No pericardial rub.  No murmur. Lymphatic system: Cervical, axillary, inguinal, lymph nodes not palpable GI: Abdomen is soft.  No ascites.  Liver spleen not palpable.  No tenderness.  Bowel sounds are within normal limitThere is no tenderness in the left lower quadrant.  Bowel sounds are present.  No palpable mass.  Abdominal distention Lower extremity: No edema Neurological system: Higher functions, cranial nerves intact no evidence of peripheral neuropathy. Skin: No rash.  No ecchymosis.Marland Kitchen   LAB RESULTS:  Infusion on 10/26/2015  Component Date Value Ref Range Status  . WBC 10/26/2015 8.2  3.6 - 11.0 K/uL Final   A-LINE DRAW  . RBC 10/26/2015 3.85  3.80 - 5.20 MIL/uL Final  . Hemoglobin 10/26/2015 11.2* 12.0 - 16.0 g/dL Final  . HCT 10/26/2015 33.9* 35.0 - 47.0 % Final  . MCV 10/26/2015 87.9  80.0 - 100.0 fL Final  . MCH 10/26/2015 29.1  26.0 - 34.0 pg Final   . MCHC 10/26/2015 33.1  32.0 - 36.0 g/dL Final  . RDW 10/26/2015 14.7* 11.5 - 14.5 % Final  . Platelets 10/26/2015 205  150 - 440 K/uL Final  . Neutrophils Relative % 10/26/2015 66   Final  . Neutro Abs 10/26/2015 5.5  1.4 - 6.5 K/uL Final  . Lymphocytes Relative 10/26/2015 22   Final  . Lymphs Abs 10/26/2015 1.8  1.0 - 3.6 K/uL Final  . Monocytes Relative 10/26/2015 7   Final  . Monocytes Absolute 10/26/2015 0.5  0.2 - 0.9 K/uL Final  . Eosinophils Relative 10/26/2015 4   Final  . Eosinophils Absolute 10/26/2015 0.3  0 - 0.7 K/uL Final  . Basophils Relative 10/26/2015 1   Final  . Basophils Absolute 10/26/2015 0.0  0 - 0.1 K/uL Final  . Sodium 10/26/2015 135  135 - 145 mmol/L Final  . Potassium 10/26/2015 3.8  3.5 - 5.1 mmol/L Final  . Chloride 10/26/2015 94* 101 - 111 mmol/L Final  . CO2 10/26/2015 30  22 - 32 mmol/L Final  . Glucose, Bld 10/26/2015 98  65 - 99 mg/dL Final  . BUN 10/26/2015 28* 6 - 20 mg/dL Final  . Creatinine, Ser 10/26/2015 0.99  0.44 - 1.00 mg/dL Final  . Calcium 10/26/2015 8.7* 8.9 - 10.3 mg/dL Final  . Total Protein 10/26/2015 7.2  6.5 - 8.1 g/dL Final  . Albumin 10/26/2015 3.6  3.5 - 5.0 g/dL Final  . AST 10/26/2015 27  15 - 41 U/L Final  . ALT 10/26/2015 18  14 - 54 U/L Final  . Alkaline Phosphatase 10/26/2015 63  38 - 126 U/L Final  . Total Bilirubin 10/26/2015 0.2* 0.3 -  1.2 mg/dL Final  . GFR calc non Af Amer 10/26/2015 53* >60 mL/min Final  . GFR calc Af Amer 10/26/2015 >60  >60 mL/min Final   Comment: (NOTE) The eGFR has been calculated using the CKD EPI equation. This calculation has not been validated in all clinical situations. eGFR's persistently <60 mL/min signify possible Chronic Kidney Disease.   . Anion gap 10/26/2015 11  5 - 15 Final      ASSESSMENT: 1.Patient has a history of ovarian cancer review of CT scan of abdomen and lung independently shows progressive disease. Somehow the patient's general condition is improved.  Abdominal  pain is improved.  Patient is absolutely against any cancer directed therapy. We will continue to watch.  Patient wants to start treatment only. Becomes more symptomatic or there is a progressive increase in retroperitoneal lymphadenopathy Reevaluation in 3-4 months or before if patient developed sent is more symptoms .    No matching staging information was found for the patient.  Forest Gleason, MD   10/26/2015 11:54 AM

## 2015-10-26 NOTE — Progress Notes (Signed)
Patient complains of tingling in left shoulder much like when it has been asleeep and is waking up.

## 2015-12-04 ENCOUNTER — Telehealth: Payer: Self-pay | Admitting: *Deleted

## 2015-12-04 NOTE — Telephone Encounter (Signed)
Having worsening abd pain, asking to see Dr Oliva Bustard. Appt agreed on by pt for tomorrow at 1115 am

## 2015-12-05 ENCOUNTER — Inpatient Hospital Stay: Payer: Medicare Other

## 2015-12-05 ENCOUNTER — Encounter: Payer: Self-pay | Admitting: Oncology

## 2015-12-05 ENCOUNTER — Inpatient Hospital Stay: Payer: Medicare Other | Attending: Oncology | Admitting: Oncology

## 2015-12-05 VITALS — BP 151/75 | HR 73 | Temp 97.0°F | Resp 18 | Wt 225.0 lb

## 2015-12-05 DIAGNOSIS — G4730 Sleep apnea, unspecified: Secondary | ICD-10-CM | POA: Diagnosis not present

## 2015-12-05 DIAGNOSIS — R309 Painful micturition, unspecified: Secondary | ICD-10-CM

## 2015-12-05 DIAGNOSIS — R1900 Intra-abdominal and pelvic swelling, mass and lump, unspecified site: Secondary | ICD-10-CM | POA: Diagnosis not present

## 2015-12-05 DIAGNOSIS — C7800 Secondary malignant neoplasm of unspecified lung: Secondary | ICD-10-CM | POA: Insufficient documentation

## 2015-12-05 DIAGNOSIS — M25512 Pain in left shoulder: Secondary | ICD-10-CM | POA: Insufficient documentation

## 2015-12-05 DIAGNOSIS — Z7982 Long term (current) use of aspirin: Secondary | ICD-10-CM | POA: Diagnosis not present

## 2015-12-05 DIAGNOSIS — Z803 Family history of malignant neoplasm of breast: Secondary | ICD-10-CM | POA: Insufficient documentation

## 2015-12-05 DIAGNOSIS — I272 Other secondary pulmonary hypertension: Secondary | ICD-10-CM | POA: Diagnosis not present

## 2015-12-05 DIAGNOSIS — I10 Essential (primary) hypertension: Secondary | ICD-10-CM

## 2015-12-05 DIAGNOSIS — Z8543 Personal history of malignant neoplasm of ovary: Secondary | ICD-10-CM | POA: Insufficient documentation

## 2015-12-05 DIAGNOSIS — R590 Localized enlarged lymph nodes: Secondary | ICD-10-CM | POA: Diagnosis not present

## 2015-12-05 DIAGNOSIS — J45909 Unspecified asthma, uncomplicated: Secondary | ICD-10-CM | POA: Insufficient documentation

## 2015-12-05 DIAGNOSIS — Z9221 Personal history of antineoplastic chemotherapy: Secondary | ICD-10-CM | POA: Insufficient documentation

## 2015-12-05 DIAGNOSIS — M1990 Unspecified osteoarthritis, unspecified site: Secondary | ICD-10-CM | POA: Diagnosis not present

## 2015-12-05 DIAGNOSIS — C801 Malignant (primary) neoplasm, unspecified: Secondary | ICD-10-CM

## 2015-12-05 DIAGNOSIS — C569 Malignant neoplasm of unspecified ovary: Secondary | ICD-10-CM | POA: Diagnosis present

## 2015-12-05 DIAGNOSIS — R1031 Right lower quadrant pain: Secondary | ICD-10-CM

## 2015-12-05 DIAGNOSIS — Z79899 Other long term (current) drug therapy: Secondary | ICD-10-CM | POA: Diagnosis not present

## 2015-12-05 LAB — COMPREHENSIVE METABOLIC PANEL
ALBUMIN: 3.3 g/dL — AB (ref 3.5–5.0)
ALK PHOS: 70 U/L (ref 38–126)
ALT: 20 U/L (ref 14–54)
ANION GAP: 10 (ref 5–15)
AST: 26 U/L (ref 15–41)
BUN: 33 mg/dL — ABNORMAL HIGH (ref 6–20)
CALCIUM: 8.9 mg/dL (ref 8.9–10.3)
CHLORIDE: 94 mmol/L — AB (ref 101–111)
CO2: 30 mmol/L (ref 22–32)
Creatinine, Ser: 1.07 mg/dL — ABNORMAL HIGH (ref 0.44–1.00)
GFR calc Af Amer: 56 mL/min — ABNORMAL LOW (ref >60)
GFR calc non Af Amer: 48 mL/min — ABNORMAL LOW (ref >60)
GLUCOSE: 107 mg/dL — AB (ref 65–99)
Potassium: 3.4 mmol/L — ABNORMAL LOW (ref 3.5–5.1)
SODIUM: 134 mmol/L — AB (ref 135–145)
Total Bilirubin: 0.4 mg/dL (ref 0.3–1.2)
Total Protein: 7.6 g/dL (ref 6.5–8.1)

## 2015-12-05 LAB — URINALYSIS COMPLETE WITH MICROSCOPIC (ARMC ONLY)
BACTERIA UA: NONE SEEN
Bilirubin Urine: NEGATIVE
Glucose, UA: NEGATIVE mg/dL
HGB URINE DIPSTICK: NEGATIVE
Ketones, ur: NEGATIVE mg/dL
Nitrite: NEGATIVE
PH: 6 (ref 5.0–8.0)
PROTEIN: NEGATIVE mg/dL
Specific Gravity, Urine: 1.008 (ref 1.005–1.030)

## 2015-12-05 LAB — CBC WITH DIFFERENTIAL/PLATELET
BASOS PCT: 0 %
Basophils Absolute: 0 10*3/uL (ref 0–0.1)
EOS ABS: 0.4 10*3/uL (ref 0–0.7)
Eosinophils Relative: 5 %
HCT: 32.5 % — ABNORMAL LOW (ref 35.0–47.0)
HEMOGLOBIN: 10.6 g/dL — AB (ref 12.0–16.0)
Lymphocytes Relative: 23 %
Lymphs Abs: 1.9 10*3/uL (ref 1.0–3.6)
MCH: 28 pg (ref 26.0–34.0)
MCHC: 32.8 g/dL (ref 32.0–36.0)
MCV: 85.5 fL (ref 80.0–100.0)
MONOS PCT: 8 %
Monocytes Absolute: 0.6 10*3/uL (ref 0.2–0.9)
NEUTROS PCT: 64 %
Neutro Abs: 5.3 10*3/uL (ref 1.4–6.5)
PLATELETS: 234 10*3/uL (ref 150–440)
RBC: 3.8 MIL/uL (ref 3.80–5.20)
RDW: 15 % — AB (ref 11.5–14.5)
WBC: 8.2 10*3/uL (ref 3.6–11.0)

## 2015-12-05 MED ORDER — SODIUM CHLORIDE 0.9% FLUSH
10.0000 mL | INTRAVENOUS | Status: DC | PRN
  Administered 2015-12-05: 10 mL via INTRAVENOUS
  Filled 2015-12-05: qty 10

## 2015-12-05 MED ORDER — HEPARIN SOD (PORK) LOCK FLUSH 100 UNIT/ML IV SOLN
500.0000 [IU] | Freq: Once | INTRAVENOUS | Status: AC
  Administered 2015-12-05: 500 [IU] via INTRAVENOUS

## 2015-12-05 MED ORDER — HEPARIN SOD (PORK) LOCK FLUSH 100 UNIT/ML IV SOLN
INTRAVENOUS | Status: AC
  Filled 2015-12-05: qty 5

## 2015-12-05 MED ORDER — OXYCODONE HCL ER 10 MG PO T12A: 10.0000 mg | EXTENDED_RELEASE_TABLET | Freq: Every day | ORAL | Status: AC

## 2015-12-05 NOTE — Progress Notes (Signed)
Patient states she has been having abdominal pain at night. It was more on her left side but now has moved to right side.  States it keeps her awake at night.

## 2015-12-05 NOTE — Progress Notes (Signed)
[place] @ [facility] [phone]  Fax:([phone]) [place]: May 10, 1934  MR#: [id number]  DJS#:[id number]  Patient Care Team: Rusty Aus, MD as PCP - General (Internal Medicine)  CHIEF COMPLAINT:  Chief Complaint  Patient presents with  . Ovarian Cancer    Oncology History   02/2009  Adenocarcinoma ovary with SB involvement             adequate TRS             Carboplatin and palitaxel x 6, moderately well tolerated, last treatment in August, clinically NED 09/2012 nodal recurrence,  resection,              carboplatin and paclitaxel (allergy) and gemcitabine (myelosuppression) x 5 completed NED 03/2013 CT scan and PET scan is consistent with retroperitoneal lymph node suspicious for metastatic disease     Ovarian cancer ([place])    H/O ovarian cancer   11/25/2014 Initial Diagnosis H/O ovarian cancer      INTERVAL HISTORY:  82 year old lady with recurrent and progressive ovarian cancer came today further follow-up. According to her abdominal pain is improved.  Abdominal distention is slightly better. Patient complains of some's left shoulder pain Patient's ambulation has been limited because of her comorbid condition and obesity.  And arthritis.  December 05, 2015 Patient came is an acute add-on complaining of pain in the right lower quadrant.  Started a week ago.  Pain is more during the night radiating in the back.  No dysuria hematuria.  Patient had similar kind of pain in the left lower quadrant which is resolved.  Patient does have recurrent ovarian cancer with retroperitoneal lymph node enlargement. No chills.  No fever.  No nausea.  No vomiting.  No diarrhea.    REVIEW OF SYSTEMS:   Gen. status: Patient is feeling somewhat stronger than before Lungs: No cough or shortness of breath.Cardiac: No chest pain.  GU: No dysuria hematuria  Neurological system: No headache no dizziness. Skin: No rash Right lower quadrant pain persistent  particularly during the night. Not associated with hematuria.  No chills.  No fever.  No nausea.  No constipation. Left lower quadrant pain is resolved.. Complains of left shoulder pain is described about. All other 12 systems have been reviewed and are negative Patient is more comfortable walking short distances As per HPI. Otherwise, a complete review of systems is negatve.  PAST MEDICAL HISTORY: Past Medical History  Diagnosis Date  . Hypertension   . Anemia   . Sleep apnea     CPAP in use q night   . Pulmonary hypertension ([place])     followed by Dr. Raul Del, Jefm Bryant, PFT's done at that last visit- mid April- 2015  . Shortness of breath   . Depression   . Nervous indigestion     uses one TUM, if this occurs (maybe once per month)    . Arthritis     lumbar stenosis  . Neuromuscular disorder (HCC)     neuropathy hands, feet, legs   . Cancer ([place]) 2010    ovarian, reoccurence - 2013, treatment completed through porta cath. 2014   . Asthma     PAST SURGICAL HISTORY: Past Surgical History  Procedure Laterality Date  . Total hip arthroplasty  2013  . Knee surgery Left 2006  . Eus  08/20/2012    Procedure: UPPER ENDOSCOPIC ULTRASOUND (EUS) LINEAR;  Surgeon: Milus Banister, MD;  Location: [facility] ENDOSCOPY;  Service: Endoscopy;  Laterality: N/A;  radial/ linear   . Joint replacement Right     both knees have been replaced    . Tonsillectomy    . Abdominal hysterectomy    . Back surgery  2003    prior to 2003 episode, had back surgery (lumbar)- 1989  . Laparotomy  2013    for mass in abdomen   . Blepharoplasty    . Colonoscopy with propofol N/A 06/09/2015    Procedure: COLONOSCOPY WITH PROPOFOL;  Surgeon: Hulen Luster, MD;  Location: [facility] ENDOSCOPY;  Service: Gastroenterology;  Laterality: N/A;    FAMILY HISTORY Family History  Problem Relation Age of Onset  . Breast cancer Sister 3  . Breast cancer Maternal Aunt     ADVANCED DIRECTIVES:  Patient does have advance  healthcare directive, Patient   does not desire to make any changes HEALTH MAINTENANCE: Social History  Substance Use Topics  . Smoking status: Never Smoker   . Smokeless tobacco: None  . Alcohol Use: No      Allergies  Allergen Reactions  . Amlodipine Swelling    "leg swelling"  . Metoclopramide Other (See Comments)    "interferes with my pramipexole"  . Metronidazole Diarrhea    "upset stomach"  . Tramadol Hcl Er Other (See Comments)    "hold fluid"    Current Outpatient Prescriptions  Medication Sig Dispense Refill  . albuterol (PROVENTIL HFA;VENTOLIN HFA) 108 (90 BASE) MCG/ACT inhaler Inhale 2 puffs into the lungs every 6 (six) hours as needed for wheezing or shortness of breath.    . Alpha-Lipoic Acid (LIPOIC ACID PO) Take 300 mg by mouth every evening.     Marland Kitchen aspirin EC 81 MG tablet Take 81 mg by mouth daily.    . budesonide-formoterol (SYMBICORT) 160-4.5 MCG/ACT inhaler Inhale 2 puffs into the lungs 2 (two) times daily.    . calcium carbonate (TUMS - DOSED IN MG ELEMENTAL CALCIUM) 500 MG chewable tablet Chew 1 tablet by mouth daily.    . Cholecalciferol (VITAMIN D3) 2000 UNITS TABS Take 2,000 Units by mouth daily with breakfast.     . Coenzyme Q10-Fish Oil-Vit E (CO-Q 10 OMEGA-3 FISH OIL) CAPS Take 1 capsule by mouth 2 (two) times daily.     . Iron-Vitamin C (VITRON-C PO) Take 1 tablet by mouth daily with breakfast.     . Magnesium Oxide 250 MG TABS Take 1 tablet by mouth daily.    . ondansetron (ZOFRAN) 4 MG tablet Take 4 mg by mouth every 6 (six) hours as needed for nausea.    Marland Kitchen OVER THE COUNTER MEDICATION Take 2 capsules by mouth daily. GSH-3P    . oxyCODONE (ROXICODONE) 5 MG immediate release tablet Take 1 tablet (5 mg total) by mouth every 6 (six) hours as needed for moderate pain or severe pain. 4 tablet 0  . PARoxetine (PAXIL) 20 MG tablet Take 20 mg by mouth daily.    . pramipexole (MIRAPEX) 0.25 MG tablet Take 0.25 mg by mouth at bedtime as needed and may repeat  dose one time if needed (restless leg).     . pregabalin (LYRICA) 100 MG capsule Take by mouth.    . simvastatin (ZOCOR) 20 MG tablet Take 20 mg by mouth every evening.    . torsemide (DEMADEX) 20 MG tablet Take 10 mg by mouth daily with breakfast.     . triamterene-hydrochlorothiazide (MAXZIDE-25) 37.5-25 MG per tablet Take 2 tablets by mouth daily.    . vitamin B-12 (CYANOCOBALAMIN) 1000 MCG  tablet Take 1,000 mcg by mouth daily with breakfast.     . oxyCODONE (OXYCONTIN) 10 mg 12 hr tablet Take 1 tablet (10 mg total) by mouth daily after supper. 15 tablet 0   No current facility-administered medications for this visit.   Facility-Administered Medications Ordered in Other Visits  Medication Dose Route Frequency Provider Last Rate Last Dose  . sodium chloride 0.9 % injection 10 mL  10 mL Intravenous PRN Forest Gleason, MD   10 mL at 05/31/15 0912    OBJECTIVE:  Filed Vitals:   12/05/15 1136  BP: 151/75  Pulse: 73  Temp: 97 F (36.1 C)  Resp: 18     Body mass index is 43.94 kg/(m^2).    ECOG FS:1 - Symptomatic but completely ambulatory  PHYSICAL EXAM: General  status: Performance status is good.  Patient has not lost significant weight moderately obese lady  HEENT: No evidence of stomatitis. Sclera and conjunctivae :: No jaundice.   pale looking. Lungs: Air  entry equal on both sides.  No rhonchi.  No rales.  Cardiac: Heart sounds are normal.  No pericardial rub.  No murmur. Lymphatic system: Cervical, axillary, inguinal, lymph nodes not palpable GI: Abdomen is soft.  No ascites.  Liver spleen not palpable.  No tenderness.  Bowel sounds are within normal there is tenderness in the right lower quadrant.  No guarding.  No palpable mass  Bowel sounds are present.  No palpable mass.  Abdominal distention Lower extremity: No edema Neurological system: Higher functions, cranial nerves intact no evidence of peripheral neuropathy. Skin: No rash.  No ecchymosis.Marland Kitchen   LAB RESULTS:  No visits  with results within 2 Day(s) from this visit. Latest known visit with results is:  Infusion on 10/26/2015  Component Date Value Ref Range Status  . WBC 10/26/2015 8.2  3.6 - 11.0 K/uL Final   A-LINE DRAW  . RBC 10/26/2015 3.85  3.80 - 5.20 MIL/uL Final  . Hemoglobin 10/26/2015 11.2* 12.0 - 16.0 g/dL Final  . HCT 10/26/2015 33.9* 35.0 - 47.0 % Final  . MCV 10/26/2015 87.9  80.0 - 100.0 fL Final  . MCH 10/26/2015 29.1  26.0 - 34.0 pg Final  . MCHC 10/26/2015 33.1  32.0 - 36.0 g/dL Final  . RDW 10/26/2015 14.7* 11.5 - 14.5 % Final  . Platelets 10/26/2015 205  150 - 440 K/uL Final  . Neutrophils Relative % 10/26/2015 66   Final  . Neutro Abs 10/26/2015 5.5  1.4 - 6.5 K/uL Final  . Lymphocytes Relative 10/26/2015 22   Final  . Lymphs Abs 10/26/2015 1.8  1.0 - 3.6 K/uL Final  . Monocytes Relative 10/26/2015 7   Final  . Monocytes Absolute 10/26/2015 0.5  0.2 - 0.9 K/uL Final  . Eosinophils Relative 10/26/2015 4   Final  . Eosinophils Absolute 10/26/2015 0.3  0 - 0.7 K/uL Final  . Basophils Relative 10/26/2015 1   Final  . Basophils Absolute 10/26/2015 0.0  0 - 0.1 K/uL Final  . Sodium 10/26/2015 135  135 - 145 mmol/L Final  . Potassium 10/26/2015 3.8  3.5 - 5.1 mmol/L Final  . Chloride 10/26/2015 94* 101 - 111 mmol/L Final  . CO2 10/26/2015 30  22 - 32 mmol/L Final  . Glucose, Bld 10/26/2015 98  65 - 99 mg/dL Final  . BUN 10/26/2015 28* 6 - 20 mg/dL Final  . Creatinine, Ser 10/26/2015 0.99  0.44 - 1.00 mg/dL Final  . Calcium 10/26/2015 8.7* 8.9 - 10.3 mg/dL Final  .  Total Protein 10/26/2015 7.2  6.5 - 8.1 g/dL Final  . Albumin 10/26/2015 3.6  3.5 - 5.0 g/dL Final  . AST 10/26/2015 27  15 - 41 U/L Final  . ALT 10/26/2015 18  14 - 54 U/L Final  . Alkaline Phosphatase 10/26/2015 63  38 - 126 U/L Final  . Total Bilirubin 10/26/2015 0.2* 0.3 - 1.2 mg/dL Final  . GFR calc non Af Amer 10/26/2015 53* >60 mL/min Final  . GFR calc Af Amer 10/26/2015 >60  >60 mL/min Final   Comment:  (NOTE) The eGFR has been calculated using the CKD EPI equation. This calculation has not been validated in all clinical situations. eGFR's persistently <60 mL/min signify possible Chronic Kidney Disease.   . Anion gap 10/26/2015 11  5 - 15 Final      ASSESSMENT: 1.Patient has a history of ovarian cancer review of CT scan of abdomen and lung independently shows progressive disease.   Acute onset of the right lower quadrant pain.  Will check urine analysis urine culture CBC metabolic panel. If there are all within normal limit and pain continues then a CT scan or a PET scan would be ordered. OxyContin 10 mg at nighttime for longer pain control.  As patient takes Vicodin which controls pain only for 2 hours .    No matching staging information was found for the patient.  Forest Gleason, MD   12/05/2015 12:06 PM

## 2015-12-06 ENCOUNTER — Telehealth: Payer: Self-pay | Admitting: *Deleted

## 2015-12-06 MED ORDER — CIPROFLOXACIN HCL 500 MG PO TABS: 500.0000 mg | ORAL_TABLET | Freq: Two times a day (BID) | ORAL | Status: AC

## 2015-12-06 NOTE — Telephone Encounter (Signed)
UA is positive. MD recommends starting pt on cipro 500mg  every 12 hours for 7 days. If pain persists for more than 1 week, pt needs to call our office back to see MD for further evaluation. cipro has been escribed to pharmacy.

## 2015-12-06 NOTE — Telephone Encounter (Signed)
Called patient to let her know UA was positive.  Cipro has been called to her pharmacy.  She is to take it for the next 7 days.  If pain persists after 7 days she is to call back and we will reevaluate.  Patient verbalized understanding.

## 2015-12-07 LAB — URINE CULTURE

## 2015-12-08 ENCOUNTER — Observation Stay: Payer: Medicare Other | Admitting: Anesthesiology

## 2015-12-08 ENCOUNTER — Inpatient Hospital Stay
Admission: EM | Admit: 2015-12-08 | Discharge: 2015-12-09 | DRG: 377 | Disposition: A | Discharge status: Other Acute Inpatient Hospital | Payer: Medicare Other | Attending: Specialist | Admitting: Specialist

## 2015-12-08 ENCOUNTER — Other Ambulatory Visit: Payer: Self-pay

## 2015-12-08 ENCOUNTER — Encounter: Payer: Self-pay | Admitting: Intensive Care

## 2015-12-08 ENCOUNTER — Encounter
Admission: EM | Disposition: A | Discharge status: Other Acute Inpatient Hospital | Payer: Self-pay | Source: Home / Self Care | Attending: Specialist

## 2015-12-08 DIAGNOSIS — Z803 Family history of malignant neoplasm of breast: Secondary | ICD-10-CM

## 2015-12-08 DIAGNOSIS — I10 Essential (primary) hypertension: Secondary | ICD-10-CM | POA: Diagnosis present

## 2015-12-08 DIAGNOSIS — Z96653 Presence of artificial knee joint, bilateral: Secondary | ICD-10-CM | POA: Diagnosis present

## 2015-12-08 DIAGNOSIS — E785 Hyperlipidemia, unspecified: Secondary | ICD-10-CM | POA: Diagnosis present

## 2015-12-08 DIAGNOSIS — N390 Urinary tract infection, site not specified: Secondary | ICD-10-CM | POA: Diagnosis present

## 2015-12-08 DIAGNOSIS — K5900 Constipation, unspecified: Secondary | ICD-10-CM | POA: Diagnosis present

## 2015-12-08 DIAGNOSIS — D123 Benign neoplasm of transverse colon: Secondary | ICD-10-CM | POA: Diagnosis present

## 2015-12-08 DIAGNOSIS — K920 Hematemesis: Secondary | ICD-10-CM | POA: Diagnosis not present

## 2015-12-08 DIAGNOSIS — R571 Hypovolemic shock: Secondary | ICD-10-CM | POA: Diagnosis present

## 2015-12-08 DIAGNOSIS — G8929 Other chronic pain: Secondary | ICD-10-CM | POA: Diagnosis present

## 2015-12-08 DIAGNOSIS — Z888 Allergy status to other drugs, medicaments and biological substances status: Secondary | ICD-10-CM

## 2015-12-08 DIAGNOSIS — Z90710 Acquired absence of both cervix and uterus: Secondary | ICD-10-CM

## 2015-12-08 DIAGNOSIS — Z9889 Other specified postprocedural states: Secondary | ICD-10-CM

## 2015-12-08 DIAGNOSIS — F419 Anxiety disorder, unspecified: Secondary | ICD-10-CM | POA: Diagnosis present

## 2015-12-08 DIAGNOSIS — D62 Acute posthemorrhagic anemia: Secondary | ICD-10-CM | POA: Diagnosis present

## 2015-12-08 DIAGNOSIS — G629 Polyneuropathy, unspecified: Secondary | ICD-10-CM | POA: Diagnosis present

## 2015-12-08 DIAGNOSIS — I272 Other secondary pulmonary hypertension: Secondary | ICD-10-CM | POA: Diagnosis present

## 2015-12-08 DIAGNOSIS — K922 Gastrointestinal hemorrhage, unspecified: Secondary | ICD-10-CM

## 2015-12-08 DIAGNOSIS — Z79899 Other long term (current) drug therapy: Secondary | ICD-10-CM

## 2015-12-08 DIAGNOSIS — Z96649 Presence of unspecified artificial hip joint: Secondary | ICD-10-CM | POA: Diagnosis present

## 2015-12-08 DIAGNOSIS — J45909 Unspecified asthma, uncomplicated: Secondary | ICD-10-CM | POA: Diagnosis present

## 2015-12-08 DIAGNOSIS — C569 Malignant neoplasm of unspecified ovary: Secondary | ICD-10-CM | POA: Diagnosis present

## 2015-12-08 DIAGNOSIS — G4733 Obstructive sleep apnea (adult) (pediatric): Secondary | ICD-10-CM | POA: Diagnosis present

## 2015-12-08 DIAGNOSIS — J449 Chronic obstructive pulmonary disease, unspecified: Secondary | ICD-10-CM | POA: Diagnosis present

## 2015-12-08 DIAGNOSIS — Z66 Do not resuscitate: Secondary | ICD-10-CM | POA: Diagnosis present

## 2015-12-08 DIAGNOSIS — N170 Acute kidney failure with tubular necrosis: Secondary | ICD-10-CM | POA: Diagnosis present

## 2015-12-08 DIAGNOSIS — Z7982 Long term (current) use of aspirin: Secondary | ICD-10-CM

## 2015-12-08 DIAGNOSIS — G2581 Restless legs syndrome: Secondary | ICD-10-CM | POA: Diagnosis present

## 2015-12-08 HISTORY — PX: ESOPHAGOGASTRODUODENOSCOPY (EGD) WITH PROPOFOL: SHX5813

## 2015-12-08 LAB — URINALYSIS COMPLETE WITH MICROSCOPIC (ARMC ONLY)
BILIRUBIN URINE: NEGATIVE
GLUCOSE, UA: NEGATIVE mg/dL
Hgb urine dipstick: NEGATIVE
KETONES UR: NEGATIVE mg/dL
Leukocytes, UA: NEGATIVE
NITRITE: NEGATIVE
Protein, ur: NEGATIVE mg/dL
RBC / HPF: NONE SEEN RBC/hpf (ref 0–5)
Specific Gravity, Urine: 1.009 (ref 1.005–1.030)
pH: 7 (ref 5.0–8.0)

## 2015-12-08 LAB — COMPREHENSIVE METABOLIC PANEL
ALBUMIN: 3.2 g/dL — AB (ref 3.5–5.0)
ALK PHOS: 61 U/L (ref 38–126)
ALT: 18 U/L (ref 14–54)
AST: 27 U/L (ref 15–41)
Anion gap: 12 (ref 5–15)
BUN: 35 mg/dL — ABNORMAL HIGH (ref 6–20)
CHLORIDE: 95 mmol/L — AB (ref 101–111)
CO2: 32 mmol/L (ref 22–32)
CREATININE: 1.26 mg/dL — AB (ref 0.44–1.00)
Calcium: 8.8 mg/dL — ABNORMAL LOW (ref 8.9–10.3)
GFR calc non Af Amer: 39 mL/min — ABNORMAL LOW (ref >60)
GFR, EST AFRICAN AMERICAN: 46 mL/min — AB (ref >60)
GLUCOSE: 147 mg/dL — AB (ref 65–99)
Potassium: 3.6 mmol/L (ref 3.5–5.1)
Sodium: 139 mmol/L (ref 135–145)
Total Bilirubin: 0.5 mg/dL (ref 0.3–1.2)
Total Protein: 7.1 g/dL (ref 6.5–8.1)

## 2015-12-08 LAB — ABO/RH: ABO/RH(D): B POS

## 2015-12-08 LAB — CBC
HCT: 30.5 % — ABNORMAL LOW (ref 35.0–47.0)
HEMOGLOBIN: 9.8 g/dL — AB (ref 12.0–16.0)
MCH: 27.8 pg (ref 26.0–34.0)
MCHC: 32.1 g/dL (ref 32.0–36.0)
MCV: 86.9 fL (ref 80.0–100.0)
PLATELETS: 118 10*3/uL — AB (ref 150–440)
RBC: 3.51 MIL/uL — AB (ref 3.80–5.20)
RDW: 15.1 % — ABNORMAL HIGH (ref 11.5–14.5)
WBC: 13.2 10*3/uL — AB (ref 3.6–11.0)

## 2015-12-08 LAB — LIPASE, BLOOD: Lipase: 229 U/L — ABNORMAL HIGH (ref 11–51)

## 2015-12-08 LAB — HEMOGLOBIN AND HEMATOCRIT, BLOOD
HCT: 28.1 % — ABNORMAL LOW (ref 35.0–47.0)
HCT: 21.7 % — ABNORMAL LOW (ref 35.0–47.0)
HEMOGLOBIN: 9 g/dL — AB (ref 12.0–16.0)
Hemoglobin: 7 g/dL — ABNORMAL LOW (ref 12.0–16.0)

## 2015-12-08 SURGERY — ESOPHAGOGASTRODUODENOSCOPY (EGD) WITH PROPOFOL
Anesthesia: General

## 2015-12-08 MED ORDER — IRON-VITAMIN C 65-125 MG PO TABS: ORAL_TABLET | Freq: Every day | ORAL | Status: DC

## 2015-12-08 MED ORDER — ONDANSETRON HCL 4 MG/2ML IJ SOLN
4.0000 mg | Freq: Once | INTRAMUSCULAR | Status: AC
  Administered 2015-12-08: 4 mg via INTRAVENOUS

## 2015-12-08 MED ORDER — ONDANSETRON HCL 4 MG PO TABS: 4.0000 mg | ORAL_TABLET | Freq: Four times a day (QID) | ORAL | Status: DC | PRN

## 2015-12-08 MED ORDER — PROPOFOL 500 MG/50ML IV EMUL
INTRAVENOUS | Status: DC | PRN
  Administered 2015-12-08: 160 ug/kg/min via INTRAVENOUS

## 2015-12-08 MED ORDER — SODIUM CHLORIDE 0.9 % IV SOLN: INTRAVENOUS | Status: DC

## 2015-12-08 MED ORDER — PRAMIPEXOLE DIHYDROCHLORIDE 0.25 MG PO TABS
0.2500 mg | ORAL_TABLET | Freq: Every evening | ORAL | Status: DC | PRN
  Administered 2015-12-08: 0.25 mg via ORAL
  Filled 2015-12-08: qty 1

## 2015-12-08 MED ORDER — MAGNESIUM OXIDE 400 (241.3 MG) MG PO TABS: 400.0000 mg | ORAL_TABLET | Freq: Every day | ORAL | Status: DC

## 2015-12-08 MED ORDER — CIPROFLOXACIN HCL 250 MG PO TABS
500.0000 mg | ORAL_TABLET | Freq: Two times a day (BID) | ORAL | Status: DC
  Administered 2015-12-08: 500 mg via ORAL
  Filled 2015-12-08: qty 1

## 2015-12-08 MED ORDER — SENNOSIDES-DOCUSATE SODIUM 8.6-50 MG PO TABS: 1.0000 | ORAL_TABLET | Freq: Every evening | ORAL | Status: DC | PRN

## 2015-12-08 MED ORDER — VITAMIN B-12 1000 MCG PO TABS: 1000.0000 ug | ORAL_TABLET | Freq: Every day | ORAL | Status: DC

## 2015-12-08 MED ORDER — LIDOCAINE HCL (CARDIAC) 20 MG/ML IV SOLN
INTRAVENOUS | Status: DC | PRN
  Administered 2015-12-08: 60 mg via INTRAVENOUS

## 2015-12-08 MED ORDER — SODIUM CHLORIDE 0.9 % IV BOLUS (SEPSIS)
1000.0000 mL | Freq: Once | INTRAVENOUS | Status: AC
  Administered 2015-12-08: 1000 mL via INTRAVENOUS

## 2015-12-08 MED ORDER — SODIUM CHLORIDE 0.9 % IV SOLN
80.0000 mg | Freq: Once | INTRAVENOUS | Status: AC
  Administered 2015-12-08: 80 mg via INTRAVENOUS
  Filled 2015-12-08: qty 80

## 2015-12-08 MED ORDER — SODIUM CHLORIDE 0.9 % IV SOLN: Freq: Once | INTRAVENOUS | Status: DC

## 2015-12-08 MED ORDER — BUDESONIDE-FORMOTEROL FUMARATE 160-4.5 MCG/ACT IN AERO
2.0000 | INHALATION_SPRAY | Freq: Two times a day (BID) | RESPIRATORY_TRACT | Status: DC
  Filled 2015-12-08: qty 6

## 2015-12-08 MED ORDER — TORSEMIDE 20 MG PO TABS: 10.0000 mg | ORAL_TABLET | Freq: Every day | ORAL | Status: DC

## 2015-12-08 MED ORDER — FERROUS SULFATE 325 (65 FE) MG PO TABS: 325.0000 mg | ORAL_TABLET | Freq: Every day | ORAL | Status: DC

## 2015-12-08 MED ORDER — TRIAMTERENE-HCTZ 37.5-25 MG PO TABS
2.0000 | ORAL_TABLET | Freq: Every day | ORAL | Status: DC
  Filled 2015-12-08: qty 2

## 2015-12-08 MED ORDER — ACETAMINOPHEN 325 MG PO TABS
650.0000 mg | ORAL_TABLET | Freq: Four times a day (QID) | ORAL | Status: DC | PRN
Start: 2015-12-08 — End: 2015-12-09

## 2015-12-08 MED ORDER — PANTOPRAZOLE SODIUM 40 MG IV SOLR
40.0000 mg | Freq: Two times a day (BID) | INTRAVENOUS | Status: DC
  Administered 2015-12-08: 40 mg via INTRAVENOUS
  Filled 2015-12-08: qty 40

## 2015-12-08 MED ORDER — PREGABALIN 75 MG PO CAPS
100.0000 mg | ORAL_CAPSULE | Freq: Three times a day (TID) | ORAL | Status: DC
  Administered 2015-12-08 (×2): 100 mg via ORAL
  Filled 2015-12-08 (×2): qty 2

## 2015-12-08 MED ORDER — OXYCODONE HCL ER 10 MG PO T12A
10.0000 mg | EXTENDED_RELEASE_TABLET | Freq: Every day | ORAL | Status: DC
Start: 2015-12-08 — End: 2015-12-09

## 2015-12-08 MED ORDER — ALBUTEROL SULFATE (2.5 MG/3ML) 0.083% IN NEBU: 2.5000 mg | INHALATION_SOLUTION | Freq: Four times a day (QID) | RESPIRATORY_TRACT | Status: DC | PRN

## 2015-12-08 MED ORDER — ONDANSETRON HCL 4 MG/2ML IJ SOLN
INTRAMUSCULAR | Status: AC
  Administered 2015-12-08: 4 mg via INTRAVENOUS
  Filled 2015-12-08: qty 2

## 2015-12-08 MED ORDER — SODIUM CHLORIDE 0.9 % IV SOLN
8.0000 mg/h | INTRAVENOUS | Status: DC
  Administered 2015-12-09: 8 mg/h via INTRAVENOUS
  Filled 2015-12-08 (×3): qty 80

## 2015-12-08 MED ORDER — LIPOIC ACID 150 MG PO CAPS: 300.0000 mg | ORAL_CAPSULE | Freq: Every evening | ORAL | Status: DC

## 2015-12-08 MED ORDER — CALCIUM CARBONATE ANTACID 500 MG PO CHEW: 1.0000 | CHEWABLE_TABLET | Freq: Every day | ORAL | Status: DC

## 2015-12-08 MED ORDER — PAROXETINE HCL 20 MG PO TABS: 20.0000 mg | ORAL_TABLET | Freq: Every day | ORAL | Status: DC

## 2015-12-08 MED ORDER — VITAMIN C 500 MG PO TABS: 500.0000 mg | ORAL_TABLET | Freq: Every day | ORAL | Status: DC

## 2015-12-08 MED ORDER — PROPOFOL 10 MG/ML IV BOLUS
INTRAVENOUS | Status: DC | PRN
  Administered 2015-12-08: 50 mg via INTRAVENOUS

## 2015-12-08 MED ORDER — ACETAMINOPHEN 325 MG PO TABS
650.0000 mg | ORAL_TABLET | Freq: Once | ORAL | Status: AC
  Administered 2015-12-08: 650 mg via ORAL
  Filled 2015-12-08: qty 2

## 2015-12-08 MED ORDER — ONDANSETRON HCL 4 MG/2ML IJ SOLN
4.0000 mg | Freq: Four times a day (QID) | INTRAMUSCULAR | Status: DC | PRN
  Administered 2015-12-09: 4 mg via INTRAVENOUS
  Filled 2015-12-08: qty 2

## 2015-12-08 MED ORDER — BISACODYL 5 MG PO TBEC: 5.0000 mg | DELAYED_RELEASE_TABLET | Freq: Every day | ORAL | Status: DC | PRN

## 2015-12-08 MED ORDER — ALUM & MAG HYDROXIDE-SIMETH 200-200-20 MG/5ML PO SUSP: 30.0000 mL | Freq: Four times a day (QID) | ORAL | Status: DC | PRN

## 2015-12-08 MED ORDER — SODIUM CHLORIDE 0.9 % IV SOLN
INTRAVENOUS | Status: DC
  Administered 2015-12-08 (×3): via INTRAVENOUS

## 2015-12-08 MED ORDER — SIMVASTATIN 20 MG PO TABS: 20.0000 mg | ORAL_TABLET | Freq: Every evening | ORAL | Status: DC

## 2015-12-08 MED ORDER — ACETAMINOPHEN 650 MG RE SUPP
650.0000 mg | Freq: Four times a day (QID) | RECTAL | Status: DC | PRN
Start: 2015-12-08 — End: 2015-12-09

## 2015-12-08 MED ORDER — VITAMIN D 1000 UNITS PO TABS: 2000.0000 [IU] | ORAL_TABLET | Freq: Every day | ORAL | Status: DC

## 2015-12-08 MED ORDER — OMEGA-3-ACID ETHYL ESTERS 1 G PO CAPS
1.0000 | ORAL_CAPSULE | Freq: Two times a day (BID) | ORAL | Status: DC
  Administered 2015-12-08: 1 g via ORAL
  Filled 2015-12-08: qty 1

## 2015-12-08 NOTE — H&P (Signed)
[facility] NAME: Lisa Becker    MR#:  [id number]  DATE OF BIRTH:  [birth year]  DATE OF ADMISSION:  12/08/2015  PRIMARY CARE PHYSICIAN: Rusty Aus., MD   REQUESTING/REFERRING PHYSICIAN: Dr Hedwig Morton  CHIEF COMPLAINT:  Hematemesis  HISTORY OF PRESENT ILLNESS:  Lisa Becker  is a 82 y.o. female with a known history of essential hypertension, OSA and pulmonary hypertension presents with above complaint. Patient has been in usual state of health except for this morning. Patient states this morning she is feeling nauseous and had 3 episodes of hematemesis. When she arrived in the emergency room she had a bowel movement with blood. She does state that she has been constipated over the past 2 days. She took a laxative at 12:30 AM. Both his bowel movements after that she was straining and did notice blood. She is taking an aspirin on a daily basis but does not take any other NSAIDs. She has chronic abdominal pain on the lower left quadrant and right quadrant which was evaluated back in August by GI with colonoscopy and barium enema. It is felt that she likely has scar tissue causing the abdominal pain.  PAST MEDICAL HISTORY:   Past Medical History  Diagnosis Date  . Hypertension   . Anemia   . Sleep apnea     CPAP in use q night   . Pulmonary hypertension ([place])     followed by Dr. Raul Del, [facility], PFT's done at that last visit- mid April- 2015  . Shortness of breath   . Depression   . Nervous indigestion     uses one TUM, if this occurs (maybe once per month)    . Arthritis     lumbar stenosis  . Neuromuscular disorder (HCC)     neuropathy hands, feet, legs   . Cancer ([place]) 2010    ovarian, reoccurence - 2013, treatment completed through porta cath. 2014   . Asthma     PAST SURGICAL HISTORY:   Past Surgical History  Procedure Laterality Date  . Total hip arthroplasty  2013  . Knee surgery Left 2006  . Eus   08/20/2012    Procedure: UPPER ENDOSCOPIC ULTRASOUND (EUS) LINEAR;  Surgeon: Milus Banister, MD;  Location: [facility] ENDOSCOPY;  Service: Endoscopy;  Laterality: N/A;  radial/ linear   . Joint replacement Right     both knees have been replaced    . Tonsillectomy    . Abdominal hysterectomy    . Back surgery  2003    prior to 2003 episode, had back surgery (lumbar)- 1989  . Laparotomy  2013    for mass in abdomen   . Blepharoplasty    . Colonoscopy with propofol N/A 06/09/2015    Procedure: COLONOSCOPY WITH PROPOFOL;  Surgeon: Hulen Luster, MD;  Location: [facility] ENDOSCOPY;  Service: Gastroenterology;  Laterality: N/A;    SOCIAL HISTORY:   Social History  Substance Use Topics  . Smoking status: Never Smoker   . Smokeless tobacco: Not on file  . Alcohol Use: No    FAMILY HISTORY:   Family History  Problem Relation Age of Onset  . Breast cancer Sister 34  . Breast cancer Maternal Aunt     DRUG ALLERGIES:   Allergies  Allergen Reactions  . Amlodipine Swelling    "leg swelling"  . Metoclopramide Other (See Comments)    "interferes with my pramipexole"  . Metronidazole Diarrhea    "upset stomach"  .  Tramadol Hcl Er Other (See Comments)    "hold fluid"     REVIEW OF SYSTEMS:  CONSTITUTIONAL: No fever, fatigue or weakness.  EYES: No blurred or double vision.  EARS, NOSE, AND THROAT: No tinnitus or ear pain.  RESPIRATORY: No cough, shortness of breath, wheezing or hemoptysis.  CARDIOVASCULAR: No chest pain, orthopnea, edema.  GASTROINTESTINAL: She had hematemesis, nausea and abdominal pain as mentioned above. She had blood in her stools after straining for bowel movement GENITOURINARY: No dysuria, hematuria.  ENDOCRINE: No polyuria, nocturia,  HEMATOLOGY: No anemia, easy bruising or bleeding SKIN: No rash or lesion. MUSCULOSKELETAL: No joint pain or arthritis.   NEUROLOGIC: No tingling, numbness, weakness.  PSYCHIATRY: She has anxiety MEDICATIONS AT HOME:   Prior to  Admission medications   Medication Sig Start Date End Date Taking? Authorizing Provider  albuterol (PROVENTIL HFA;VENTOLIN HFA) 108 (90 BASE) MCG/ACT inhaler Inhale 2 puffs into the lungs every 6 (six) hours as needed for wheezing or shortness of breath.   Yes Historical Provider, MD  Alpha-Lipoic Acid (LIPOIC ACID PO) Take 300 mg by mouth every evening.    Yes Historical Provider, MD  aspirin EC 81 MG tablet Take 81 mg by mouth daily.   Yes Historical Provider, MD  budesonide-formoterol (SYMBICORT) 160-4.5 MCG/ACT inhaler Inhale 2 puffs into the lungs 2 (two) times daily.   Yes Historical Provider, MD  calcium carbonate (TUMS - DOSED IN MG ELEMENTAL CALCIUM) 500 MG chewable tablet Chew 1 tablet by mouth daily.   Yes Historical Provider, MD  Cholecalciferol (VITAMIN D3) 2000 UNITS TABS Take 2,000 Units by mouth daily with breakfast.    Yes Historical Provider, MD  ciprofloxacin (CIPRO) 500 MG tablet Take 1 tablet (500 mg total) by mouth 2 (two) times daily. 12/06/15  Yes Forest Gleason, MD  Coenzyme Q10-Fish Oil-Vit E (CO-Q 10 OMEGA-3 FISH OIL) CAPS Take 1 capsule by mouth 2 (two) times daily.    Yes Historical Provider, MD  Iron-Vitamin C (VITRON-C PO) Take 1 tablet by mouth daily with breakfast.    Yes Historical Provider, MD  Magnesium Oxide 250 MG TABS Take 1 tablet by mouth daily.   Yes Historical Provider, MD  ondansetron (ZOFRAN) 4 MG tablet Take 4 mg by mouth every 6 (six) hours as needed for nausea.   Yes Historical Provider, MD  oxyCODONE (OXYCONTIN) 10 mg 12 hr tablet Take 1 tablet (10 mg total) by mouth daily after supper. 12/05/15  Yes Forest Gleason, MD  PARoxetine (PAXIL) 20 MG tablet Take 20 mg by mouth daily.   Yes Historical Provider, MD  pramipexole (MIRAPEX) 0.25 MG tablet Take 0.25 mg by mouth at bedtime as needed and may repeat dose one time if needed (restless leg).    Yes Historical Provider, MD  pregabalin (LYRICA) 100 MG capsule Take 100 mg by mouth 3 (three) times daily.   11/07/15 12/08/15 Yes Historical Provider, MD  simvastatin (ZOCOR) 20 MG tablet Take 20 mg by mouth every evening.   Yes Historical Provider, MD  torsemide (DEMADEX) 20 MG tablet Take 10 mg by mouth daily with breakfast.    Yes Historical Provider, MD  triamterene-hydrochlorothiazide (MAXZIDE-25) 37.5-25 MG per tablet Take 2 tablets by mouth daily.   Yes Historical Provider, MD  vitamin B-12 (CYANOCOBALAMIN) 1000 MCG tablet Take 1,000 mcg by mouth daily with breakfast.    Yes Historical Provider, MD  oxyCODONE (ROXICODONE) 5 MG immediate release tablet Take 1 tablet (5 mg total) by mouth every 6 (six) hours as needed  for moderate pain or severe pain. 05/30/15 05/29/16  Delman Kitten, MD      VITAL SIGNS:  Blood pressure 120/22, pulse 75, temperature 98 F (36.7 C), temperature source Oral, resp. rate 19, height 5' 1.5" (1.562 m), weight 101.7 kg (224 lb 3.3 oz), SpO2 100 %.  PHYSICAL EXAMINATION:  GENERAL:  82 y.o.-year-old patient lying in the bed with no acute distress.  EYES: Pupils equal, round, reactive to light and accommodation. No scleral icterus. Extraocular muscles intact.  HEENT: Head atraumatic, normocephalic. Oropharynx and nasopharynx clear.  NECK:  Supple, no jugular venous distention. No thyroid enlargement, no tenderness.  LUNGS: Normal breath sounds bilaterally, no wheezing, rales,rhonchi or crepitation. No use of accessory muscles of respiration.  CARDIOVASCULAR: S1, S2 normal. No murmurs, rubs, or gallops.  ABDOMEN: Soft, nontender, nondistended. Bowel sounds present. No organomegaly or mass.  EXTREMITIES: No pedal edema, cyanosis, or clubbing.  NEUROLOGIC: Cranial nerves II through XII are grossly intact. No focal deficits. PSYCHIATRIC: The patient is alert and oriented x 3.  SKIN: No obvious rash, lesion, or ulcer.   LABORATORY PANEL:   CBC  Recent Labs Lab 12/05/15 1215  WBC 8.2  HGB 10.6*  HCT 32.5*  PLT 234    ------------------------------------------------------------------------------------------------------------------  Chemistries   Recent Labs Lab 12/08/15 1155  NA 139  K 3.6  CL 95*  CO2 32  GLUCOSE 147*  BUN 35*  CREATININE 1.26*  CALCIUM 8.8*  AST 27  ALT 18  ALKPHOS 61  BILITOT 0.5   ------------------------------------------------------------------------------------------------------------------  Cardiac Enzymes No results for input(s): TROPONINI in the last 168 hours. ------------------------------------------------------------------------------------------------------------------  RADIOLOGY:  No results found.  EKG:  Sinus rhythm with PVCs  IMPRESSION AND PLAN:    82 year old female with a history of essential hypertension, pulmonary hypertension and OSA who presents with hematemesis.  1. Hematemesis and rectal bleeding: Patient will be evaluated by GI. ED physician has already spoken with GI physician on-call. She had a colonoscopy back in August which included colonoscopy and barium enema which she reports was negative. Discontinue aspirin for now and continue PPI. Follow hemoglobin every 6 hours.  2. OSA on CPAP machine. I have ordered CPAP for night.  3. Essential hypertension: Continue Maxide and torsemide.  4. Recent urinary tract infection: Continue ciprofloxacin.  5. Hyperlipidemia: Continue Zocor  6. Anxiety: Continue Paxil   All the records are reviewed and case discussed with ED provider. Management plans discussed with the patient and she is in agreement.  CODE STATUS: DO NOT RESUSCITATE  TOTAL TIME TAKING CARE OF THIS PATIENT: 50 minutes.    Platon Arocho M.D on 12/08/2015 at 1:55 PM  Between 7am to 6pm - Pager - [phone] After 6pm go to [email or web address] - password [email or web address] [facility]  [facility]  Office  [phone]  CC: Primary care physician; Rusty Aus., MD

## 2015-12-08 NOTE — Anesthesia Preprocedure Evaluation (Addendum)
Anesthesia Evaluation  Patient identified by MRN, date of birth, ID band Patient awake    Reviewed: Allergy & Precautions, NPO status , Patient's Chart, lab work & pertinent test results, reviewed documented beta blocker date and time   Airway Mallampati: II  TM Distance: >3 FB     Dental  (+) Chipped   Pulmonary shortness of breath, asthma , sleep apnea and Continuous Positive Airway Pressure Ventilation ,           Cardiovascular hypertension, Pt. on medications      Neuro/Psych PSYCHIATRIC DISORDERS Depression  Neuromuscular disease    GI/Hepatic   Endo/Other    Renal/GU      Musculoskeletal  (+) Arthritis ,   Abdominal   Peds  Hematology  (+) anemia ,   Anesthesia Other Findings Will use cpap tonite. Hx of PVCs. Took asthma meds this am. Obese. No cardiac symptoms. Ovarian Ca with extensive mets. Renal insufficiency. Anemic Hb 9. EKG poor R wave progression and PVCs, otherwise ok.  Reproductive/Obstetrics                           Anesthesia Physical Anesthesia Plan  ASA: III  Anesthesia Plan: General   Post-op Pain Management:    Induction: Intravenous  Airway Management Planned: Nasal Cannula  Additional Equipment:   Intra-op Plan:   Post-operative Plan:   Informed Consent: I have reviewed the patients History and Physical, chart, labs and discussed the procedure including the risks, benefits and alternatives for the proposed anesthesia with the patient or authorized representative who has indicated his/her understanding and acceptance.     Plan Discussed with: CRNA  Anesthesia Plan Comments:         Anesthesia Quick Evaluation

## 2015-12-08 NOTE — Progress Notes (Signed)
Dr Lavetta Nielsen notified for permission to access pt port a cath. Appropriate orders placed.

## 2015-12-08 NOTE — Interval H&P Note (Signed)
History and Physical Interval Note:  12/08/2015 5:32 PM  Lisa Becker  has presented today for surgery, with the diagnosis of hematemesis  The various methods of treatment have been discussed with the patient and family. After consideration of risks, benefits and other options for treatment, the patient has consented to  Procedure(s): ESOPHAGOGASTRODUODENOSCOPY (EGD) WITH PROPOFOL (N/A) as a surgical intervention .  The patient's history has been reviewed, patient examined, no change in status, stable for surgery.  I have reviewed the patient's chart and labs.  Questions were answered to the patient's satisfaction.     REIN, MATTHEW GORDON

## 2015-12-08 NOTE — Progress Notes (Signed)
Dr. Benjie Karvonen notified of blood pressure. New orders placed.

## 2015-12-08 NOTE — ED Notes (Signed)
Patient arrived by EMS with c/o hematemesis. Patient reports this occuring X1 this morning. Patient then called EMS. Patient has HX of ovarian cancer that has metastasized to lungs. Patient denies any symptoms, just came in for emesis blood.

## 2015-12-08 NOTE — Anesthesia Postprocedure Evaluation (Signed)
Anesthesia Post Note  Patient: JASMEEN GEISS  Procedure(s) Performed: Procedure(s) (LRB): ESOPHAGOGASTRODUODENOSCOPY (EGD) WITH PROPOFOL (N/A)  Patient location during evaluation: Endoscopy Anesthesia Type: General Level of consciousness: awake and alert Pain management: pain level controlled Vital Signs Assessment: post-procedure vital signs reviewed and stable Respiratory status: spontaneous breathing, nonlabored ventilation, respiratory function stable and patient connected to nasal cannula oxygen Cardiovascular status: blood pressure returned to baseline and stable Postop Assessment: no signs of nausea or vomiting Anesthetic complications: no    Last Vitals:  Filed Vitals:   12/08/15 1820 12/08/15 1830  BP: 103/69 124/63  Pulse: 85 83  Temp:    Resp: 16 27    Last Pain:  Filed Vitals:   12/08/15 1831  PainSc: 2                  Avalie Oconnor S

## 2015-12-08 NOTE — Progress Notes (Signed)
EGD shows very large amount of clotted blood in the gastric body. Patient's blood pressure is low. Hemoglobin is 9. I will give 1 L bolus. Stop hypertensive medications. Due to low blood pressure will transfuse 1 unit PRBC. Transfusion was discussed with the patient while patient was in the emergency room prior to admission.

## 2015-12-08 NOTE — Transfer of Care (Signed)
Immediate Anesthesia Transfer of Care Note  Patient: Lisa Becker  Procedure(s) Performed: Procedure(s): ESOPHAGOGASTRODUODENOSCOPY (EGD) WITH PROPOFOL (N/A)  Patient Location: PACU  Anesthesia Type:General  Level of Consciousness: awake and patient cooperative  Airway & Oxygen Therapy: Patient Spontanous Breathing and Patient connected to nasal cannula oxygen  Post-op Assessment: Report given to RN  Post vital signs: Reviewed and stable  Last Vitals:  Filed Vitals:   12/08/15 1513 12/08/15 1759  BP: 126/59 99/78  Pulse: 98 94  Temp: 36.6 C 97.5F  Resp: 18 16    Complications: No apparent anesthesia complications

## 2015-12-08 NOTE — ED Provider Notes (Addendum)
[facility] Emergency Department Provider Note  Time seen: 11:52 AM  I have reviewed the triage vital signs and the nursing notes.   HISTORY  Chief Complaint Hematemesis    HPI Lisa Becker is a 82 y.o. female with a past medical history of hypertension, pulmonary hypertension, arthritis, asthma who presents the emergency department after an episode of bloody vomit. According to the patient she was feeling fine this morning, they went shopping, no distress. At approximately 10:30 AM the patient became acutely nauseated and had several episodes of very large amount of red vomit. Per EMS the vomit was consistent with blood. Patient's family member took a photo of the vomit, it is concerning for blood. Patient denies any history of bleeding in the past. Denies bloody vomit or GI bleeds in the past. Is not sure if she has had darker red stool recently.Denies any abdominal pain besides some mild lower abdominal pain which she has had for greater than 6 months, which has been worked up by GI medicine including a colonoscopy and barium enema. Denies any acute change in this discomfort. Denies fever. Denies cough or congestion. Currently states she feels well denies any nausea. EMS states initial blood pressure of 88 systolic, currently 0000000.     Past Medical History  Diagnosis Date  . Hypertension   . Anemia   . Sleep apnea     CPAP in use q night   . Pulmonary hypertension ([place])     followed by Dr. Raul Del, Jefm Bryant, PFT's done at that last visit- mid April- 2015  . Shortness of breath   . Depression   . Nervous indigestion     uses one TUM, if this occurs (maybe once per month)    . Arthritis     lumbar stenosis  . Neuromuscular disorder (HCC)     neuropathy hands, feet, legs   . Cancer ([place]) 2010    ovarian, reoccurence - 2013, treatment completed through porta cath. 2014   . Asthma     Patient Active Problem List   Diagnosis Date Noted  . 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 Asthma without status asthmaticus 05/31/2015  . Adult idiopathic generalized osteoporosis 04/12/2015  . Personal history of healed traumatic fracture 04/12/2015  . Septic shock ([place]) 03/31/2015  . Sacral fracture ([place]) 03/31/2015  . Ovarian cancer ([place]) 03/31/2015  . UTI (lower urinary tract infection) 03/31/2015  . H/O ovarian cancer 11/25/2014  . SCC (squamous cell carcinoma), face 08/31/2014  . B12 deficiency 06/09/2014  . Disc disorder 06/09/2014  . Neuropathy involving both lower extremities ([place]) 06/09/2014  . Lumbago 05/08/2014  . Spondylolisthesis of lumbar region 05/05/2014  . Obstructive apnea 03/18/2014  . Hypertensive pulmonary vascular disease ([place]) 03/18/2014  . Pulmonary hypertension ([place]) 03/18/2014    Past Surgical History  Procedure Laterality Date  . Total hip arthroplasty  2013  . Knee surgery Left 2006  . Eus  08/20/2012    Procedure: UPPER ENDOSCOPIC ULTRASOUND (EUS) LINEAR;  Surgeon: Milus Banister, MD;  Location: [facility] ENDOSCOPY;  Service: Endoscopy;  Laterality: N/A;  radial/ linear   . Joint replacement Right     both knees have been replaced    . Tonsillectomy    . Abdominal hysterectomy    . Back surgery  2003    prior to 2003 episode, had back surgery (lumbar)- 1989  . Laparotomy  2013    for mass in abdomen   .  Blepharoplasty    . Colonoscopy with propofol N/A 06/09/2015    Procedure: COLONOSCOPY WITH PROPOFOL;  Surgeon: Hulen Luster, MD;  Location: [facility] ENDOSCOPY;  Service: Gastroenterology;  Laterality: N/A;    Current Outpatient Rx  Name  Route  Sig  Dispense  Refill  . albuterol (PROVENTIL HFA;VENTOLIN HFA) 108 (90 BASE) MCG/ACT inhaler   Inhalation   Inhale 2 puffs into the lungs every 6 (six) hours as needed for wheezing or shortness of breath.         . Alpha-Lipoic Acid (LIPOIC ACID PO)   Oral   Take 300 mg by mouth every evening.          Marland Kitchen aspirin  EC 81 MG tablet   Oral   Take 81 mg by mouth daily.         . budesonide-formoterol (SYMBICORT) 160-4.5 MCG/ACT inhaler   Inhalation   Inhale 2 puffs into the lungs 2 (two) times daily.         . calcium carbonate (TUMS - DOSED IN MG ELEMENTAL CALCIUM) 500 MG chewable tablet   Oral   Chew 1 tablet by mouth daily.         . Cholecalciferol (VITAMIN D3) 2000 UNITS TABS   Oral   Take 2,000 Units by mouth daily with breakfast.          . ciprofloxacin (CIPRO) 500 MG tablet   Oral   Take 1 tablet (500 mg total) by mouth 2 (two) times daily.   14 tablet   0   . Coenzyme Q10-Fish Oil-Vit E (CO-Q 10 OMEGA-3 FISH OIL) CAPS   Oral   Take 1 capsule by mouth 2 (two) times daily.          . Iron-Vitamin C (VITRON-C PO)   Oral   Take 1 tablet by mouth daily with breakfast.          . Magnesium Oxide 250 MG TABS   Oral   Take 1 tablet by mouth daily.         . ondansetron (ZOFRAN) 4 MG tablet   Oral   Take 4 mg by mouth every 6 (six) hours as needed for nausea.         Marland Kitchen oxyCODONE (OXYCONTIN) 10 mg 12 hr tablet   Oral   Take 1 tablet (10 mg total) by mouth daily after supper.   15 tablet   0   . PARoxetine (PAXIL) 20 MG tablet   Oral   Take 20 mg by mouth daily.         . pramipexole (MIRAPEX) 0.25 MG tablet   Oral   Take 0.25 mg by mouth at bedtime as needed and may repeat dose one time if needed (restless leg).          . pregabalin (LYRICA) 100 MG capsule   Oral   Take 100 mg by mouth 3 (three) times daily.          . simvastatin (ZOCOR) 20 MG tablet   Oral   Take 20 mg by mouth every evening.         . torsemide (DEMADEX) 20 MG tablet   Oral   Take 10 mg by mouth daily with breakfast.          . triamterene-hydrochlorothiazide (MAXZIDE-25) 37.5-25 MG per tablet   Oral   Take 2 tablets by mouth daily.         . vitamin B-12 (CYANOCOBALAMIN) 1000 MCG tablet  Oral   Take 1,000 mcg by mouth daily with breakfast.          .  oxyCODONE (ROXICODONE) 5 MG immediate release tablet   Oral   Take 1 tablet (5 mg total) by mouth every 6 (six) hours as needed for moderate pain or severe pain.   4 tablet   0     Allergies Amlodipine; Metoclopramide; Metronidazole; and Tramadol hcl er  Family History  Problem Relation Age of Onset  . Breast cancer Sister 37  . Breast cancer Maternal Aunt     Social History Social History  Substance Use Topics  . Smoking status: Never Smoker   . Smokeless tobacco: None  . Alcohol Use: No    Review of Systems Constitutional: Negative for fever. Cardiovascular: Negative for chest pain. Respiratory: Negative for shortness of breath. Gastrointestinal: Negative for abdominal pain. Vomiting this morning at 10:30 AM consistent with blood. Genitourinary: Negative for dysuria. Neurological: Negative for headache 10-point ROS otherwise negative.  ____________________________________________   PHYSICAL EXAM:  VITAL SIGNS: ED Triage Vitals  Enc Vitals Group     BP 12/08/15 1111 123/67 mmHg     Pulse Rate 12/08/15 1111 83     Resp 12/08/15 1111 13     Temp 12/08/15 1111 98 F (36.7 C)     Temp Source 12/08/15 1111 Oral     SpO2 12/08/15 1111 99 %     Weight 12/08/15 1111 224 lb 3.3 oz (101.7 kg)     Height 12/08/15 1111 5' 1.5" (1.562 m)     Head Cir --      Peak Flow --      Pain Score --      Pain Loc --      Pain Edu? --      Excl. in [place]? --     Constitutional: Alert and oriented. Well appearing and in no distress. Eyes: Normal exam ENT   Head: Normocephalic and atraumatic.   Mouth/Throat: Mucous membranes are moist. Cardiovascular: Normal rate, regular rhythm. No murmur Respiratory: Normal respiratory effort without tachypnea nor retractions. Breath sounds are clear  Gastrointestinal: Soft and nontender. No distention.   Musculoskeletal: Nontender with normal range of motion in all extremities.  Neurologic:  Normal speech and language. No gross focal  neurologic deficits Skin:  Skin is warm, dry and intact.  Psychiatric: Mood and affect are normal. Speech and behavior are normal.   ____________________________________________    EKG  EKG reviewed and interpreted by myself shows normal sinus rhythm at 83 bpm, narrow QRS, normal axis, normal intervals, nonspecific ST changes. No ST elevations.  ____________________________________________      INITIAL IMPRESSION / ASSESSMENT AND PLAN / ED COURSE  Pertinent labs & imaging results that were available during my care of the patient were reviewed by me and considered in my medical decision making (see chart for details).  Patient presents to the emergency department after several episodes of vomiting suspicious for hematemesis. No history of GI bleed in the past. We will check labs including type and screen. Place the patient on a Protonix bolus and infusion, IV hydrate, and closely monitor in the emergency department.  Patient's stool sample is dark brown, but strongly guaiac positive. This is more concerning for an upper GI bleed. Patient currently on Protonix drip. We'll admit to the hospitalist labs have resulted for likely upper GI bleed. I will call GI medicine to discuss this.  ----------------------------------------- 1:18 PM on 12/08/2015 -----------------------------------------  I discussed the  patient with Dr. Rayann Heman.  We will plan to admit to the hospital for further treatment and evaluation.  Discussed the plan of care with the hospitalist, they will admit for further treatment. Possible endoscopy today.  CRITICAL CARE Performed by: Harvest Dark   Total critical care time: 30 minutes  Critical care time was exclusive of separately billable procedures and treating other patients.  Critical care was necessary to treat or prevent imminent or life-threatening deterioration.  Critical care was time spent personally by me on the following activities: development of  treatment plan with patient and/or surrogate as well as nursing, discussions with consultants, evaluation of patient's response to treatment, examination of patient, obtaining history from patient or surrogate, ordering and performing treatments and interventions, ordering and review of laboratory studies, ordering and review of radiographic studies, pulse oximetry and re-evaluation of patient's condition.    Significant lipase elevation consistent with acute pancreatitis. Patient has no upper abdominal pain. Currently awaiting CBC results for admitting the patient to the hospital.  Labs have resulted. Hemoglobin of 9.8. Patient admitted to the hospital for further treatment and evaluation.   ____________________________________________   FINAL CLINICAL IMPRESSION(S) / ED DIAGNOSES  GI bleed Hematemesis   Harvest Dark, MD 12/08/15 [place], MD 12/08/15 [phone]

## 2015-12-08 NOTE — Progress Notes (Signed)
PHARMACIST - PHYSICIAN ORDER COMMUNICATION  CONCERNING: P&T Medication Policy on Herbal Medications  DESCRIPTION:  This patient's order for:  Lipoic Acid   has been noted.  This product(s) is classified as an "herbal" or natural product. Due to a lack of definitive safety studies or FDA approval, nonstandard manufacturing practices, plus the potential risk of unknown drug-drug interactions while on inpatient medications, the Pharmacy and Therapeutics Committee does not permit the use of "herbal" or natural products of this type within [facility].   ACTION TAKEN: The pharmacy department is unable to verify this order at this time and your patient has been informed of this safety policy. Please reevaluate patient's clinical condition at discharge and address if the herbal or natural product(s) should be resumed at that time.

## 2015-12-08 NOTE — Consult Note (Signed)
GI Inpatient Consult Note  Reason for Consult: Hematemesis   Attending Requesting Consult: [place]  History of Present Illness: Lisa Becker is a 82 y.o. female who was brought to the [facility] ED by EMS after 2-3 episodes of hematemesis this morning (2/27).  She has a known history of recurrent and progressive ovarian cancer (for which she has chosen not to seek treatment), HTN, OSA and pulmonary HTN.  She states that she has felt fine leading up to today, and even went shopping this morning.  Mid-morning, she had one episode of hematemesis - dark red blood.  Her daughter looked at it and states that she felt it filled the bottom of a trash can.  She vomited again before EMS was called.  EMS said that it looked like blood when they arrived, and she was told her BP was 88/66.  She has never had anything like this before.  She denies any NSAID use, GERD-like symptoms, dysphagia, or anti-coagulant use. She did not feel nauseous until after the vomiting episodes.  She felt like she was going to pass out after the first episode of vomiting.  While this provider was in the pts room in the ED, she began to have severe lower abdominal cramping and needed to have a BM.  After the BM, the toilet was filled with bright red blood, uncertain about the amount of stool d/t the blood.  She was still in pain but the cramping eased off after passing stool.  She felt dizzy after the bowel movement, like she was going to pass out.  She states that her "normal" is to be constipated d/t the pain medications she is on for her cancer.  She takes a stool softener regularly, and she took 2 pills this morning to be able to have a bowel movement.  She had to strain to go, but then had 2 more BMs after that.  She denies seeing any blood in the toilet or blood on the tissue.  She has a bowel movement every day.  She has had LLQ pain since August (prompted a colonoscopy),  and has had RLQ pain for the last few days and is being treated for  a UTI with cipro (Dr. Oliva Bustard).  Her abdominal pain is not made better or worse with bowel movements.     She denies any CP, new SOB (she is SOB at baseline with activity), appetite or weight changes, and feels that her energy level is really good. There's no Fhx of colon or rectal cancer to her knowledge. She does not remember ever having an upper endoscopy to look at her stomach and duodenum; she had an endoscopy [place] 08/20/12 to monitor her cervical Ca. She has been treated in the past for partial bowel obstructions.  Colonoscopy 06/09/15 (Dr. Candace Cruise): - Diverticulosis in the sigmoid colon. - One diminutive polyp in the transverse colon (no path performed, unable to be retreived) - The examination was otherwise normal.  - Some fixation of the colon was noted requiring pediatric scope to get through sigmoid.  Past Medical History:  Past Medical History  Diagnosis Date  . Hypertension   . Anemia   . Sleep apnea     CPAP in use q night   . Pulmonary hypertension ([place])     followed by Dr. Raul Del, Jefm Bryant, PFT's done at that last visit- mid April- 2015  . Shortness of breath   . Depression   . Nervous indigestion     uses one TUM,  if this occurs (maybe once per month)    . Arthritis     lumbar stenosis  . Neuromuscular disorder (HCC)     neuropathy hands, feet, legs   . Cancer ([place]) 2010    ovarian, reoccurence - 2013, treatment completed through porta cath. 2014   . Asthma     Problem List: Patient Active Problem List   Diagnosis Date Noted  . Primary malignant neoplasm of ovary with widespread metastatic disease ([place]) 06/05/2015  . Asthma without status asthmaticus 05/31/2015  . Adult idiopathic generalized osteoporosis 04/12/2015  . Personal history of healed traumatic fracture 04/12/2015  . Septic shock ([place]) 03/31/2015  . Sacral fracture ([place]) 03/31/2015  . Ovarian cancer ([place]) 03/31/2015  . UTI (lower urinary tract infection) 03/31/2015  . H/O ovarian cancer 11/25/2014  .  SCC (squamous cell carcinoma), face 08/31/2014  . B12 deficiency 06/09/2014  . Disc disorder 06/09/2014  . Neuropathy involving both lower extremities ([place]) 06/09/2014  . Lumbago 05/08/2014  . Spondylolisthesis of lumbar region 05/05/2014  . Obstructive apnea 03/18/2014  . Hypertensive pulmonary vascular disease ([place]) 03/18/2014  . Pulmonary hypertension ([place]) 03/18/2014    Past Surgical History: Past Surgical History  Procedure Laterality Date  . Total hip arthroplasty  2013  . Knee surgery Left 2006  . Eus  08/20/2012    Procedure: UPPER ENDOSCOPIC ULTRASOUND (EUS) LINEAR;  Surgeon: Milus Banister, MD;  Location: [facility] ENDOSCOPY;  Service: Endoscopy;  Laterality: N/A;  radial/ linear   . Joint replacement Right     both knees have been replaced    . Tonsillectomy    . Abdominal hysterectomy    . Back surgery  2003    prior to 2003 episode, had back surgery (lumbar)- 1989  . Laparotomy  2013    for mass in abdomen   . Blepharoplasty    . Colonoscopy with propofol N/A 06/09/2015    Procedure: COLONOSCOPY WITH PROPOFOL;  Surgeon: Hulen Luster, MD;  Location: [facility] ENDOSCOPY;  Service: Gastroenterology;  Laterality: N/A;    Allergies: Allergies  Allergen Reactions  . Amlodipine Swelling    "leg swelling"  . Metoclopramide Other (See Comments)    "interferes with my pramipexole"  . Metronidazole Diarrhea    "upset stomach"  . Tramadol Hcl Er Other (See Comments)    "hold fluid"    Home Medications:  (Not in a hospital admission) Home medication reconciliation was completed with the patient.   Scheduled Inpatient Medications:     Continuous Inpatient Infusions:   . pantoprozole (PROTONIX) infusion 8 mg/hr (12/08/15 1240)    PRN Inpatient Medications:    Family History: family history includes Breast cancer in her maternal aunt; Breast cancer (age of onset: 103) in her sister.   Social History:   reports that she has never smoked. She does not have any smokeless  tobacco history on file. She reports that she does not drink alcohol or use illicit drugs.   Review of Systems: Constitutional: Weight is stable.  Eyes: No changes in vision. ENT: No oral lesions, sore throat.  GI: see HPI.  Heme/Lymph: No easy bruising.  CV: No chest pain.  GU: No hematuria.  Integumentary: No rashes.  Neuro: No headaches.  Psych: No depression/anxiety.  Endocrine: No heat/cold intolerance.  Allergic/Immunologic: No urticaria.  Resp: No cough, no increased SOB.  Musculoskeletal: No joint swelling.    Physical Examination: BP 123/67 mmHg  Pulse 83  Temp(Src) 98 F (36.7 C) (Oral)  Resp 13  Ht  5' 1.5" (1.562 m)  Wt 101.7 kg (224 lb 3.3 oz)  BMI 41.68 kg/m2  SpO2 99% Gen: NAD, alert and oriented x 4 HEENT: PEERLA, EOMI, Neck: supple, no JVD or thyromegaly Chest: CTA bilaterally, no wheezes, crackles, or other adventitious sounds CV: RRR, no m/g/c/r Abd: soft, NT, ND, +BS in all four quadrants; no HSM, guarding, ridigity, or rebound tenderness Ext: no edema, well perfused with 2+ pulses, Skin: no rash or lesions noted Lymph: no LAD  Data: Lab Results  Component Value Date   WBC 8.2 12/05/2015   HGB 10.6* 12/05/2015   HCT 32.5* 12/05/2015   MCV 85.5 12/05/2015   PLT 234 12/05/2015    Recent Labs Lab 12/05/15 1215  HGB 10.6*   Lab Results  Component Value Date   NA 134* 12/05/2015   K 3.4* 12/05/2015   CL 94* 12/05/2015   CO2 30 12/05/2015   BUN 33* 12/05/2015   CREATININE 1.07* 12/05/2015   Lab Results  Component Value Date   ALT 20 12/05/2015   AST 26 12/05/2015   ALKPHOS 70 12/05/2015   BILITOT 0.4 12/05/2015   No results for input(s): APTT, INR, PTT in the last 168 hours.  Assessment/Plan: Ms. Kazanjian is a 82 y.o. female history of recurrent and progressive ovarian cancer (for which she has chosen not to seek treatment), HTN, OSA and pulmonary HTN admitted with hematemesis.  Patient reported 2 episode of hematemesis this morning.  She also experienced one large volume of BRBPR while in the ED.  Hgb dropped from 9.8 to 9.0 since arriving in the ED.  At this time, Hgb and hemodynamics appear stable.  CT chest is also reassuring for acute etiologies. Given hematemesis and acute-onset, large volume BRBPR, recommend EGD to evaluate acute bleeding of a gastric ulcer.  Further recs regarding procedure per Dr. Rayann Heman.  Will continue to follow.  Recommendations: - Monitor Hgb, transfuse if <7 - Continue Protonix drip - Plan for EGD - further recs per Dr. Rayann Heman  Thank you for the consult. We will follow along with you. Please call with questions or concerns.  Lavera Guise, PA-C [facility] Gastroenterology Phone: [phone] Pager: [phone]

## 2015-12-08 NOTE — Op Note (Signed)
[facility] Gastroenterology Patient Name: Lisa Becker Procedure Date: 12/08/2015 5:24 PM MRN: [id number] Account #: [id number] Date of Birth: Nov 13, 1933 Admit Type: Inpatient Age: 82 Room: [facility] ENDO ROOM 4 Gender: Female Note Status: Finalized Procedure:         Upper GI endoscopy Indications:       Acute post hemorrhagic anemia, Hematemesis, Hematochezia Patient Profile:   This is a 82 year old female. Providers:         Gerrit Heck. Rayann Heman, MD Referring MD:      Rusty Aus, MD (Referring MD) Medicines:         Propofol per Anesthesia Complications:     No immediate complications. Procedure:         Pre-Anesthesia Assessment:                    - Prior to the procedure, a History and Physical was                     performed, and patient medications, allergies and                     sensitivities were reviewed. The patient's tolerance of                     previous anesthesia was reviewed.                    After obtaining informed consent, the endoscope was passed                     under direct vision. Throughout the procedure, the                     patient's blood pressure, pulse, and oxygen saturations                     were monitored continuously. The Endoscope was introduced                     through the mouth, and advanced to the second part of                     duodenum. The upper GI endoscopy was accomplished without                     difficulty. The patient tolerated the procedure well. Findings:      The esophagus was normal.      Vary large amount of clotted blood and cofffee ground color blood was       found in the gastric body. Unable to remove clot or see source of       bleeding.      The examined duodenum was normal. Impression:        - Normal esophagus.                    - Very large amount of clotted blood in the gastric body.                     Unable to see source of bleeding under clot.                    - Normal  examined duodenum.                    -  No specimens collected. Recommendation:    - Observe patient in GI recovery unit.                    - NPO.                    - Monitor Hgb closely                    - Cont PPI driip                    - start reglan 5 mg q8hr to increase motility.                    - Repeat endoscopy once clot cleared.                    - Consider vascular surgery consult for further bleeding                     overrnight since source cannot be seen endoscopically.                    - Continue present medications.                    - The findings and recommendations were discussed with the                     patient.                    - The findings and recommendations were discussed with the                     patient's family. Procedure Code(s): --- Professional ---                    [id number], Esophagogastroduodenoscopy, flexible, transoral;                     diagnostic, including collection of specimen(s) by                     brushing or washing, when performed (separate procedure) Diagnosis Code(s): --- Professional ---                    K92.2, Gastrointestinal hemorrhage, unspecified                    D62, Acute posthemorrhagic anemia                    K92.0, Hematemesis                    K92.1, [place] CPT copyright 2014 American Medical Association. All rights reserved. The codes documented in this report are preliminary and upon coder review may  be revised to meet current compliance requirements. Mellody Life, MD 12/08/2015 5:55:11 PM This report has been signed electronically. Number of Addenda: 0 Note Initiated On: 12/08/2015 5:24 PM      [facility]

## 2015-12-08 NOTE — Progress Notes (Signed)
Pt refused to wear Cpap. Nurse aware.

## 2015-12-08 NOTE — Progress Notes (Signed)
Received report from [employer] in ED.

## 2015-12-09 ENCOUNTER — Encounter (HOSPITAL_COMMUNITY): Payer: Self-pay | Admitting: *Deleted

## 2015-12-09 ENCOUNTER — Inpatient Hospital Stay (HOSPITAL_COMMUNITY)
Admission: AD | Admit: 2015-12-09 | Discharge: 2015-12-13 | DRG: 300 | Disposition: E | Payer: Medicare Other | Source: Other Acute Inpatient Hospital | Attending: Internal Medicine | Admitting: Internal Medicine

## 2015-12-09 ENCOUNTER — Inpatient Hospital Stay (HOSPITAL_COMMUNITY): Payer: Medicare Other

## 2015-12-09 DIAGNOSIS — Z9889 Other specified postprocedural states: Secondary | ICD-10-CM | POA: Diagnosis not present

## 2015-12-09 DIAGNOSIS — I772 Rupture of artery: Secondary | ICD-10-CM | POA: Diagnosis present

## 2015-12-09 DIAGNOSIS — R578 Other shock: Secondary | ICD-10-CM | POA: Diagnosis present

## 2015-12-09 DIAGNOSIS — G629 Polyneuropathy, unspecified: Secondary | ICD-10-CM | POA: Diagnosis present

## 2015-12-09 DIAGNOSIS — Z66 Do not resuscitate: Secondary | ICD-10-CM | POA: Diagnosis present

## 2015-12-09 DIAGNOSIS — J45909 Unspecified asthma, uncomplicated: Secondary | ICD-10-CM

## 2015-12-09 DIAGNOSIS — Z803 Family history of malignant neoplasm of breast: Secondary | ICD-10-CM | POA: Diagnosis not present

## 2015-12-09 DIAGNOSIS — G8929 Other chronic pain: Secondary | ICD-10-CM

## 2015-12-09 DIAGNOSIS — D123 Benign neoplasm of transverse colon: Secondary | ICD-10-CM | POA: Diagnosis present

## 2015-12-09 DIAGNOSIS — K921 Melena: Secondary | ICD-10-CM | POA: Diagnosis present

## 2015-12-09 DIAGNOSIS — I272 Other secondary pulmonary hypertension: Secondary | ICD-10-CM | POA: Diagnosis present

## 2015-12-09 DIAGNOSIS — R579 Shock, unspecified: Secondary | ICD-10-CM | POA: Diagnosis present

## 2015-12-09 DIAGNOSIS — K5900 Constipation, unspecified: Secondary | ICD-10-CM | POA: Diagnosis present

## 2015-12-09 DIAGNOSIS — I719 Aortic aneurysm of unspecified site, without rupture: Secondary | ICD-10-CM | POA: Diagnosis present

## 2015-12-09 DIAGNOSIS — E662 Morbid (severe) obesity with alveolar hypoventilation: Secondary | ICD-10-CM | POA: Diagnosis present

## 2015-12-09 DIAGNOSIS — R571 Hypovolemic shock: Secondary | ICD-10-CM | POA: Diagnosis present

## 2015-12-09 DIAGNOSIS — Z96649 Presence of unspecified artificial hip joint: Secondary | ICD-10-CM | POA: Diagnosis present

## 2015-12-09 DIAGNOSIS — G2581 Restless legs syndrome: Secondary | ICD-10-CM | POA: Diagnosis present

## 2015-12-09 DIAGNOSIS — K922 Gastrointestinal hemorrhage, unspecified: Secondary | ICD-10-CM | POA: Diagnosis present

## 2015-12-09 DIAGNOSIS — R1031 Right lower quadrant pain: Secondary | ICD-10-CM

## 2015-12-09 DIAGNOSIS — J449 Chronic obstructive pulmonary disease, unspecified: Secondary | ICD-10-CM | POA: Diagnosis present

## 2015-12-09 DIAGNOSIS — K920 Hematemesis: Secondary | ICD-10-CM | POA: Diagnosis present

## 2015-12-09 DIAGNOSIS — D62 Acute posthemorrhagic anemia: Secondary | ICD-10-CM | POA: Diagnosis present

## 2015-12-09 DIAGNOSIS — Z515 Encounter for palliative care: Secondary | ICD-10-CM | POA: Diagnosis present

## 2015-12-09 DIAGNOSIS — E785 Hyperlipidemia, unspecified: Secondary | ICD-10-CM | POA: Diagnosis present

## 2015-12-09 DIAGNOSIS — I10 Essential (primary) hypertension: Secondary | ICD-10-CM | POA: Diagnosis present

## 2015-12-09 DIAGNOSIS — G4733 Obstructive sleep apnea (adult) (pediatric): Secondary | ICD-10-CM | POA: Diagnosis present

## 2015-12-09 DIAGNOSIS — D500 Iron deficiency anemia secondary to blood loss (chronic): Secondary | ICD-10-CM | POA: Diagnosis not present

## 2015-12-09 DIAGNOSIS — Z96653 Presence of artificial knee joint, bilateral: Secondary | ICD-10-CM | POA: Diagnosis present

## 2015-12-09 DIAGNOSIS — T82898A Other specified complication of vascular prosthetic devices, implants and grafts, initial encounter: Secondary | ICD-10-CM | POA: Diagnosis not present

## 2015-12-09 DIAGNOSIS — I951 Orthostatic hypotension: Secondary | ICD-10-CM

## 2015-12-09 DIAGNOSIS — K625 Hemorrhage of anus and rectum: Secondary | ICD-10-CM | POA: Diagnosis not present

## 2015-12-09 DIAGNOSIS — Z7982 Long term (current) use of aspirin: Secondary | ICD-10-CM | POA: Diagnosis not present

## 2015-12-09 DIAGNOSIS — C569 Malignant neoplasm of unspecified ovary: Secondary | ICD-10-CM | POA: Diagnosis present

## 2015-12-09 DIAGNOSIS — Z90710 Acquired absence of both cervix and uterus: Secondary | ICD-10-CM | POA: Diagnosis not present

## 2015-12-09 DIAGNOSIS — Z79899 Other long term (current) drug therapy: Secondary | ICD-10-CM | POA: Diagnosis not present

## 2015-12-09 DIAGNOSIS — F419 Anxiety disorder, unspecified: Secondary | ICD-10-CM | POA: Diagnosis present

## 2015-12-09 DIAGNOSIS — N170 Acute kidney failure with tubular necrosis: Secondary | ICD-10-CM | POA: Diagnosis present

## 2015-12-09 DIAGNOSIS — Z888 Allergy status to other drugs, medicaments and biological substances status: Secondary | ICD-10-CM | POA: Diagnosis not present

## 2015-12-09 DIAGNOSIS — Z6841 Body Mass Index (BMI) 40.0 and over, adult: Secondary | ICD-10-CM

## 2015-12-09 DIAGNOSIS — I959 Hypotension, unspecified: Secondary | ICD-10-CM

## 2015-12-09 DIAGNOSIS — G5793 Unspecified mononeuropathy of bilateral lower limbs: Secondary | ICD-10-CM | POA: Diagnosis present

## 2015-12-09 DIAGNOSIS — N390 Urinary tract infection, site not specified: Secondary | ICD-10-CM | POA: Diagnosis present

## 2015-12-09 LAB — CBC
HCT: 25.6 % — ABNORMAL LOW (ref 35.0–47.0)
HCT: 26.4 % — ABNORMAL LOW (ref 36.0–46.0)
HEMATOCRIT: 30 % — AB (ref 36.0–46.0)
HEMOGLOBIN: 8.7 g/dL — AB (ref 12.0–15.0)
HEMOGLOBIN: 10 g/dL — AB (ref 12.0–15.0)
Hemoglobin: 8.6 g/dL — ABNORMAL LOW (ref 12.0–16.0)
MCH: 28.7 pg (ref 26.0–34.0)
MCH: 29.3 pg (ref 26.0–34.0)
MCH: 29.8 pg (ref 26.0–34.0)
MCHC: 33.4 g/dL (ref 32.0–36.0)
MCHC: 33 g/dL (ref 30.0–36.0)
MCHC: 33.3 g/dL (ref 30.0–36.0)
MCV: 86 fL (ref 80.0–100.0)
MCV: 88.9 fL (ref 78.0–100.0)
MCV: 89.3 fL (ref 78.0–100.0)
PLATELETS: 171 10*3/uL (ref 150–440)
RBC: 2.98 MIL/uL — AB (ref 3.80–5.20)
RBC: 2.97 MIL/uL — AB (ref 3.87–5.11)
RBC: 3.36 MIL/uL — ABNORMAL LOW (ref 3.87–5.11)
RDW: 14.7 % — ABNORMAL HIGH (ref 11.5–14.5)
RDW: 14 % (ref 11.5–15.5)
RDW: 13.8 % (ref 11.5–15.5)
WBC: 9.8 10*3/uL (ref 3.6–11.0)
WBC: 7.9 10*3/uL (ref 4.0–10.5)
WBC: 18.2 10*3/uL — ABNORMAL HIGH (ref 4.0–10.5)

## 2015-12-09 LAB — COMPREHENSIVE METABOLIC PANEL
ALK PHOS: 36 U/L — AB (ref 38–126)
ALT: 12 U/L — AB (ref 14–54)
ANION GAP: 6 (ref 5–15)
AST: 22 U/L (ref 15–41)
Albumin: 1.8 g/dL — ABNORMAL LOW (ref 3.5–5.0)
BILIRUBIN TOTAL: 0.8 mg/dL (ref 0.3–1.2)
BUN: 27 mg/dL — ABNORMAL HIGH (ref 6–20)
CALCIUM: 6.8 mg/dL — AB (ref 8.9–10.3)
CO2: 24 mmol/L (ref 22–32)
CREATININE: 1.06 mg/dL — AB (ref 0.44–1.00)
Chloride: 109 mmol/L (ref 101–111)
GFR, EST AFRICAN AMERICAN: 56 mL/min — AB (ref >60)
GFR, EST NON AFRICAN AMERICAN: 49 mL/min — AB (ref >60)
Glucose, Bld: 145 mg/dL — ABNORMAL HIGH (ref 65–99)
Potassium: 3.7 mmol/L (ref 3.5–5.1)
SODIUM: 139 mmol/L (ref 135–145)
TOTAL PROTEIN: 4.1 g/dL — AB (ref 6.5–8.1)

## 2015-12-09 LAB — GLUCOSE, CAPILLARY: GLUCOSE-CAPILLARY: 125 mg/dL — AB (ref 65–99)

## 2015-12-09 LAB — BASIC METABOLIC PANEL
Anion gap: 5 (ref 5–15)
BUN: 40 mg/dL — AB (ref 6–20)
CALCIUM: 7.4 mg/dL — AB (ref 8.9–10.3)
CHLORIDE: 103 mmol/L (ref 101–111)
CO2: 29 mmol/L (ref 22–32)
CREATININE: 1.14 mg/dL — AB (ref 0.44–1.00)
GFR calc non Af Amer: 45 mL/min — ABNORMAL LOW (ref >60)
GFR, EST AFRICAN AMERICAN: 52 mL/min — AB (ref >60)
GLUCOSE: 108 mg/dL — AB (ref 65–99)
Potassium: 3.3 mmol/L — ABNORMAL LOW (ref 3.5–5.1)
Sodium: 137 mmol/L (ref 135–145)

## 2015-12-09 LAB — PREPARE RBC (CROSSMATCH)

## 2015-12-09 LAB — MRSA PCR SCREENING: MRSA BY PCR: NEGATIVE

## 2015-12-09 LAB — PROTIME-INR
INR: 1.25 (ref 0.00–1.49)
Prothrombin Time: 15.8 seconds — ABNORMAL HIGH (ref 11.6–15.2)

## 2015-12-09 LAB — TROPONIN I: TROPONIN I: 0.04 ng/mL — AB (ref <0.031)

## 2015-12-09 LAB — HEMOGLOBIN AND HEMATOCRIT, BLOOD
HCT: 24 % — ABNORMAL LOW (ref 35.0–47.0)
Hemoglobin: 7.7 g/dL — ABNORMAL LOW (ref 12.0–16.0)

## 2015-12-09 LAB — APTT: aPTT: 30 seconds (ref 24–37)

## 2015-12-09 LAB — LACTIC ACID, PLASMA: LACTIC ACID, VENOUS: 1 mmol/L (ref 0.5–2.0)

## 2015-12-09 MED ORDER — LORAZEPAM 2 MG/ML IJ SOLN
1.0000 mg | INTRAMUSCULAR | Status: DC | PRN
  Administered 2015-12-09: 1 mg via INTRAVENOUS
  Filled 2015-12-09: qty 1

## 2015-12-09 MED ORDER — ONDANSETRON HCL 4 MG/2ML IJ SOLN
4.0000 mg | Freq: Four times a day (QID) | INTRAMUSCULAR | Status: DC | PRN
  Administered 2015-12-09: 4 mg via INTRAVENOUS
  Filled 2015-12-09: qty 2

## 2015-12-09 MED ORDER — SODIUM CHLORIDE 0.9 % IV SOLN: Freq: Once | INTRAVENOUS | Status: DC

## 2015-12-09 MED ORDER — ONDANSETRON HCL 4 MG PO TABS: 4.0000 mg | ORAL_TABLET | Freq: Four times a day (QID) | ORAL | Status: DC | PRN

## 2015-12-09 MED ORDER — MORPHINE BOLUS VIA INFUSION
2.0000 mg | INTRAVENOUS | Status: DC | PRN
  Filled 2015-12-09: qty 2

## 2015-12-09 MED ORDER — MORPHINE SULFATE (PF) 4 MG/ML IV SOLN
INTRAVENOUS | Status: AC
  Filled 2015-12-09: qty 1

## 2015-12-09 MED ORDER — ALBUTEROL SULFATE (2.5 MG/3ML) 0.083% IN NEBU: 3.0000 mL | INHALATION_SOLUTION | Freq: Four times a day (QID) | RESPIRATORY_TRACT | Status: DC | PRN

## 2015-12-09 MED ORDER — PROMETHAZINE HCL 25 MG/ML IJ SOLN
12.5000 mg | Freq: Four times a day (QID) | INTRAMUSCULAR | Status: DC | PRN
  Administered 2015-12-09: 12.5 mg via INTRAVENOUS
  Filled 2015-12-09: qty 1

## 2015-12-09 MED ORDER — SODIUM CHLORIDE 0.9 % IV SOLN
8.0000 mg/h | INTRAVENOUS | Status: DC
  Filled 2015-12-09 (×2): qty 80

## 2015-12-09 MED ORDER — MORPHINE SULFATE (PF) 2 MG/ML IV SOLN
INTRAVENOUS | Status: AC
  Administered 2015-12-09: 1 mg via INTRAVENOUS
  Filled 2015-12-09: qty 1

## 2015-12-09 MED ORDER — MORPHINE SULFATE (PF) 2 MG/ML IV SOLN
1.0000 mg | INTRAVENOUS | Status: DC | PRN
Start: 2015-12-09 — End: 2015-12-10
  Administered 2015-12-09 (×3): 1 mg via INTRAVENOUS
  Filled 2015-12-09 (×2): qty 1

## 2015-12-09 MED ORDER — PANTOPRAZOLE SODIUM 40 MG IV SOLR: 40.0000 mg | Freq: Two times a day (BID) | INTRAVENOUS | Status: DC

## 2015-12-09 MED ORDER — OXYCODONE HCL ER 10 MG PO T12A: 10.0000 mg | EXTENDED_RELEASE_TABLET | Freq: Every day | ORAL | Status: DC

## 2015-12-09 MED ORDER — PANTOPRAZOLE SODIUM 40 MG IV SOLR
80.0000 mg | Freq: Once | INTRAVENOUS | Status: AC
  Administered 2015-12-09: 80 mg via INTRAVENOUS
  Filled 2015-12-09: qty 80

## 2015-12-09 MED ORDER — MORPHINE SULFATE (PF) 4 MG/ML IV SOLN
4.0000 mg | Freq: Once | INTRAVENOUS | Status: AC
  Administered 2015-12-09: 4 mg via INTRAVENOUS

## 2015-12-09 MED ORDER — IOHEXOL 350 MG/ML SOLN
100.0000 mL | Freq: Once | INTRAVENOUS | Status: AC | PRN
  Administered 2015-12-09: 100 mL via INTRAVENOUS

## 2015-12-09 MED ORDER — MORPHINE SULFATE (PF) 2 MG/ML IV SOLN
1.0000 mg | Freq: Once | INTRAVENOUS | Status: AC
  Administered 2015-12-09: 1 mg via INTRAVENOUS
  Filled 2015-12-09: qty 1

## 2015-12-09 MED ORDER — ALBUTEROL SULFATE (2.5 MG/3ML) 0.083% IN NEBU: 2.5000 mg | INHALATION_SOLUTION | RESPIRATORY_TRACT | Status: DC | PRN

## 2015-12-09 MED ORDER — LORAZEPAM 2 MG/ML IJ SOLN: 0.5000 mg | INTRAMUSCULAR | Status: DC | PRN

## 2015-12-09 MED ORDER — SODIUM CHLORIDE 0.9 % IV SOLN: 8.0000 mg/h | INTRAVENOUS | Status: AC

## 2015-12-09 MED ORDER — PREGABALIN 100 MG PO CAPS: 100.0000 mg | ORAL_CAPSULE | Freq: Three times a day (TID) | ORAL | Status: DC

## 2015-12-09 MED ORDER — BUDESONIDE-FORMOTEROL FUMARATE 160-4.5 MCG/ACT IN AERO
2.0000 | INHALATION_SPRAY | Freq: Two times a day (BID) | RESPIRATORY_TRACT | Status: DC
  Filled 2015-12-09: qty 6

## 2015-12-09 MED ORDER — SODIUM CHLORIDE 0.9 % IV SOLN
INTRAVENOUS | Status: DC
Start: 2015-12-09 — End: 2015-12-10
  Administered 2015-12-09: 75 mL/h via INTRAVENOUS

## 2015-12-09 MED ORDER — SODIUM CHLORIDE 0.9 % IV SOLN: 75.0000 mL | INTRAVENOUS | Status: AC

## 2015-12-09 MED ORDER — SODIUM CHLORIDE 0.9 % IV SOLN
1.0000 mg/h | INTRAVENOUS | Status: DC
  Administered 2015-12-09: 1 mg/h via INTRAVENOUS
  Filled 2015-12-09: qty 10

## 2015-12-09 MED ORDER — PAROXETINE HCL 20 MG PO TABS: 20.0000 mg | ORAL_TABLET | Freq: Every day | ORAL | Status: DC

## 2015-12-09 MED ORDER — CIPROFLOXACIN HCL 500 MG PO TABS: 500.0000 mg | ORAL_TABLET | Freq: Two times a day (BID) | ORAL | Status: DC

## 2015-12-09 MED ORDER — MORPHINE SULFATE (PF) 2 MG/ML IV SOLN: 2.0000 mg | Freq: Once | INTRAVENOUS | Status: DC

## 2015-12-09 MED ORDER — SENNOSIDES-DOCUSATE SODIUM 8.6-50 MG PO TABS: 1.0000 | ORAL_TABLET | Freq: Every evening | ORAL | Status: DC | PRN

## 2015-12-09 MED ORDER — PRAMIPEXOLE DIHYDROCHLORIDE 0.25 MG PO TABS
0.2500 mg | ORAL_TABLET | ORAL | Status: DC | PRN
  Filled 2015-12-09: qty 1

## 2015-12-09 MED ORDER — MORPHINE SULFATE (PF) 2 MG/ML IV SOLN
INTRAVENOUS | Status: AC
  Administered 2015-12-09: 2 mg via INTRAVENOUS
  Filled 2015-12-09: qty 1

## 2015-12-09 MED ORDER — MORPHINE SULFATE (PF) 2 MG/ML IV SOLN
2.0000 mg | Freq: Once | INTRAVENOUS | Status: AC
  Administered 2015-12-09: 2 mg via INTRAVENOUS

## 2015-12-09 MED ORDER — BISACODYL 10 MG RE SUPP: 10.0000 mg | Freq: Every day | RECTAL | Status: DC | PRN

## 2015-12-09 MED ORDER — PREGABALIN 50 MG PO CAPS: 50.0000 mg | ORAL_CAPSULE | Freq: Two times a day (BID) | ORAL | Status: DC

## 2015-12-09 MED ORDER — SIMVASTATIN 20 MG PO TABS: 20.0000 mg | ORAL_TABLET | Freq: Every evening | ORAL | Status: DC

## 2015-12-09 NOTE — Progress Notes (Addendum)
BP 96/31

## 2015-12-09 NOTE — Progress Notes (Addendum)
Patient became nauseous with dry heaves, uncontrolled bowel movements copious amounts of blood and clots.

## 2015-12-09 NOTE — Progress Notes (Signed)
Pt still actively bleeding . Copious amount of dark black blood from rectum.dr Marcille Blanco in the room. rn asked if pt was appropriate to stay on floor. md reports yes because her bp and  Pulse are stable

## 2015-12-09 NOTE — Progress Notes (Addendum)
Pt found breathless and pulseless at 2316, confirmed with Corie Chiquito, RN. Pt spouse, daughter, and sister at bedside at time of passing, called daughter/LuAnn to notify of pt passing. Rogue Bussing (o/c for Dr Sheran Fava) notified of pt passing. Requested [facility] in [place]. [facility] called, pt is not a candidate for donation ok to remove body. Post mortem care completed and pt transported to morgue  Approx 231ml of Morphine wasted in sink with Landry Dyke, RN

## 2015-12-09 NOTE — Significant Event (Signed)
Rapid Response Event Note  Overview: called for rapid response, pt with gib, up to BR felt "nauseous and whoozy", had incontinent accident.       Initial Focused Assessment: pt alert and oriented x4, vss, bp 116/49, HR- 79. Dr Rosilyn Mings already on the scene, ordered 1 unit pc's and 1 liter bolus.   Interventions: 1 liter bolus, 1 unit PC, will stay on floor until further notice   Event Summary:   at      at          [place]

## 2015-12-09 NOTE — Progress Notes (Signed)
Rapid response called.

## 2015-12-09 NOTE — Progress Notes (Signed)
Dr Verdell Carmine paged by Wilder Glade RN. Waiting on callback.

## 2015-12-09 NOTE — Discharge Summary (Signed)
[facility] [place] NAME: Lisa Becker    MR#:  [id number]  DATE OF BIRTH:  June 10, 1934  DATE OF ADMISSION:  12/08/2015 ADMITTING PHYSICIAN: Bettey Costa, MD  DATE OF DISCHARGE: 11/30/2015  PRIMARY CARE PHYSICIAN: Rusty Aus., MD    ADMISSION DIAGNOSIS:  Upper GI bleed [K92.2]  DISCHARGE DIAGNOSIS:  Active Problems:   GIB (gastrointestinal bleeding)   GI bleed   SECONDARY DIAGNOSIS:   Past Medical History  Diagnosis Date  . Hypertension   . Anemia   . Sleep apnea     CPAP in use q night   . Pulmonary hypertension ([place])     followed by Dr. Raul Del, [facility], PFT's done at that last visit- mid April- 2015  . Shortness of breath   . Depression   . Nervous indigestion     uses one TUM, if this occurs (maybe once per month)    . Arthritis     lumbar stenosis  . Neuromuscular disorder (HCC)     neuropathy hands, feet, legs   . Cancer ([place]) 2010    ovarian, reoccurence - 2013, treatment completed through porta cath. 2014   . Asthma     HOSPITAL COURSE:   82 year old female with past medical history of ovarian cancer with recurrence, asthma, osteoarthritis, depression, chronic pain, tension, obstructive sleep apnea, who presents to the hospital with hematemesis and noted to have an upper GI bleed.  #1 upper GI bleed-this is the cause of patient's hematemesis and acute blood loss anemia. -Patient was admitted to the medical floor and then transferred to the intensive care unit due to hemodynamic instability and some relative hypotension. Patient currently is getting IV fluids, transfusion and her hemoglobin is labile. -She underwent upper GI endoscopy which showed a lot of blood in the gastric fundus with no acute source of the bleeding was notified. There is a suspicion for patient having a penetrating gastric ulcer but that is not confirmed. Patient needs a vascular surgery consult with embolization or even interventional  radiology to embolize the source of her bleeding. These services are currently not available here and therefore she is being transferred to [facility] for further care. -For now continue supportive care with IV fluids, blood transfusions and follow hemodynamics. -Continue to hold aspirin. Continue Protonix drip.  #2 shock-hypovolemic shock due to blood loss and anemia. -Continue IV fluids, blood transfusions. Follow hemodynamics. Hold antihypertensives.  #3 acute renal failure-ATN due to hypotension and acute blood loss anemia. -Continue IV fluids, blood transfusions and follow BUN and creatinine.  #4 COPD-without acute exacerbation. -Continue Symbicort.  #5 anxiety-continue Paxil.  #6 neuropathy-continue Lyrica.  #7 restless leg syndrome-continue Mirapex.  #8 chronic pain-continue OxyContin.  #9 Metastatic Ovarian Cancer w/ recurrence - follows w/ Dr. Oliva Bustard.  Does not want treatment at this time.    She is being urgently transferred to [facility] for further care. This was discussed with the patient and she is in agreement.  DISCHARGE CONDITIONS:   Guarded  CONSULTS OBTAINED:  Treatment Team:  Josefine Class, MD  DRUG ALLERGIES:   Allergies  Allergen Reactions  . Amlodipine Swelling    "leg swelling"  . Metoclopramide Other (See Comments)    "interferes with my pramipexole"  . Metronidazole Diarrhea    "upset stomach"  . Tramadol Hcl Er Other (See Comments)    "hold fluid"    DISCHARGE MEDICATIONS:   Current Discharge Medication List    START  taking these medications   Details  pantoprazole 80 mg in sodium chloride 0.9 % 250 mL Inject 8 mg/hr into the vein continuous.    sodium chloride 0.9 % infusion Inject 75 mLs into the vein continuous. Refills: 0      CONTINUE these medications which have NOT CHANGED   Details  albuterol (PROVENTIL HFA;VENTOLIN HFA) 108 (90 BASE) MCG/ACT inhaler Inhale 2 puffs into the lungs every 6 (six)  hours as needed for wheezing or shortness of breath.    Alpha-Lipoic Acid (LIPOIC ACID PO) Take 300 mg by mouth every evening.     budesonide-formoterol (SYMBICORT) 160-4.5 MCG/ACT inhaler Inhale 2 puffs into the lungs 2 (two) times daily.    calcium carbonate (TUMS - DOSED IN MG ELEMENTAL CALCIUM) 500 MG chewable tablet Chew 1 tablet by mouth daily.    Cholecalciferol (VITAMIN D3) 2000 UNITS TABS Take 2,000 Units by mouth daily with breakfast.     ciprofloxacin (CIPRO) 500 MG tablet Take 1 tablet (500 mg total) by mouth 2 (two) times daily. Qty: 14 tablet, Refills: 0    Coenzyme Q10-Fish Oil-Vit E (CO-Q 10 OMEGA-3 FISH OIL) CAPS Take 1 capsule by mouth 2 (two) times daily.     Iron-Vitamin C (VITRON-C PO) Take 1 tablet by mouth daily with breakfast.     Magnesium Oxide 250 MG TABS Take 1 tablet by mouth daily.    ondansetron (ZOFRAN) 4 MG tablet Take 4 mg by mouth every 6 (six) hours as needed for nausea.    oxyCODONE (OXYCONTIN) 10 mg 12 hr tablet Take 1 tablet (10 mg total) by mouth daily after supper. Qty: 15 tablet, Refills: 0   Associated Diagnoses: Ovarian cancer, unspecified laterality ([place]); Pain with urination    PARoxetine (PAXIL) 20 MG tablet Take 20 mg by mouth daily.   Associated Diagnoses: Ovarian cancer, unspecified laterality (HCC)    pramipexole (MIRAPEX) 0.25 MG tablet Take 0.25 mg by mouth at bedtime as needed and may repeat dose one time if needed (restless leg).     pregabalin (LYRICA) 100 MG capsule Take 100 mg by mouth 3 (three) times daily.    Associated Diagnoses: Ovarian cancer, unspecified laterality ([place]); Pain with urination    simvastatin (ZOCOR) 20 MG tablet Take 20 mg by mouth every evening.    vitamin B-12 (CYANOCOBALAMIN) 1000 MCG tablet Take 1,000 mcg by mouth daily with breakfast.     oxyCODONE (ROXICODONE) 5 MG immediate release tablet Take 1 tablet (5 mg total) by mouth every 6 (six) hours as needed for moderate pain or severe pain. Qty: 4  tablet, Refills: 0      STOP taking these medications     aspirin EC 81 MG tablet      torsemide (DEMADEX) 20 MG tablet      triamterene-hydrochlorothiazide (MAXZIDE-25) 37.5-25 MG per tablet          DISCHARGE INSTRUCTIONS:   DIET:  Nothing by mouth  DISCHARGE CONDITION:  Serious  ACTIVITY:  Activity as tolerated  OXYGEN:  Home Oxygen: No.   Oxygen Delivery: room air  DISCHARGE LOCATION:  [facility] hospital   If you experience worsening of your admission symptoms, develop shortness of breath, life threatening emergency, suicidal or homicidal thoughts you must seek medical attention immediately by calling 911 or calling your MD immediately  if symptoms less severe.  You Must read complete instructions/literature along with all the possible adverse reactions/side effects for all the Medicines you take and that have been prescribed to you.  Take any new Medicines after you have completely understood and accpet all the possible adverse reactions/side effects.   Please note  You were cared for by a hospitalist during your hospital stay. If you have any questions about your discharge medications or the care you received while you were in the hospital after you are discharged, you can call the unit and asked to speak with the hospitalist on call if the hospitalist that took care of you is not available. Once you are discharged, your primary care physician will handle any further medical issues. Please note that NO REFILLS for any discharge medications will be authorized once you are discharged, as it is imperative that you return to your primary care physician (or establish a relationship with a primary care physician if you do not have one) for your aftercare needs so that they can reassess your need for medications and monitor your lab values.     Today   Pt. Is complaining of some RUQ abdominal pain.  No nausea, vomiting, chest pain, shortness of breath.   VITAL SIGNS:   Blood pressure 82/43, pulse 84, temperature 98.5 F (36.9 C), temperature source Axillary, resp. rate 11, height 5\' 2"  (1.575 m), weight 103.012 kg (227 lb 1.6 oz), SpO2 100 %.  I/O:   Intake/Output Summary (Last 24 hours) at 12/11/2015 0949 Last data filed at 12/08/2015 0452  Gross per 24 hour  Intake 2260.17 ml  Output      0 ml  Net 2260.17 ml    PHYSICAL EXAMINATION:  GENERAL:  82 y.o.-year-old pale appearing patient lying in the bed in no acute distress.  EYES: Pupils equal, round, reactive to light and accommodation. No scleral icterus. Extraocular muscles intact. Pale conjunctiva HEENT: Head atraumatic, normocephalic. Oropharynx and nasopharynx clear.  NECK:  Supple, no jugular venous distention. No thyroid enlargement, no tenderness.  LUNGS: Normal breath sounds bilaterally, no wheezing, rales,rhonchi. No use of accessory muscles of respiration.  CARDIOVASCULAR: S1, S2 normal. No murmurs, rubs, or gallops.  ABDOMEN: Soft, non-tender, non-distended. Bowel sounds present. No organomegaly or mass.  EXTREMITIES: No pedal edema, cyanosis, or clubbing.  NEUROLOGIC: Cranial nerves II through XII are intact. No focal motor or sensory defecits b/l. Globally weak  PSYCHIATRIC: The patient is alert and oriented x 3. Good affect.  SKIN: No obvious rash, lesion, or ulcer.   DATA REVIEW:   CBC  Recent Labs Lab 12/03/2015 0320 12/12/2015 0721  WBC 9.8  --   HGB 8.6* 7.7*  HCT 25.6* 24.0*  PLT 171  --     Chemistries   Recent Labs Lab 12/08/15 1155 12/07/2015 0320  NA 139 137  K 3.6 3.3*  CL 95* 103  CO2 32 29  GLUCOSE 147* 108*  BUN 35* 40*  CREATININE 1.26* 1.14*  CALCIUM 8.8* 7.4*  AST 27  --   ALT 18  --   ALKPHOS 61  --   BILITOT 0.5  --     Cardiac Enzymes No results for input(s): TROPONINI in the last 168 hours.  Microbiology Results  Results for orders placed or performed in visit on 12/05/15  Urine culture     Status: None   Collection Time: 12/05/15 12:39  PM  Result Value Ref Range Status   Specimen Description Urine  Final   Special Requests NONE  Final   Culture MULTIPLE SPECIES PRESENT, SUGGEST RECOLLECTION  Final   Report Status 12/07/2015 FINAL  Final    RADIOLOGY:  No results found.  Management plans discussed with the patient, family and they are in agreement.  CODE STATUS:     Code Status Orders        Start     Ordered   12/08/15 [id number]  Do not attempt resuscitation (DNR)   Continuous    Question Answer Comment  In the event of cardiac or respiratory ARREST Do not call a "code blue"   In the event of cardiac or respiratory ARREST Do not perform Intubation, CPR, defibrillation or ACLS   In the event of cardiac or respiratory ARREST Use medication by any route, position, wound care, and other measures to relive pain and suffering. May use oxygen, suction and manual treatment of airway obstruction as needed for comfort.      12/08/15 1515    Code Status History    Date Active Date Inactive Code Status Order ID Comments User Context   03/31/2015  8:08 AM 04/05/2015  4:32 PM DNR [id number]  Rusty Aus, MD Inpatient   03/31/2015  5:55 AM 03/31/2015  8:08 AM Full Code [id number]  Harrie Foreman, MD Inpatient   05/08/2014  1:39 PM 05/12/2014  6:11 PM Full Code [id number]  Newman Pies, MD Inpatient   05/05/2014  3:39 PM 05/07/2014  7:21 PM Full Code [id number]  Faythe Ghee, MD Inpatient   04/05/2014  9:28 AM 04/06/2014  3:32 AM Full Code [id number]  Dereck Ligas, MD HOV    Advance Directive Documentation        Most Recent Value   Type of Advance Directive  Healthcare Power of Attorney, Living will   Pre-existing out of facility DNR order (yellow form or pink MOST form)     "MOST" Form in Place?        TOTAL TIME TAKING CARE OF THIS PATIENT: 45 minutes.    Henreitta Leber M.D on 12/08/2015 at 9:49 AM  Between 7am to 6pm - Pager - [phone]  After 6pm go to [email or web address] - password [email or web address] [facility]  [place]  [facility]  Office  [phone]  CC: Primary care physician; Rusty Aus., MD

## 2015-12-09 NOTE — Progress Notes (Signed)
Floor nurse spoke with Dr Rayann Heman, will transfer pt as stepdown to icu.

## 2015-12-09 NOTE — Progress Notes (Signed)
Shadava Clarizio (patient's daughter) called and notified of rapid response with patient.

## 2015-12-09 NOTE — Progress Notes (Signed)
Received call from rapid response nurse at 1545 stating a code blue was call on pt while in [place]. She stated she was told pt was unresponsive and did not have a pulse. No CPR or medications given due to DNR. She stated pt was arousing when she got to CT and that she would be bringing pt back to 3300. Consuelo Pandy RN

## 2015-12-09 NOTE — Progress Notes (Signed)
Dr. Gladstone Lighter and Dr. Rayann Heman paged.

## 2015-12-09 NOTE — Progress Notes (Signed)
Pt given two units PRBC emergently per MD. Pt given IV morphine for abdominal pain. Pt alert and oriented. Daughter notified and states she is coming back to the hospital. Consuelo Pandy RN

## 2015-12-09 NOTE — H&P (Signed)
[facility] History and Physical  Lisa Becker [id number] DOB: 1934-05-22 DOA: 12/11/2015  Referring physician: Emergency Department PCP: Rusty Aus., MD   CHIEF COMPLAINT:                   HPI: Lisa Becker is a 82 y.o. female transferred from [place] today for uncontrolled upper GI bleed. Patient has past medical history not limited to recurrent / progressive ovarian cancer, HTN, morbid obesity , OSA and pulmonary HTN.   Yesterday after returning home from shopping patient had an episode of hematemesis which she described the emesis as looking like chocolate. Second episode consisted of bright red blood. Since then patient has been having some intermittent hematochezia.  In ED yesterday patient was hypotensive, hemoglobin 9.8, BUN 35. Patient was admitted and underwent upper endoscopy which showed blood in the gastric fundus but no source of bleeding. She was transfused a unit of blood Hemoglobin fell further to 9.  Hemoglobin this a.m 7.7.   No history of peptic ulcer disease. Patient takes a baby aspirin, no other NSAIDs prior to the acute symptoms patient had no nausea vomiting or upper abdominal pain. She has been struggling with constipation. Patient complains of lower abdominal pain present for a few months. The pain occurs when lying down at night. Patient underwent colonoscopy for evaluation of this pain.   Labs:   Current hgb 7.7, MCV 86, BUN 40, creatinine 1.14, potassium 3.3  Urinalysis:    Clear, negative leukocytes                EKG:    Sinus rhythm Ventricular premature complex Low voltage, extremity and precordial leads Consider anterior infarct Nonspecific T abnormalities, lateral leads           QT 372        Review of Systems  Constitutional: Negative.   HENT: Negative.   Eyes: Negative.   Respiratory: Negative.   Cardiovascular: Negative.   Gastrointestinal: Positive for vomiting, constipation, blood in stool and melena.  Genitourinary:  Negative.   Musculoskeletal: Negative.   Skin: Negative.   Neurological: Negative.   Endo/Heme/Allergies: Negative.   Psychiatric/Behavioral: Negative.     Past Medical History  Diagnosis Date  . Hypertension   . Anemia   . Sleep apnea     CPAP in use q night   . Pulmonary hypertension ([place])     followed by Dr. Raul Del, Jefm Bryant, PFT's done at that last visit- mid April- 2015  . Shortness of breath   . Depression   . Nervous indigestion     uses one TUM, if this occurs (maybe once per month)    . Arthritis     lumbar stenosis  . Neuromuscular disorder (HCC)     neuropathy hands, feet, legs   . Cancer ([place]) 2010    ovarian, reoccurence - 2013, treatment completed through porta cath. 2014   . Asthma    Past Surgical History  Procedure Laterality Date  . Total hip arthroplasty  2013  . Knee surgery Left 2006  . Eus  08/20/2012    Procedure: UPPER ENDOSCOPIC ULTRASOUND (EUS) LINEAR;  Surgeon: Milus Banister, MD;  Location: [facility] ENDOSCOPY;  Service: Endoscopy;  Laterality: N/A;  radial/ linear   . Joint replacement Right     both knees have been replaced    . Tonsillectomy    . Abdominal hysterectomy    . Back surgery  2003    prior to 2003 episode, had  back surgery (lumbar)- 1989  . Laparotomy  2013    for mass in abdomen   . Blepharoplasty    . Colonoscopy with propofol N/A 06/09/2015    Procedure: COLONOSCOPY WITH PROPOFOL;  Surgeon: Hulen Luster, MD;  Location: [facility] ENDOSCOPY;  Service: Gastroenterology;  Laterality: N/A;    SOCIAL HISTORY:  reports that she has never smoked. She does not have any smokeless tobacco history on file. She reports that she does not drink alcohol or use illicit drugs. Lives: At home with husband    Assistive devices:   Rolling walker and cane needed for ambulation.   Allergies  Allergen Reactions  . Amlodipine Swelling    "leg swelling"  . Metoclopramide Other (See Comments)    "interferes with my pramipexole"  . Metronidazole Diarrhea      "upset stomach"  . Tramadol Hcl Er Other (See Comments)    "hold fluid"    Family History  Problem Relation Age of Onset  . Breast cancer Sister 8  . Breast cancer Maternal Aunt      Prior to Admission medications   Medication Sig Start Date End Date Taking? Authorizing Provider  albuterol (PROVENTIL HFA;VENTOLIN HFA) 108 (90 BASE) MCG/ACT inhaler Inhale 2 puffs into the lungs every 6 (six) hours as needed for wheezing or shortness of breath.    Historical Provider, MD  Alpha-Lipoic Acid (LIPOIC ACID PO) Take 300 mg by mouth every evening.     Historical Provider, MD  budesonide-formoterol (SYMBICORT) 160-4.5 MCG/ACT inhaler Inhale 2 puffs into the lungs 2 (two) times daily.    Historical Provider, MD  calcium carbonate (TUMS - DOSED IN MG ELEMENTAL CALCIUM) 500 MG chewable tablet Chew 1 tablet by mouth daily.    Historical Provider, MD  Cholecalciferol (VITAMIN D3) 2000 UNITS TABS Take 2,000 Units by mouth daily with breakfast.     Historical Provider, MD  ciprofloxacin (CIPRO) 500 MG tablet Take 1 tablet (500 mg total) by mouth 2 (two) times daily. 12/06/15   Forest Gleason, MD  Coenzyme Q10-Fish Oil-Vit E (CO-Q 10 OMEGA-3 FISH OIL) CAPS Take 1 capsule by mouth 2 (two) times daily.     Historical Provider, MD  Iron-Vitamin C (VITRON-C PO) Take 1 tablet by mouth daily with breakfast.     Historical Provider, MD  Magnesium Oxide 250 MG TABS Take 1 tablet by mouth daily.    Historical Provider, MD  ondansetron (ZOFRAN) 4 MG tablet Take 4 mg by mouth every 6 (six) hours as needed for nausea.    Historical Provider, MD  oxyCODONE (OXYCONTIN) 10 mg 12 hr tablet Take 1 tablet (10 mg total) by mouth daily after supper. 12/05/15   Forest Gleason, MD  oxyCODONE (ROXICODONE) 5 MG immediate release tablet Take 1 tablet (5 mg total) by mouth every 6 (six) hours as needed for moderate pain or severe pain. 05/30/15 05/29/16  Delman Kitten, MD  pantoprazole 80 mg in sodium chloride 0.9 % 250 mL Inject 8 mg/hr  into the vein continuous. 12/05/2015   Henreitta Leber, MD  PARoxetine (PAXIL) 20 MG tablet Take 20 mg by mouth daily.    Historical Provider, MD  pramipexole (MIRAPEX) 0.25 MG tablet Take 0.25 mg by mouth at bedtime as needed and may repeat dose one time if needed (restless leg).     Historical Provider, MD  pregabalin (LYRICA) 100 MG capsule Take 100 mg by mouth 3 (three) times daily.  11/07/15 12/08/15  Historical Provider, MD  simvastatin (ZOCOR) 20 MG  tablet Take 20 mg by mouth every evening.    Historical Provider, MD  sodium chloride 0.9 % infusion Inject 75 mLs into the vein continuous. 11/25/2015   Henreitta Leber, MD  vitamin B-12 (CYANOCOBALAMIN) 1000 MCG tablet Take 1,000 mcg by mouth daily with breakfast.     Historical Provider, MD   PHYSICAL EXAM: Filed Vitals:   12/08/2015 1217  BP: 113/46  Pulse: 69  Temp: 97.5 F (36.4 C)  TempSrc: Oral  Resp: 13  Height: 5' 1.5" (1.562 m)  Weight: 105.6 kg (232 lb 12.9 oz)  SpO2: 99%    Wt Readings from Last 3 Encounters:  11/28/2015 105.6 kg (232 lb 12.9 oz)  12/07/2015 103.012 kg (227 lb 1.6 oz)  12/05/15 102.059 kg (225 lb)    General:  Pleasant  morbidly obese white  female. Appears calm and comfortable Eyes: PER, normal lids, irises & conjunctiva ENT: grossly normal hearing, lips & tongue Neck: no LAD, no masses Cardiovascular: RRR, no murmurs. No LE edema.  Respiratory: Respirations even and unlabored. Normal respiratory effort. Lungs CTA bilaterally, no wheezes / rales .   Abdomen: soft, non-distended, non-tender, active bowel sounds. No obvious masses.  Skin: no rash seen on limited exam Musculoskeletal: grossly normal tone BUE/BLE Psychiatric: grossly normal mood and affect, speech fluent and appropriate Neurologic: grossly non-focal.         LABS ON ADMISSION:    Basic Metabolic Panel:  Recent Labs Lab 12/05/15 1215 12/08/15 1155 11/25/2015 0320  NA 134* 139 137  K 3.4* 3.6 3.3*  CL 94* 95* 103  CO2 30 32 29  GLUCOSE  107* 147* 108*  BUN 33* 35* 40*  CREATININE 1.07* 1.26* 1.14*  CALCIUM 8.9 8.8* 7.4*   Liver Function Tests:  Recent Labs Lab 12/05/15 1215 12/08/15 1155  AST 26 27  ALT 20 18  ALKPHOS 70 61  BILITOT 0.4 0.5  PROT 7.6 7.1  ALBUMIN 3.3* 3.2*    Recent Labs Lab 12/08/15 1155  LIPASE 229*    CBC:  Recent Labs Lab 12/05/15 1215 12/08/15 1113 12/08/15 1520 12/08/15 2210 12/04/2015 0320 11/16/2015 0721  WBC 8.2 13.2*  --   --  9.8  --   NEUTROABS 5.3  --   --   --   --   --   HGB 10.6* 9.8* 9.0* 7.0* 8.6* 7.7*  HCT 32.5* 30.5* 28.1* 21.7* 25.6* 24.0*  MCV 85.5 86.9  --   --  86.0  --   PLT 234 118*  --   --  171  --     CREATININE: 1.14 mg/dL ABNORMAL (11/17/2015 0320) Estimated creatinine clearance - 45.2 mL/min   ASSESSMENT / PLAN   Hemodynamically unstable upper gastrointestinal bleeding. She has been stablized. Normotensive now. EGD at [place] yesterday evening unable to localize bleeding as there was too much blood in the stomach. Patient has continued to bleed necessitating transfer here for possible embolization.  -Admit to stepdown -stat CBC now then follow Q 8 hours -Maintenance IV fluids -Interventional radiology aware of patient. Radiologist requesting CT angiogram of the abdomen and pelvis now. For possible embolization today -Keep nothing by mouth -Protonix drip -PT evaluation   Anemia of acute blood loss. Not clear how many total units she has received.  Stat H&H pending, she has just completed two units.    Hypertension. Stable. She has been hypotensive with bleeding.  -Hold any home anti-hypertensives -support with IV fluids and blood as needed  Obstructive apnea. Uses CPAP  machine -Will ask respiratory therapy to see  Chronic lower abdominal pain. Initially LLQ but not more diffuse and not noticeable in supine position. Patient has recurrent and progressive ovarian disease. Recently saw oncologist who is obtaining imaging studies to evaluate  her pain  Elevated Lipase. No upper pain. Significance unclear. Await CT scan    Ovarian cancer ([place]). Recurrent / progressive. Followed by Oncology. Not on treatment per her request.   Neuropathy. Involving both lower extremities -Continue on Lyrica, home narcotics    Asthma. Stable. Continue home inhalers  Pulmonary hypertension.   CONSULTANTS:   Interventional Radiology   Code Status: full code DVT Prophylaxis: SCDs Family Communication:  Patient alert, oriented and understands plan of care.  Disposition Plan: Discharge to home in 2-3 days   Time spent: 60 minutes Tye Savoy  NP [facility] Pager [phone]

## 2015-12-09 NOTE — Consult Note (Signed)
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 She has had recurrences and his stage IV metastatic. She was in her usual health yesterday when she had a GI bleed and was admitted to [facility]. She was transferred here today for further workup. She underwent a CT scan today and had cardiac and respiratory decompensation during the procedure but was resuscitated successfully. She is now alert and oriented back in her room with her daughter present. CT scan showed aorta enteric fistula. I am here to discuss the ramifications of this and what options are available if any. She does not wish any heroic measures and is a DO NOT RESUSCITATE related to her metastatic ovarian cancer.  Past Medical History  Diagnosis Date  . Hypertension   . Anemia   . Sleep apnea     CPAP in use q night   . Pulmonary hypertension ([place])     followed by Dr. Raul Del, Jefm Bryant, PFT's done at that last visit- mid April- 2015  . Shortness of breath   . Depression   . Nervous indigestion     uses one TUM, if this occurs (maybe once per month)    . Arthritis     lumbar stenosis  . Neuromuscular disorder (HCC)     neuropathy hands, feet, legs   . Cancer ([place]) 2010    ovarian, reoccurence - 2013, treatment completed through porta cath. 2014   . Asthma     Past Surgical History  Procedure Laterality Date  . Total hip arthroplasty  2013  . Knee surgery Left 2006  . Eus  08/20/2012    Procedure: UPPER ENDOSCOPIC ULTRASOUND (EUS) LINEAR;  Surgeon: Milus Banister, MD;  Location: [facility] ENDOSCOPY;  Service: Endoscopy;  Laterality: N/A;  radial/ linear   . Joint replacement Right     both knees have been replaced    . Tonsillectomy    . Abdominal hysterectomy    . Back  surgery  2003    prior to 2003 episode, had back surgery (lumbar)- 1989  . Laparotomy  2013    for mass in abdomen   . Blepharoplasty    . Colonoscopy with propofol N/A 06/09/2015    Procedure: COLONOSCOPY WITH PROPOFOL;  Surgeon: Hulen Luster, MD;  Location: [facility] ENDOSCOPY;  Service: Gastroenterology;  Laterality: N/A;    Social History   Social History  . Marital Status: Married    Spouse Name: N/A  . Number of Children: N/A  . Years of Education: N/A   Occupational History  . Not on file.   Social History Main Topics  . Smoking status: Never Smoker   . Smokeless tobacco: Not on file  . Alcohol Use: No  . Drug Use: No  . Sexual Activity: Not on file   Other Topics Concern  . Not on file   Social History Narrative   Lives with husband who is not able to help her w/ ADL's; she is able to ambulate w/ rolling walker    Family History  Problem Relation Age of Onset  . Breast cancer Sister 75  . Breast cancer Maternal Aunt     Allergies as of 12/01/2015 - Review Complete 11/29/2015  Allergen Reaction Noted  . Amlodipine Swelling 05/02/2014  . Metoclopramide Other (  See Comments) 05/02/2014  . Metronidazole Diarrhea 05/02/2014  . Tramadol hcl er Other (See Comments) 05/02/2014    Current Facility-Administered Medications on File Prior to Encounter  Medication Dose Route Frequency Provider Last Rate Last Dose  . sodium chloride 0.9 % injection 10 mL  10 mL Intravenous PRN Forest Gleason, MD   10 mL at 05/31/15 [id number]   Current Outpatient Prescriptions on File Prior to Encounter  Medication Sig Dispense Refill  . albuterol (PROVENTIL HFA;VENTOLIN HFA) 108 (90 BASE) MCG/ACT inhaler Inhale 2 puffs into the lungs every 6 (six) hours as needed for wheezing or shortness of breath.    . Alpha-Lipoic Acid (LIPOIC ACID PO) Take 300 mg by mouth every evening.     . budesonide-formoterol (SYMBICORT) 160-4.5 MCG/ACT inhaler Inhale 2 puffs into the lungs 2 (two) times daily.    . calcium  carbonate (TUMS - DOSED IN MG ELEMENTAL CALCIUM) 500 MG chewable tablet Chew 1 tablet by mouth daily.    . Cholecalciferol (VITAMIN D3) 2000 UNITS TABS Take 2,000 Units by mouth daily with breakfast.     . ciprofloxacin (CIPRO) 500 MG tablet Take 1 tablet (500 mg total) by mouth 2 (two) times daily. 14 tablet 0  . Coenzyme Q10-Fish Oil-Vit E (CO-Q 10 OMEGA-3 FISH OIL) CAPS Take 1 capsule by mouth 2 (two) times daily.     . Iron-Vitamin C (VITRON-C PO) Take 1 tablet by mouth daily with breakfast.     . Magnesium Oxide 250 MG TABS Take 1 tablet by mouth daily.    Marland Kitchen oxyCODONE (OXYCONTIN) 10 mg 12 hr tablet Take 1 tablet (10 mg total) by mouth daily after supper. 15 tablet 0  . PARoxetine (PAXIL) 20 MG tablet Take 20 mg by mouth daily.    . pramipexole (MIRAPEX) 0.25 MG tablet Take 0.25 mg by mouth as needed (restless leg).     . simvastatin (ZOCOR) 20 MG tablet Take 20 mg by mouth every evening.    . vitamin B-12 (CYANOCOBALAMIN) 1000 MCG tablet Take 1,000 mcg by mouth daily with breakfast.     . ondansetron (ZOFRAN) 4 MG tablet Take 4 mg by mouth every 6 (six) hours as needed for nausea. Reported on 11/16/2015    . pantoprazole 80 mg in sodium chloride 0.9 % 250 mL Inject 8 mg/hr into the vein continuous.    . pregabalin (LYRICA) 100 MG capsule Take 100 mg by mouth 3 (three) times daily.     . sodium chloride 0.9 % infusion Inject 75 mLs into the vein continuous.  0     REVIEW OF SYSTEMS: Reviewed in her history and physical with nothing to add  PHYSICAL EXAMINATION:  General: The patient is a well-nourished female, in no acute distress. Has had some pain medication but answers questions appropriately Vital signs are BP 93/39 mmHg  Pulse 80  Temp(Src) 97.5 F (36.4 C) (Oral)  Resp 14  Ht 5' 1.5" (1.562 m)  Wt 232 lb 12.9 oz (105.6 kg)  BMI 43.28 kg/m2  SpO2 100% Pulmonary: There is a good air exchange Abdomen: Soft and non-tender with normal pitch bowel sounds. sculoskeletal: There  are no major deformities.  There is no significant extremity pain. Neurologic: No focal weakness or paresthesias are detected, Skin: There are no ulcer or rashes noted. Psychiatric: The patient has normal affect.   I reviewed her CT scan from today and compared this to her study from November 2016. She had a large metastatic mass proximal 5 cm which had  expanded from his prior imaging on her November 2016 study. This is felt to represent a metastatic disease in the area anterior to her aorta. On today's study this is clearly developed an aortoenteric fistula with air in this area and also an aortic pseudoaneurysm.  Impression and Plan:  I discussed this finding with the patient and her daughter present. Explained that this would be very difficult to treat. Explain the only treatment option would be emergent exploratory laparotomy with resection of her aorta and oversewing of this if possible. Also explained that the GI track would have to be resected and an attempt made to restore continuity despite the metastatic disease present. Explained that she would require extra anatomic bypass. She and her family have no interest in this aggressive treatment and I do feel that she would have a very high risk for mortality associated with this and this would approach futile care. Patient understands and will be made comfortable with consultation with hospice and palliative care. Will not follow actively. Please call if I can provide any assistance    Brynli Ollis [facility] Office: [phone]

## 2015-12-09 NOTE — Progress Notes (Signed)
Spoke to Dr. Rayann Heman who agreed to send to ICU. Feels she has an active bleed and said he would consult with vascular to embolize. Patient continues to have copious amounts of blood from the rectum.

## 2015-12-09 NOTE — Progress Notes (Signed)
Pt arrived from [place] , daughter at bedside. Pt alert and oriented and denies pain. Removed pt's earrings (1 pair) and 3 rings, gave them to daughter to take home. Pt has clothes , cell phone with charger at bedside. MD notified of pt's arrival. Consuelo Pandy RN

## 2015-12-09 NOTE — Progress Notes (Signed)
Code blue called around 1530, patient was in CT scan.  As per CT tech, patient had been talking and in her usual state of health when they took her off the CT scan table and back to bed when she became unresponsive and no pulse was felt, so Code blue called.  As staff was cutting bra and applying zoll pads, patient woke up and has been moaning and crying out in pain, pain located lower right abd.  Patient is very pale, HR 88, NRB 100% applied.  Bailey Mech , RN primary Rn called and updated on status.  Assisted with transfer back to 3 S.  On arrival to unit SBp 60's.  Dr. Sheran Fava at bedside.  Ns bolus started and emergent blood started.

## 2015-12-09 NOTE — Progress Notes (Addendum)
Dr. Marcille Blanco notified of change in pt. Pt nauseated with increased weakness and pallor. Pt unable to control bowel. Bleeding per rectum. Dr. Marcille Blanco to see pt at bedside.

## 2015-12-09 NOTE — Progress Notes (Signed)
DR. Arna Medici paged by unit clerk

## 2015-12-09 NOTE — Progress Notes (Signed)
Chaplain responded to a rapid response.  Lisa Becker [phone]

## 2015-12-09 NOTE — Progress Notes (Addendum)
Patient BP 116/49.

## 2015-12-09 NOTE — Progress Notes (Signed)
Called due to patient having LOC after her CT angiogram with hypotension.  No chest compressions were performed but patient had spontaneous return of circulation and was brought back to the stepdown unit where she was given IVF boluses and 2 units of blood emergently.  Her blood pressure improved from 0000000 systolic to [id number].  Findings of CT were discussed with radiologist who was concerned about a pseudoaneurysm at the bifurcation of the aorta adjacent to a gas-containing soft tissue mass, presumably the same enlarging soft tissue mass seen on staging CTs done by her oncologist.  General surgery and vascular surgery were both notified and findings were discussed with patient and her family member who was at bedside.  The patient at this time does not want to undergo any surgeries and would like to be comfortable.  Dr. Donnetta Hutching is coming to discuss the case with them now.  We discussed no further blood transfusions and comfort measures.

## 2015-12-09 NOTE — Progress Notes (Signed)
Dr. Verdell Carmine paged by CCU RN

## 2015-12-10 DIAGNOSIS — I772 Rupture of artery: Principal | ICD-10-CM

## 2015-12-11 ENCOUNTER — Telehealth: Payer: Self-pay | Admitting: *Deleted

## 2015-12-11 DIAGNOSIS — R578 Other shock: Secondary | ICD-10-CM

## 2015-12-11 DIAGNOSIS — I772 Rupture of artery: Secondary | ICD-10-CM

## 2015-12-11 LAB — TYPE AND SCREEN
ABO/RH(D): B POS
ABO/RH(D): B POS
Antibody Screen: NEGATIVE
Antibody Screen: NEGATIVE
UNIT DIVISION: 0
UNIT DIVISION: 0
UNIT DIVISION: 0
UNIT DIVISION: 0
Unit division: 0
Unit division: 0
Unit division: 0
Unit division: 0
Unit division: 0

## 2015-12-11 LAB — GLUCOSE, CAPILLARY: GLUCOSE-CAPILLARY: 132 mg/dL — AB (ref 65–99)

## 2015-12-11 LAB — PREPARE RBC (CROSSMATCH)

## 2015-12-13 NOTE — Telephone Encounter (Signed)
Patient's daughter called to inform of [place] patient's death.  Wanted to thank [place] personally for the care given to her mother.

## 2015-12-13 NOTE — Discharge Summary (Signed)
Death Summary  Lisa Becker [id number] DOB: 1934-05-14 DOA: 2015/12/23  PCP: Rusty Aus., MD  Admit date: 23-Dec-2015 Date of Death: 2015/12/23  Final Diagnoses:  Principal Problem:   Aortoenteric fistula (HCC) Active Problems:   Ovarian cancer (HCC)   Neuropathy involving both lower extremities (HCC)   Obstructive apnea   Pulmonary hypertension (HCC)   Gastrointestinal bleeding   Hypotension   Asthma   Abdominal pain, chronic, right lower quadrant   Anemia, blood loss   Hemorrhagic shock   History of present illness:   The patient is a 82 year old female with history of progressive ovarian cancer, hypertension, morbid obesity, sleep apnea with pulmonary hypertension. She presented to [facility] after she had multiple episodes of hematemesis where she underwent upper endoscopy which demonstrated blood in the gastric fundus but no obvious source of bleeding. Gastroenterologist at [facility] spoke with the interventional radiologist at [facility] for consideration of possible arterial embolization and she was transferred for this procedure. She was somewhat hypotensive and received several units of blood prior to transfer.     Hospital Course:   Aortoenteric fistula:  After she arrived at [facility], she was transported to CT for CT angiogram abdomen and pelvis to identify the source of her GI hemorrhage. Just as the last images were being completed, she collapsed and had a loss of pulse. Because she was DO NOT RESUSCITATE/DO NOT INTUBATE, no chest compressions were performed. She spontaneously revived however and started moaning. She was transferred back to the stepdown unit where she was found to be hypotensive to the 60s over 30s, moaning, and very pale. She was started on fluid boluses and she was given 2 units of emergent blood pending lab work. Her blood pressure improved and her mentation improved. She was given pain medication for severe right lower  quadrant pain. Her CT scan was discussed with the radiologist who felt that a mass that had previously been present just anterior to the distal aorta at the bifurcation was starting to erode into the aorta. The anterior portion of the mass appeared to be communicating with the bowel and there was a pseudoaneurysm of the aorta at the place where the mass was adjacent to the vessel. General surgery and vascular surgery were notified urgently and the findings were discussed with the patient. Because of risk of infection, the patient surgery to correct the pseudoaneurysm and the progressive malignancy would be quite invasive and will require a long recovery should she survive. The patient and her family elected no surgery. She essentially had an aortic enteric fistula that may have tamponaded briefly, but she died a few hours later of hemorrhagic shock.    Hemorrhagic shock. I was not clear how many total units she has received prior to transfer. She was given two units urgently after returning from CT, then transitioned to comfort measures and no further blood transfusions were administered.  Hypertension at baseline, but hypotensive with bleeding. Home medications were held.    Acute on chronic lower abdominal pain. Initially LLQ but became acute RLQ and suprapubic after CT scan.  She was started on morphine gtt to assist with pain management  Elevated Lipase. No upper pain.  Likely related to vomiting and ovarian cancer   Ovarian cancer ([place]). Recurrent / progressive. Followed by Oncology. Not on treatment per her request after her malignancy progressed.  Neuropathy. Involving both lower extremities.  She continued Lyrica   Asthma. Stable. Continue home inhalers  Pulmonary hypertension.  Time: 10 minutes  Signed:  Vallarie Fei  [facility] 12/11/2015, 5:26 PM

## 2015-12-13 DEATH — deceased

## 2016-02-26 ENCOUNTER — Ambulatory Visit: Payer: Medicare Other | Admitting: Oncology

## 2016-02-26 ENCOUNTER — Other Ambulatory Visit: Payer: Medicare Other

## 2017-07-04 IMAGING — CT NM PET TUM IMG RESTAG (PS) SKULL BASE T - THIGH
1 of 10 series · 1 of 25 positions shown · non-contrast
Comparison: Abdominal pelvic CT 03/31/2015.  PET-CT 09/26/2014

CLINICAL DATA: Subsequent treatment strategy for ovarian cancer
treated with chemotherapy in 4836.

EXAM:
NUCLEAR MEDICINE PET SKULL BASE TO THIGH
TECHNIQUE: 12.95 mCi F-18 FDG was injected intravenously. Full-ring PET imaging
was performed from the skull base to thigh after the radiotracer. CT
data was obtained and used for attenuation correction and anatomic
localization.
FASTING BLOOD GLUCOSE:  Value: 93 mg/dl

[Series 3: pet wb (ac) · axial · 5.0mm · 4.07mm/px · 1 of 290 slices shown]
[im 145/290]
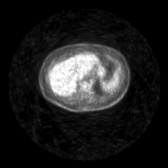

[1 of 25 positions shown; findings below may reference images not displayed]

FINDINGS: NECK

There is a new hypermetabolic posterior left supraclavicular lymph
node measuring 6 mm on image 60. This has an SUV max of 3.5. No
other cervical hypermetabolic nodal activity demonstrated.There are
no lesions of the pharyngeal mucosal space. Left IJ Port-A-Cath tip
is in the lower SVC.

CHEST

There are no hypermetabolic mediastinal, hilar or axillary lymph
nodes. No suspicious pulmonary activity or nodularity.
Atherosclerosis of the aorta, great vessels and coronary arteries
again noted.

ABDOMEN/PELVIS

The hepatic activity is mildly heterogeneous without definite focal
lesion. No abnormal activity demonstrated within the spleen,
pancreas or adrenal glands. The pancreas is diffusely atrophied.
cm nodule or fluid collection within the pancreatic head on image
170 does not have any abnormal metabolic activity. Again
demonstrated are several hypermetabolic retroperitoneal lymph nodes.
The largest nodes include a portacaval node measuring 1.4 x 1.4 cm
on image 161 (SUV max 7.5) and a 4.5 x 2.4 cm preaortic node on
image 189 (SUV max 14.3). The largest node is similar in size to the
prior PET-CT at which time it had an SUV max 16.7. There is a 10 mm
right common iliac node on image 203 which has an SUV max of 8.6.

SKELETON

There is no hypermetabolic activity to suggest osseous metastatic
disease. There are extensive postsurgical changes within the lumbar
spine status post laminectomy and fusion. There is bilateral
hypermetabolic activity within the sacrum corresponding with
bilateral sacral insufficiency fractures. No underlying lytic lesion
identified.
IMPRESSION: 1. Persistent hypermetabolic retroperitoneal lymphadenopathy,
dominant node similar to prior PET-CT from 8 months ago. Current
study does demonstrate several small hypermetabolic nodes as well.
2. New hypermetabolic left supraclavicular lymph node worrisome for
metastatic disease.
3. Bilateral sacral insufficiency fractures.
